# Patient Record
Sex: Female | Born: 1955 | Race: White | Hispanic: No | Marital: Married | Smoking: Never smoker
Health system: Southern US, Community
[De-identification: ages and names within clinical notes are randomized; demographics above are authoritative.]

## PROBLEM LIST (undated history)

## (undated) DIAGNOSIS — N189 Chronic kidney disease, unspecified: Secondary | ICD-10-CM

## (undated) DIAGNOSIS — E669 Obesity, unspecified: Secondary | ICD-10-CM

## (undated) DIAGNOSIS — F32A Depression, unspecified: Secondary | ICD-10-CM

## (undated) DIAGNOSIS — F319 Bipolar disorder, unspecified: Secondary | ICD-10-CM

## (undated) DIAGNOSIS — F329 Major depressive disorder, single episode, unspecified: Secondary | ICD-10-CM

## (undated) DIAGNOSIS — K219 Gastro-esophageal reflux disease without esophagitis: Secondary | ICD-10-CM

## (undated) DIAGNOSIS — Z87440 Personal history of urinary (tract) infections: Secondary | ICD-10-CM

## (undated) DIAGNOSIS — M1990 Unspecified osteoarthritis, unspecified site: Secondary | ICD-10-CM

## (undated) HISTORY — PX: HERNIA REPAIR: SHX51

## (undated) HISTORY — PX: ABDOMINAL HYSTERECTOMY: SHX81

## (undated) HISTORY — PX: SHOULDER SURGERY: SHX246

## (undated) HISTORY — DX: Chronic kidney disease, unspecified: N18.9

## (undated) HISTORY — DX: Major depressive disorder, single episode, unspecified: F32.9

## (undated) HISTORY — DX: Gastro-esophageal reflux disease without esophagitis: K21.9

## (undated) HISTORY — DX: Depression, unspecified: F32.A

## (undated) HISTORY — DX: Bipolar disorder, unspecified: F31.9

## (undated) HISTORY — PX: APPENDECTOMY: SHX54

## (undated) HISTORY — DX: Obesity, unspecified: E66.9

## (undated) HISTORY — DX: Personal history of urinary (tract) infections: Z87.440

---

## 1999-06-01 ENCOUNTER — Inpatient Hospital Stay (HOSPITAL_COMMUNITY): Admission: RE | Admit: 1999-06-01 | Discharge: 1999-06-03 | Payer: Self-pay | Admitting: *Deleted

## 2004-05-23 ENCOUNTER — Ambulatory Visit: Payer: Self-pay | Admitting: Psychiatry

## 2004-07-27 ENCOUNTER — Ambulatory Visit: Payer: Self-pay | Admitting: Psychiatry

## 2004-09-21 ENCOUNTER — Ambulatory Visit: Payer: Self-pay | Admitting: Psychiatry

## 2004-12-07 ENCOUNTER — Ambulatory Visit: Payer: Self-pay | Admitting: Psychiatry

## 2005-01-01 ENCOUNTER — Ambulatory Visit (HOSPITAL_BASED_OUTPATIENT_CLINIC_OR_DEPARTMENT_OTHER): Admission: RE | Admit: 2005-01-01 | Discharge: 2005-01-01 | Payer: Self-pay | Admitting: Orthopedic Surgery

## 2005-02-06 ENCOUNTER — Ambulatory Visit: Payer: Self-pay | Admitting: Psychiatry

## 2005-04-03 ENCOUNTER — Ambulatory Visit: Payer: Self-pay | Admitting: Psychiatry

## 2005-06-19 ENCOUNTER — Ambulatory Visit: Payer: Self-pay | Admitting: Psychology

## 2005-06-28 ENCOUNTER — Ambulatory Visit: Payer: Self-pay | Admitting: Psychiatry

## 2005-09-04 ENCOUNTER — Ambulatory Visit: Payer: Self-pay | Admitting: Psychology

## 2005-12-04 ENCOUNTER — Ambulatory Visit (HOSPITAL_COMMUNITY): Payer: Self-pay | Admitting: Psychiatry

## 2006-03-26 ENCOUNTER — Ambulatory Visit (HOSPITAL_COMMUNITY): Payer: Self-pay | Admitting: Psychiatry

## 2006-06-25 ENCOUNTER — Ambulatory Visit (HOSPITAL_COMMUNITY): Payer: Self-pay | Admitting: Psychiatry

## 2006-06-27 ENCOUNTER — Ambulatory Visit: Payer: Self-pay | Admitting: Gastroenterology

## 2006-07-15 ENCOUNTER — Ambulatory Visit (HOSPITAL_COMMUNITY): Admission: RE | Admit: 2006-07-15 | Discharge: 2006-07-15 | Payer: Self-pay | Admitting: Gastroenterology

## 2006-07-15 ENCOUNTER — Ambulatory Visit: Payer: Self-pay | Admitting: Gastroenterology

## 2006-08-21 ENCOUNTER — Ambulatory Visit: Payer: Self-pay | Admitting: Gastroenterology

## 2006-11-12 ENCOUNTER — Ambulatory Visit (HOSPITAL_COMMUNITY): Payer: Self-pay | Admitting: Psychiatry

## 2007-01-09 ENCOUNTER — Ambulatory Visit (HOSPITAL_COMMUNITY): Payer: Self-pay | Admitting: Psychiatry

## 2007-04-08 ENCOUNTER — Ambulatory Visit (HOSPITAL_COMMUNITY): Payer: Self-pay | Admitting: Psychiatry

## 2007-07-08 ENCOUNTER — Ambulatory Visit (HOSPITAL_COMMUNITY): Payer: Self-pay | Admitting: Psychiatry

## 2007-10-07 ENCOUNTER — Ambulatory Visit (HOSPITAL_COMMUNITY): Payer: Self-pay | Admitting: Psychiatry

## 2008-01-01 ENCOUNTER — Ambulatory Visit (HOSPITAL_COMMUNITY): Payer: Self-pay | Admitting: Psychiatry

## 2008-03-23 ENCOUNTER — Ambulatory Visit (HOSPITAL_COMMUNITY): Payer: Self-pay | Admitting: Psychiatry

## 2008-06-20 ENCOUNTER — Emergency Department (HOSPITAL_COMMUNITY): Admission: EM | Admit: 2008-06-20 | Discharge: 2008-06-20 | Payer: Self-pay | Admitting: Emergency Medicine

## 2008-06-29 ENCOUNTER — Ambulatory Visit: Admission: RE | Admit: 2008-06-29 | Discharge: 2008-06-29 | Payer: Self-pay | Admitting: Gynecologic Oncology

## 2008-07-06 ENCOUNTER — Inpatient Hospital Stay (HOSPITAL_COMMUNITY): Admission: RE | Admit: 2008-07-06 | Discharge: 2008-07-09 | Payer: Self-pay | Admitting: Obstetrics and Gynecology

## 2008-07-06 ENCOUNTER — Encounter: Payer: Self-pay | Admitting: Gynecologic Oncology

## 2008-07-06 ENCOUNTER — Encounter (INDEPENDENT_AMBULATORY_CARE_PROVIDER_SITE_OTHER): Payer: Self-pay | Admitting: Obstetrics and Gynecology

## 2008-08-17 ENCOUNTER — Ambulatory Visit: Admission: RE | Admit: 2008-08-17 | Discharge: 2008-08-17 | Payer: Self-pay | Admitting: Gynecologic Oncology

## 2008-11-16 ENCOUNTER — Ambulatory Visit (HOSPITAL_COMMUNITY): Payer: Self-pay | Admitting: Psychiatry

## 2009-02-15 ENCOUNTER — Ambulatory Visit (HOSPITAL_COMMUNITY): Payer: Self-pay | Admitting: Psychiatry

## 2009-04-13 ENCOUNTER — Emergency Department (HOSPITAL_COMMUNITY): Admission: EM | Admit: 2009-04-13 | Discharge: 2009-04-13 | Payer: Self-pay | Admitting: Emergency Medicine

## 2009-04-13 ENCOUNTER — Ambulatory Visit: Payer: Self-pay | Admitting: *Deleted

## 2009-04-13 ENCOUNTER — Inpatient Hospital Stay (HOSPITAL_COMMUNITY): Admission: RE | Admit: 2009-04-13 | Discharge: 2009-04-20 | Payer: Self-pay | Admitting: *Deleted

## 2009-04-22 ENCOUNTER — Ambulatory Visit: Payer: Self-pay | Admitting: Cardiology

## 2009-04-22 ENCOUNTER — Inpatient Hospital Stay (HOSPITAL_COMMUNITY): Admission: EM | Admit: 2009-04-22 | Discharge: 2009-05-11 | Payer: Self-pay | Admitting: Emergency Medicine

## 2009-04-25 ENCOUNTER — Encounter (INDEPENDENT_AMBULATORY_CARE_PROVIDER_SITE_OTHER): Payer: Self-pay | Admitting: Internal Medicine

## 2009-04-27 ENCOUNTER — Ambulatory Visit: Payer: Self-pay | Admitting: Internal Medicine

## 2009-04-30 ENCOUNTER — Encounter: Payer: Self-pay | Admitting: Internal Medicine

## 2009-05-11 ENCOUNTER — Inpatient Hospital Stay (HOSPITAL_COMMUNITY): Admission: RE | Admit: 2009-05-11 | Discharge: 2009-05-30 | Payer: Self-pay | Admitting: *Deleted

## 2009-05-11 ENCOUNTER — Ambulatory Visit: Payer: Self-pay | Admitting: *Deleted

## 2009-05-31 ENCOUNTER — Ambulatory Visit (HOSPITAL_COMMUNITY): Payer: Self-pay | Admitting: Psychiatry

## 2009-06-21 ENCOUNTER — Ambulatory Visit (HOSPITAL_COMMUNITY): Payer: Self-pay | Admitting: Psychiatry

## 2009-06-28 ENCOUNTER — Ambulatory Visit (HOSPITAL_COMMUNITY): Payer: Self-pay | Admitting: Psychiatry

## 2009-07-04 ENCOUNTER — Ambulatory Visit (HOSPITAL_COMMUNITY): Payer: Self-pay | Admitting: Psychiatry

## 2009-07-18 ENCOUNTER — Ambulatory Visit (HOSPITAL_COMMUNITY): Payer: Self-pay | Admitting: Psychiatry

## 2009-07-19 ENCOUNTER — Ambulatory Visit (HOSPITAL_COMMUNITY): Payer: Self-pay | Admitting: Psychiatry

## 2009-08-01 ENCOUNTER — Ambulatory Visit (HOSPITAL_COMMUNITY): Payer: Self-pay | Admitting: Psychiatry

## 2009-08-30 ENCOUNTER — Ambulatory Visit (HOSPITAL_COMMUNITY): Payer: Self-pay | Admitting: Psychiatry

## 2009-09-23 ENCOUNTER — Ambulatory Visit (HOSPITAL_COMMUNITY): Payer: Self-pay | Admitting: Psychiatry

## 2009-10-07 ENCOUNTER — Ambulatory Visit (HOSPITAL_COMMUNITY): Payer: Self-pay | Admitting: Psychiatry

## 2009-10-27 ENCOUNTER — Ambulatory Visit (HOSPITAL_COMMUNITY): Payer: Self-pay | Admitting: Psychiatry

## 2009-12-27 ENCOUNTER — Ambulatory Visit (HOSPITAL_COMMUNITY): Payer: Self-pay | Admitting: Psychiatry

## 2010-02-28 ENCOUNTER — Ambulatory Visit (HOSPITAL_COMMUNITY): Payer: Self-pay | Admitting: Psychiatry

## 2010-04-25 ENCOUNTER — Ambulatory Visit (HOSPITAL_COMMUNITY): Payer: Self-pay | Admitting: Psychiatry

## 2010-05-23 ENCOUNTER — Ambulatory Visit (HOSPITAL_COMMUNITY): Payer: Self-pay | Admitting: Psychiatry

## 2010-07-27 ENCOUNTER — Ambulatory Visit (HOSPITAL_COMMUNITY): Payer: Self-pay | Admitting: Psychiatry

## 2010-08-24 ENCOUNTER — Ambulatory Visit (HOSPITAL_COMMUNITY): Payer: Self-pay | Admitting: Psychiatry

## 2010-09-19 ENCOUNTER — Ambulatory Visit (HOSPITAL_COMMUNITY)
Admission: RE | Admit: 2010-09-19 | Discharge: 2010-09-19 | Payer: Self-pay | Source: Home / Self Care | Attending: Psychiatry | Admitting: Psychiatry

## 2010-10-31 ENCOUNTER — Encounter (INDEPENDENT_AMBULATORY_CARE_PROVIDER_SITE_OTHER): Payer: PRIVATE HEALTH INSURANCE | Admitting: Psychiatry

## 2010-10-31 DIAGNOSIS — F319 Bipolar disorder, unspecified: Secondary | ICD-10-CM

## 2010-12-01 LAB — POCT I-STAT 3, ART BLOOD GAS (G3+)
Acid-Base Excess: 4 mmol/L — ABNORMAL HIGH (ref 0.0–2.0)
Acid-base deficit: 4 mmol/L — ABNORMAL HIGH (ref 0.0–2.0)
Bicarbonate: 19.4 mEq/L — ABNORMAL LOW (ref 20.0–24.0)
Bicarbonate: 19.5 mEq/L — ABNORMAL LOW (ref 20.0–24.0)
Bicarbonate: 27.3 mEq/L — ABNORMAL HIGH (ref 20.0–24.0)
Bicarbonate: 29.6 mEq/L — ABNORMAL HIGH (ref 20.0–24.0)
O2 Saturation: 87 %
O2 Saturation: 91 %
O2 Saturation: 93 %
O2 Saturation: 95 %
Patient temperature: 98.1
Patient temperature: 98.2
TCO2: 20 mmol/L (ref 0–100)
pCO2 arterial: 28.7 mmHg — ABNORMAL LOW (ref 35.0–45.0)
pCO2 arterial: 29.5 mmHg — ABNORMAL LOW (ref 35.0–45.0)
pCO2 arterial: 30.1 mmHg — ABNORMAL LOW (ref 35.0–45.0)
pCO2 arterial: 34.7 mmHg — ABNORMAL LOW (ref 35.0–45.0)
pCO2 arterial: 38.9 mmHg (ref 35.0–45.0)
pCO2 arterial: 40.4 mmHg (ref 35.0–45.0)
pH, Arterial: 7.366 (ref 7.350–7.400)
pH, Arterial: 7.418 — ABNORMAL HIGH (ref 7.350–7.400)
pH, Arterial: 7.42 — ABNORMAL HIGH (ref 7.350–7.400)
pH, Arterial: 7.427 — ABNORMAL HIGH (ref 7.350–7.400)
pH, Arterial: 7.456 — ABNORMAL HIGH (ref 7.350–7.400)
pH, Arterial: 7.489 — ABNORMAL HIGH (ref 7.350–7.400)
pO2, Arterial: 48 mmHg — ABNORMAL LOW (ref 80.0–100.0)
pO2, Arterial: 52 mmHg — ABNORMAL LOW (ref 80.0–100.0)
pO2, Arterial: 53 mmHg — ABNORMAL LOW (ref 80.0–100.0)
pO2, Arterial: 63 mmHg — ABNORMAL LOW (ref 80.0–100.0)
pO2, Arterial: 70 mmHg — ABNORMAL LOW (ref 80.0–100.0)
pO2, Arterial: 72 mmHg — ABNORMAL LOW (ref 80.0–100.0)

## 2010-12-01 LAB — BASIC METABOLIC PANEL
BUN: 13 mg/dL (ref 6–23)
BUN: 14 mg/dL (ref 6–23)
BUN: 17 mg/dL (ref 6–23)
BUN: 5 mg/dL — ABNORMAL LOW (ref 6–23)
BUN: 8 mg/dL (ref 6–23)
CO2: 23 mEq/L (ref 19–32)
CO2: 29 mEq/L (ref 19–32)
CO2: 30 mEq/L (ref 19–32)
CO2: 30 mEq/L (ref 19–32)
CO2: 31 mEq/L (ref 19–32)
Calcium: 7.8 mg/dL — ABNORMAL LOW (ref 8.4–10.5)
Calcium: 8.1 mg/dL — ABNORMAL LOW (ref 8.4–10.5)
Calcium: 9.5 mg/dL (ref 8.4–10.5)
Chloride: 103 mEq/L (ref 96–112)
Chloride: 104 mEq/L (ref 96–112)
Chloride: 106 mEq/L (ref 96–112)
Chloride: 106 mEq/L (ref 96–112)
Chloride: 109 mEq/L (ref 96–112)
Chloride: 110 mEq/L (ref 96–112)
Creatinine, Ser: 0.88 mg/dL (ref 0.4–1.2)
Creatinine, Ser: 0.92 mg/dL (ref 0.4–1.2)
Creatinine, Ser: 0.93 mg/dL (ref 0.4–1.2)
Creatinine, Ser: 0.99 mg/dL (ref 0.4–1.2)
Creatinine, Ser: 1.06 mg/dL (ref 0.4–1.2)
GFR calc Af Amer: >60 mL/min (ref >60)
GFR calc Af Amer: >60 mL/min (ref >60)
GFR calc Af Amer: >60 mL/min (ref >60)
GFR calc Af Amer: >60 mL/min (ref >60)
GFR calc Af Amer: >60 mL/min (ref >60)
GFR calc non Af Amer: 54 mL/min — ABNORMAL LOW (ref >60)
GFR calc non Af Amer: >60 mL/min (ref >60)
GFR calc non Af Amer: >60 mL/min (ref >60)
Glucose, Bld: 127 mg/dL — ABNORMAL HIGH (ref 70–99)
Glucose, Bld: 145 mg/dL — ABNORMAL HIGH (ref 70–99)
Glucose, Bld: 168 mg/dL — ABNORMAL HIGH (ref 70–99)
Potassium: 2.2 mEq/L — CL (ref 3.5–5.1)
Potassium: 2.9 mEq/L — ABNORMAL LOW (ref 3.5–5.1)
Potassium: 2.9 mEq/L — ABNORMAL LOW (ref 3.5–5.1)
Potassium: 3 mEq/L — ABNORMAL LOW (ref 3.5–5.1)
Potassium: 3.5 mEq/L (ref 3.5–5.1)
Potassium: 4.4 mEq/L (ref 3.5–5.1)
Sodium: 138 mEq/L (ref 135–145)
Sodium: 139 mEq/L (ref 135–145)
Sodium: 143 mEq/L (ref 135–145)
Sodium: 144 mEq/L (ref 135–145)
Sodium: 145 mEq/L (ref 135–145)

## 2010-12-01 LAB — CBC
HCT: 28.9 % — ABNORMAL LOW (ref 36.0–46.0)
HCT: 29 % — ABNORMAL LOW (ref 36.0–46.0)
HCT: 30.2 % — ABNORMAL LOW (ref 36.0–46.0)
HCT: 32.1 % — ABNORMAL LOW (ref 36.0–46.0)
Hemoglobin: 10.1 g/dL — ABNORMAL LOW (ref 12.0–15.0)
Hemoglobin: 10.1 g/dL — ABNORMAL LOW (ref 12.0–15.0)
Hemoglobin: 10.1 g/dL — ABNORMAL LOW (ref 12.0–15.0)
Hemoglobin: 10.8 g/dL — ABNORMAL LOW (ref 12.0–15.0)
Hemoglobin: 9.9 g/dL — ABNORMAL LOW (ref 12.0–15.0)
Hemoglobin: 9.9 g/dL — ABNORMAL LOW (ref 12.0–15.0)
MCHC: 35.2 g/dL (ref 30.0–36.0)
MCHC: 33.5 g/dL (ref 30.0–36.0)
MCHC: 33.6 g/dL (ref 30.0–36.0)
MCHC: 34 g/dL (ref 30.0–36.0)
MCHC: 34 g/dL (ref 30.0–36.0)
MCV: 89.8 fL (ref 78.0–100.0)
MCV: 91.2 fL (ref 78.0–100.0)
MCV: 91.3 fL (ref 78.0–100.0)
MCV: 91.4 fL (ref 78.0–100.0)
MCV: 91.4 fL (ref 78.0–100.0)
MCV: 91.6 fL (ref 78.0–100.0)
MCV: 92.1 fL (ref 78.0–100.0)
Platelets: 181 10*3/uL (ref 150–400)
Platelets: 191 10*3/uL (ref 150–400)
Platelets: 435 10*3/uL — ABNORMAL HIGH (ref 150–400)
Platelets: 597 10*3/uL — ABNORMAL HIGH (ref 150–400)
RBC: 3.2 MIL/uL — ABNORMAL LOW (ref 3.87–5.11)
RBC: 3.15 MIL/uL — ABNORMAL LOW (ref 3.87–5.11)
RBC: 3.17 MIL/uL — ABNORMAL LOW (ref 3.87–5.11)
RBC: 3.18 MIL/uL — ABNORMAL LOW (ref 3.87–5.11)
RBC: 3.18 MIL/uL — ABNORMAL LOW (ref 3.87–5.11)
RBC: 3.27 MIL/uL — ABNORMAL LOW (ref 3.87–5.11)
RBC: 3.28 MIL/uL — ABNORMAL LOW (ref 3.87–5.11)
RBC: 3.51 MIL/uL — ABNORMAL LOW (ref 3.87–5.11)
RDW: 13.5 % (ref 11.5–15.5)
RDW: 13.6 % (ref 11.5–15.5)
RDW: 13.8 % (ref 11.5–15.5)
RDW: 14.1 % (ref 11.5–15.5)
WBC: 18.1 10*3/uL — ABNORMAL HIGH (ref 4.0–10.5)
WBC: 12.8 10*3/uL — ABNORMAL HIGH (ref 4.0–10.5)
WBC: 13.4 10*3/uL — ABNORMAL HIGH (ref 4.0–10.5)
WBC: 13.9 10*3/uL — ABNORMAL HIGH (ref 4.0–10.5)
WBC: 14.2 10*3/uL — ABNORMAL HIGH (ref 4.0–10.5)
WBC: 14.4 10*3/uL — ABNORMAL HIGH (ref 4.0–10.5)
WBC: 14.7 10*3/uL — ABNORMAL HIGH (ref 4.0–10.5)
WBC: 6.7 10*3/uL (ref 4.0–10.5)

## 2010-12-01 LAB — GLUCOSE, CAPILLARY
Glucose-Capillary: 104 mg/dL — ABNORMAL HIGH (ref 70–99)
Glucose-Capillary: 110 mg/dL — ABNORMAL HIGH (ref 70–99)
Glucose-Capillary: 112 mg/dL — ABNORMAL HIGH (ref 70–99)
Glucose-Capillary: 119 mg/dL — ABNORMAL HIGH (ref 70–99)
Glucose-Capillary: 127 mg/dL — ABNORMAL HIGH (ref 70–99)
Glucose-Capillary: 132 mg/dL — ABNORMAL HIGH (ref 70–99)
Glucose-Capillary: 133 mg/dL — ABNORMAL HIGH (ref 70–99)
Glucose-Capillary: 137 mg/dL — ABNORMAL HIGH (ref 70–99)
Glucose-Capillary: 138 mg/dL — ABNORMAL HIGH (ref 70–99)
Glucose-Capillary: 154 mg/dL — ABNORMAL HIGH (ref 70–99)
Glucose-Capillary: 156 mg/dL — ABNORMAL HIGH (ref 70–99)
Glucose-Capillary: 166 mg/dL — ABNORMAL HIGH (ref 70–99)
Glucose-Capillary: 169 mg/dL — ABNORMAL HIGH (ref 70–99)

## 2010-12-01 LAB — COMPREHENSIVE METABOLIC PANEL
ALT: 39 U/L — ABNORMAL HIGH (ref 0–35)
ALT: 59 U/L — ABNORMAL HIGH (ref 0–35)
AST: 41 U/L — ABNORMAL HIGH (ref 0–37)
Albumin: 1.7 g/dL — ABNORMAL LOW (ref 3.5–5.2)
BUN: 12 mg/dL (ref 6–23)
CO2: 22 mEq/L (ref 19–32)
CO2: 29 mEq/L (ref 19–32)
Calcium: 9 mg/dL (ref 8.4–10.5)
Chloride: 121 mEq/L — ABNORMAL HIGH (ref 96–112)
Chloride: 104 mEq/L (ref 96–112)
Creatinine, Ser: 0.89 mg/dL (ref 0.4–1.2)
Creatinine, Ser: 0.85 mg/dL (ref 0.4–1.2)
Creatinine, Ser: 0.97 mg/dL (ref 0.4–1.2)
GFR calc Af Amer: >60 mL/min (ref >60)
GFR calc Af Amer: >60 mL/min (ref >60)
GFR calc non Af Amer: 60 mL/min — ABNORMAL LOW (ref >60)
Glucose, Bld: 108 mg/dL — ABNORMAL HIGH (ref 70–99)
Potassium: 3.8 mEq/L (ref 3.5–5.1)
Sodium: 139 mEq/L (ref 135–145)
Total Bilirubin: 0.5 mg/dL (ref 0.3–1.2)
Total Bilirubin: 0.4 mg/dL (ref 0.3–1.2)
Total Protein: 6.8 g/dL (ref 6.0–8.3)

## 2010-12-01 LAB — BLOOD GAS, ARTERIAL
Acid-Base Excess: 5.1 mmol/L — ABNORMAL HIGH (ref 0.0–2.0)
Acid-Base Excess: 5.2 mmol/L — ABNORMAL HIGH (ref 0.0–2.0)
Acid-base deficit: 2.8 mmol/L — ABNORMAL HIGH (ref 0.0–2.0)
Acid-base deficit: 3.8 mmol/L — ABNORMAL HIGH (ref 0.0–2.0)
Bicarbonate: 19.1 mEq/L — ABNORMAL LOW (ref 20.0–24.0)
Bicarbonate: 28.2 mEq/L — ABNORMAL HIGH (ref 20.0–24.0)
Bicarbonate: 28.4 mEq/L — ABNORMAL HIGH (ref 20.0–24.0)
FIO2: 40 %
FIO2: 40 %
FIO2: 0.21 %
MECHVT: 650 mL
O2 Saturation: 92.3 %
O2 Saturation: 94.8 %
O2 Saturation: 89.5 %
PEEP: 5 cmH2O
Patient temperature: 37
Patient temperature: 98.6
Patient temperature: 98.6
RATE: 12 resp/min
RATE: 18 resp/min
TCO2: 18 mmol/L (ref 0–100)
TCO2: 29.2 mmol/L (ref 0–100)
TCO2: 29.5 mmol/L (ref 0–100)
pCO2 arterial: 36.4 mmHg (ref 35.0–45.0)
pH, Arterial: 7.503 — ABNORMAL HIGH (ref 7.350–7.400)
pH, Arterial: 7.525 — ABNORMAL HIGH (ref 7.350–7.400)
pO2, Arterial: 61.3 mmHg — ABNORMAL LOW (ref 80.0–100.0)
pO2, Arterial: 53.7 mmHg — ABNORMAL LOW (ref 80.0–100.0)

## 2010-12-01 LAB — PNEUMOCYSTIS JIROVECI SMEAR BY DFA

## 2010-12-01 LAB — DIFFERENTIAL
Basophils Absolute: 0 10*3/uL (ref 0.0–0.1)
Basophils Relative: 0 % (ref 0–1)
Basophils Relative: 1 % (ref 0–1)
Eosinophils Absolute: 0.2 10*3/uL (ref 0.0–0.7)
Eosinophils Absolute: 0.3 10*3/uL (ref 0.0–0.7)
Eosinophils Relative: 1 % (ref 0–5)
Eosinophils Relative: 3 % (ref 0–5)
Lymphocytes Relative: 5 % — ABNORMAL LOW (ref 12–46)
Lymphocytes Relative: 37 % (ref 12–46)
Lymphs Abs: 0.9 10*3/uL (ref 0.7–4.0)
Lymphs Abs: 1.6 10*3/uL (ref 0.7–4.0)
Lymphs Abs: 2.5 10*3/uL (ref 0.7–4.0)
Monocytes Absolute: 0.5 10*3/uL (ref 0.1–1.0)
Monocytes Absolute: 0.9 10*3/uL (ref 0.1–1.0)
Monocytes Relative: 3 % (ref 3–12)
Monocytes Relative: 13 % — ABNORMAL HIGH (ref 3–12)
Monocytes Relative: 4 % (ref 3–12)
Neutro Abs: 16.7 10*3/uL — ABNORMAL HIGH (ref 1.7–7.7)
Neutro Abs: 3.1 10*3/uL (ref 1.7–7.7)
Neutrophils Relative %: 92 % — ABNORMAL HIGH (ref 43–77)
Neutrophils Relative %: 47 % (ref 43–77)
Neutrophils Relative %: 83 % — ABNORMAL HIGH (ref 43–77)

## 2010-12-01 LAB — BODY FLUID CELL COUNT WITH DIFFERENTIAL
Eos, Fluid: 0 %
Lymphs, Fluid: 4 %
Monocyte-Macrophage-Serous Fluid: 6 % — ABNORMAL LOW (ref 50–90)
Neutrophil Count, Fluid: 90 % — ABNORMAL HIGH (ref 0–25)

## 2010-12-01 LAB — VIRUS CULTURE

## 2010-12-01 LAB — CLOSTRIDIUM DIFFICILE EIA

## 2010-12-01 LAB — MAGNESIUM
Magnesium: 1.9 mg/dL (ref 1.5–2.5)
Magnesium: 2.5 mg/dL (ref 1.5–2.5)

## 2010-12-01 LAB — CULTURE, RESPIRATORY W GRAM STAIN

## 2010-12-01 LAB — CARDIAC PANEL(CRET KIN+CKTOT+MB+TROPI)
CK, MB: 0.4 ng/mL (ref 0.3–4.0)
Total CK: 55 U/L (ref 7–177)
Troponin I: 0.06 ng/mL (ref 0.00–0.06)

## 2010-12-01 LAB — HIV ANTIBODY (ROUTINE TESTING W REFLEX): HIV: NONREACTIVE

## 2010-12-01 LAB — VALPROIC ACID LEVEL: Valproic Acid Lvl: 78.4 ug/mL (ref 50.0–100.0)

## 2010-12-01 LAB — PHOSPHORUS
Phosphorus: 3.3 mg/dL (ref 2.3–4.6)
Phosphorus: 3.6 mg/dL (ref 2.3–4.6)

## 2010-12-01 LAB — HSV PCR
HSV 2 , PCR: NOT DETECTED
HSV, PCR: DETECTED

## 2010-12-01 LAB — BRAIN NATRIURETIC PEPTIDE
Pro B Natriuretic peptide (BNP): 135 pg/mL — ABNORMAL HIGH (ref 0.0–100.0)
Pro B Natriuretic peptide (BNP): 107 pg/mL — ABNORMAL HIGH (ref 0.0–100.0)

## 2010-12-02 LAB — COMPREHENSIVE METABOLIC PANEL
ALT: 24 U/L (ref 0–35)
AST: 33 U/L (ref 0–37)
Albumin: 1.7 g/dL — ABNORMAL LOW (ref 3.5–5.2)
Albumin: 3.4 g/dL — ABNORMAL LOW (ref 3.5–5.2)
Alkaline Phosphatase: 67 U/L (ref 39–117)
Alkaline Phosphatase: 81 U/L (ref 39–117)
Alkaline Phosphatase: 83 U/L (ref 39–117)
BUN: 12 mg/dL (ref 6–23)
BUN: 14 mg/dL (ref 6–23)
CO2: 20 mEq/L (ref 19–32)
CO2: 20 mEq/L (ref 19–32)
CO2: 24 mEq/L (ref 19–32)
Chloride: 104 mEq/L (ref 96–112)
Chloride: 116 mEq/L — ABNORMAL HIGH (ref 96–112)
Chloride: 122 mEq/L — ABNORMAL HIGH (ref 96–112)
Creatinine, Ser: 0.95 mg/dL (ref 0.4–1.2)
Creatinine, Ser: 1.6 mg/dL — ABNORMAL HIGH (ref 0.4–1.2)
GFR calc Af Amer: 41 mL/min — ABNORMAL LOW (ref >60)
GFR calc non Af Amer: 34 mL/min — ABNORMAL LOW (ref >60)
GFR calc non Af Amer: 48 mL/min — ABNORMAL LOW (ref >60)
GFR calc non Af Amer: >60 mL/min (ref >60)
Glucose, Bld: 118 mg/dL — ABNORMAL HIGH (ref 70–99)
Glucose, Bld: 95 mg/dL (ref 70–99)
Potassium: 2.9 mEq/L — ABNORMAL LOW (ref 3.5–5.1)
Potassium: 3.1 mEq/L — ABNORMAL LOW (ref 3.5–5.1)
Potassium: 4.3 mEq/L (ref 3.5–5.1)
Sodium: 137 mEq/L (ref 135–145)
Total Bilirubin: 0.4 mg/dL (ref 0.3–1.2)
Total Bilirubin: 0.5 mg/dL (ref 0.3–1.2)
Total Bilirubin: 0.7 mg/dL (ref 0.3–1.2)

## 2010-12-02 LAB — BLOOD GAS, ARTERIAL
Acid-base deficit: 3.8 mmol/L — ABNORMAL HIGH (ref 0.0–2.0)
Acid-base deficit: 3.9 mmol/L — ABNORMAL HIGH (ref 0.0–2.0)
Acid-base deficit: 7.9 mmol/L — ABNORMAL HIGH (ref 0.0–2.0)
Acid-base deficit: 8.2 mmol/L — ABNORMAL HIGH (ref 0.0–2.0)
Acid-base deficit: 8.2 mmol/L — ABNORMAL HIGH (ref 0.0–2.0)
Bicarbonate: 16 mEq/L — ABNORMAL LOW (ref 20.0–24.0)
Bicarbonate: 19.8 mEq/L — ABNORMAL LOW (ref 20.0–24.0)
FIO2: 40 %
FIO2: 45 %
FIO2: 45 %
MECHVT: 350 mL
MECHVT: 410 mL
MECHVT: 500 mL
MECHVT: 500 mL
MECHVT: 500 mL
MECHVT: 650 mL
O2 Saturation: 94.6 %
O2 Saturation: 95.8 %
O2 Saturation: 95.9 %
O2 Saturation: 97.3 %
O2 Saturation: 99.4 %
PEEP: 5 cmH2O
PEEP: 5 cmH2O
PEEP: 5 cmH2O
PEEP: 5 cmH2O
Patient temperature: 37
RATE: 12 resp/min
RATE: 14 resp/min
RATE: 14 resp/min
RATE: 16 resp/min
RATE: 16 resp/min
RATE: 24 resp/min
RATE: 24 resp/min
pCO2 arterial: 26.3 mmHg — ABNORMAL LOW (ref 35.0–45.0)
pCO2 arterial: 27.1 mmHg — ABNORMAL LOW (ref 35.0–45.0)
pCO2 arterial: 29.7 mmHg — ABNORMAL LOW (ref 35.0–45.0)
pCO2 arterial: 31.3 mmHg — ABNORMAL LOW (ref 35.0–45.0)
pCO2 arterial: 32.6 mmHg — ABNORMAL LOW (ref 35.0–45.0)
pCO2 arterial: 39 mmHg (ref 35.0–45.0)
pH, Arterial: 7.271 — ABNORMAL LOW (ref 7.350–7.400)
pH, Arterial: 7.361 (ref 7.350–7.400)
pH, Arterial: 7.379 (ref 7.350–7.400)
pO2, Arterial: 102 mmHg — ABNORMAL HIGH (ref 80.0–100.0)
pO2, Arterial: 66.3 mmHg — ABNORMAL LOW (ref 80.0–100.0)
pO2, Arterial: 81.2 mmHg (ref 80.0–100.0)
pO2, Arterial: 83 mmHg (ref 80.0–100.0)
pO2, Arterial: 83.8 mmHg (ref 80.0–100.0)

## 2010-12-02 LAB — DIFFERENTIAL
Band Neutrophils: 3 % (ref 0–10)
Basophils Absolute: 0 10*3/uL (ref 0.0–0.1)
Basophils Absolute: 0 10*3/uL (ref 0.0–0.1)
Basophils Absolute: 0 10*3/uL (ref 0.0–0.1)
Basophils Absolute: 0 10*3/uL (ref 0.0–0.1)
Basophils Absolute: 0 10*3/uL (ref 0.0–0.1)
Basophils Absolute: 0 10*3/uL (ref 0.0–0.1)
Basophils Relative: 0 % (ref 0–1)
Basophils Relative: 0 % (ref 0–1)
Basophils Relative: 0 % (ref 0–1)
Basophils Relative: 0 % (ref 0–1)
Basophils Relative: 0 % (ref 0–1)
Basophils Relative: 0 % (ref 0–1)
Basophils Relative: 0 % (ref 0–1)
Eosinophils Absolute: 0 10*3/uL (ref 0.0–0.7)
Eosinophils Absolute: 0 10*3/uL (ref 0.0–0.7)
Eosinophils Absolute: 0 10*3/uL (ref 0.0–0.7)
Eosinophils Absolute: 0 10*3/uL (ref 0.0–0.7)
Eosinophils Relative: 0 % (ref 0–5)
Eosinophils Relative: 0 % (ref 0–5)
Eosinophils Relative: 0 % (ref 0–5)
Lymphocytes Relative: 18 % (ref 12–46)
Lymphocytes Relative: 4 % — ABNORMAL LOW (ref 12–46)
Lymphocytes Relative: 4 % — ABNORMAL LOW (ref 12–46)
Metamyelocytes Relative: 0 %
Monocytes Absolute: 0.3 10*3/uL (ref 0.1–1.0)
Monocytes Absolute: 0.5 10*3/uL (ref 0.1–1.0)
Monocytes Absolute: 0.7 10*3/uL (ref 0.1–1.0)
Monocytes Absolute: 1 10*3/uL (ref 0.1–1.0)
Monocytes Relative: 6 % (ref 3–12)
Myelocytes: 0 %
Neutro Abs: 14.9 10*3/uL — ABNORMAL HIGH (ref 1.7–7.7)
Neutro Abs: 15.8 10*3/uL — ABNORMAL HIGH (ref 1.7–7.7)
Neutro Abs: 16.9 10*3/uL — ABNORMAL HIGH (ref 1.7–7.7)
Neutro Abs: 7.6 10*3/uL (ref 1.7–7.7)
Neutro Abs: 9.6 10*3/uL — ABNORMAL HIGH (ref 1.7–7.7)
Neutrophils Relative %: 84 % — ABNORMAL HIGH (ref 43–77)
Neutrophils Relative %: 90 % — ABNORMAL HIGH (ref 43–77)
Neutrophils Relative %: 93 % — ABNORMAL HIGH (ref 43–77)
Neutrophils Relative %: 93 % — ABNORMAL HIGH (ref 43–77)

## 2010-12-02 LAB — URINE MICROSCOPIC-ADD ON

## 2010-12-02 LAB — PROTIME-INR
Prothrombin Time: 14.4 seconds (ref 11.6–15.2)
Prothrombin Time: 16.3 seconds — ABNORMAL HIGH (ref 11.6–15.2)

## 2010-12-02 LAB — CARDIAC PANEL(CRET KIN+CKTOT+MB+TROPI)
CK, MB: 0.7 ng/mL (ref 0.3–4.0)
CK, MB: 0.7 ng/mL (ref 0.3–4.0)
CK, MB: 1.5 ng/mL (ref 0.3–4.0)
CK, MB: 2.6 ng/mL (ref 0.3–4.0)
CK, MB: 5 ng/mL — ABNORMAL HIGH (ref 0.3–4.0)
Relative Index: 2.3 (ref 0.0–2.5)
Relative Index: INVALID (ref 0.0–2.5)
Relative Index: INVALID (ref 0.0–2.5)
Relative Index: INVALID (ref 0.0–2.5)
Relative Index: INVALID (ref 0.0–2.5)
Total CK: 173 U/L (ref 7–177)
Total CK: 54 U/L (ref 7–177)
Total CK: 56 U/L (ref 7–177)
Total CK: 66 U/L (ref 7–177)
Troponin I: 0.04 ng/mL (ref 0.00–0.06)
Troponin I: 0.06 ng/mL (ref 0.00–0.06)
Troponin I: 0.08 ng/mL — ABNORMAL HIGH (ref 0.00–0.06)
Troponin I: 0.08 ng/mL — ABNORMAL HIGH (ref 0.00–0.06)
Troponin I: 0.09 ng/mL — ABNORMAL HIGH (ref 0.00–0.06)
Troponin I: 0.13 ng/mL — ABNORMAL HIGH (ref 0.00–0.06)
Troponin I: 0.17 ng/mL — ABNORMAL HIGH (ref 0.00–0.06)

## 2010-12-02 LAB — BRAIN NATRIURETIC PEPTIDE: Pro B Natriuretic peptide (BNP): 201 pg/mL — ABNORMAL HIGH (ref 0.0–100.0)

## 2010-12-02 LAB — URINALYSIS, ROUTINE W REFLEX MICROSCOPIC
Glucose, UA: NEGATIVE mg/dL
Ketones, ur: NEGATIVE mg/dL
Leukocytes, UA: NEGATIVE
Nitrite: NEGATIVE
Specific Gravity, Urine: >1.03 — ABNORMAL HIGH (ref 1.005–1.030)
pH: 5 (ref 5.0–8.0)

## 2010-12-02 LAB — BASIC METABOLIC PANEL
BUN: 13 mg/dL (ref 6–23)
BUN: 8 mg/dL (ref 6–23)
CO2: 15 mEq/L — ABNORMAL LOW (ref 19–32)
CO2: 24 mEq/L (ref 19–32)
Calcium: 7.4 mg/dL — ABNORMAL LOW (ref 8.4–10.5)
Calcium: 7.5 mg/dL — ABNORMAL LOW (ref 8.4–10.5)
Chloride: 106 mEq/L (ref 96–112)
Chloride: 118 mEq/L — ABNORMAL HIGH (ref 96–112)
Chloride: 122 mEq/L — ABNORMAL HIGH (ref 96–112)
Creatinine, Ser: 1.07 mg/dL (ref 0.4–1.2)
Creatinine, Ser: 1.13 mg/dL (ref 0.4–1.2)
Creatinine, Ser: 1.49 mg/dL — ABNORMAL HIGH (ref 0.4–1.2)
GFR calc Af Amer: 44 mL/min — ABNORMAL LOW (ref >60)
GFR calc Af Amer: >60 mL/min (ref >60)
GFR calc non Af Amer: 37 mL/min — ABNORMAL LOW (ref >60)
GFR calc non Af Amer: 54 mL/min — ABNORMAL LOW (ref >60)
Glucose, Bld: 99 mg/dL (ref 70–99)
Potassium: 3.3 mEq/L — ABNORMAL LOW (ref 3.5–5.1)
Potassium: 3.8 mEq/L (ref 3.5–5.1)
Potassium: 4.2 mEq/L (ref 3.5–5.1)
Sodium: 138 mEq/L (ref 135–145)
Sodium: 143 mEq/L (ref 135–145)
Sodium: 145 mEq/L (ref 135–145)
Sodium: 147 mEq/L — ABNORMAL HIGH (ref 135–145)

## 2010-12-02 LAB — RAPID URINE DRUG SCREEN, HOSP PERFORMED
Amphetamines: NOT DETECTED
Barbiturates: NOT DETECTED
Benzodiazepines: NOT DETECTED
Cocaine: NOT DETECTED
Opiates: NOT DETECTED
Tetrahydrocannabinol: NOT DETECTED

## 2010-12-02 LAB — APTT: aPTT: 50 seconds — ABNORMAL HIGH (ref 24–37)

## 2010-12-02 LAB — CBC
HCT: 27.7 % — ABNORMAL LOW (ref 36.0–46.0)
HCT: 29.7 % — ABNORMAL LOW (ref 36.0–46.0)
HCT: 43.8 % (ref 36.0–46.0)
Hemoglobin: 10.3 g/dL — ABNORMAL LOW (ref 12.0–15.0)
Hemoglobin: 10.6 g/dL — ABNORMAL LOW (ref 12.0–15.0)
Hemoglobin: 15.1 g/dL — ABNORMAL HIGH (ref 12.0–15.0)
Hemoglobin: 9.6 g/dL — ABNORMAL LOW (ref 12.0–15.0)
MCHC: 34 g/dL (ref 30.0–36.0)
MCHC: 34.4 g/dL (ref 30.0–36.0)
MCHC: 35.5 g/dL (ref 30.0–36.0)
MCV: 89.3 fL (ref 78.0–100.0)
MCV: 89.7 fL (ref 78.0–100.0)
MCV: 89.7 fL (ref 78.0–100.0)
MCV: 90.3 fL (ref 78.0–100.0)
MCV: 90.3 fL (ref 78.0–100.0)
MCV: 90.8 fL (ref 78.0–100.0)
Platelets: 159 10*3/uL (ref 150–400)
Platelets: 183 10*3/uL (ref 150–400)
Platelets: 234 10*3/uL (ref 150–400)
RBC: 3.28 MIL/uL — ABNORMAL LOW (ref 3.87–5.11)
RBC: 3.4 MIL/uL — ABNORMAL LOW (ref 3.87–5.11)
RBC: 4.52 MIL/uL (ref 3.87–5.11)
RBC: 4.89 MIL/uL (ref 3.87–5.11)
RDW: 12.6 % (ref 11.5–15.5)
RDW: 12.9 % (ref 11.5–15.5)
WBC: 11.4 10*3/uL — ABNORMAL HIGH (ref 4.0–10.5)
WBC: 13.3 10*3/uL — ABNORMAL HIGH (ref 4.0–10.5)
WBC: 17.5 10*3/uL — ABNORMAL HIGH (ref 4.0–10.5)
WBC: 18.1 10*3/uL — ABNORMAL HIGH (ref 4.0–10.5)
WBC: 22.6 10*3/uL — ABNORMAL HIGH (ref 4.0–10.5)

## 2010-12-02 LAB — CULTURE, BLOOD (ROUTINE X 2)
Culture: NO GROWTH
Culture: NO GROWTH
Culture: NO GROWTH
Report Status: 9012010
Report Status: 9022010

## 2010-12-02 LAB — GLUCOSE, CAPILLARY
Comment 1: 0
Glucose-Capillary: 124 mg/dL — ABNORMAL HIGH (ref 70–99)
Glucose-Capillary: 126 mg/dL — ABNORMAL HIGH (ref 70–99)
Glucose-Capillary: 129 mg/dL — ABNORMAL HIGH (ref 70–99)
Glucose-Capillary: 130 mg/dL — ABNORMAL HIGH (ref 70–99)
Glucose-Capillary: 138 mg/dL — ABNORMAL HIGH (ref 70–99)
Glucose-Capillary: 138 mg/dL — ABNORMAL HIGH (ref 70–99)
Glucose-Capillary: 145 mg/dL — ABNORMAL HIGH (ref 70–99)
Glucose-Capillary: 162 mg/dL — ABNORMAL HIGH (ref 70–99)
Glucose-Capillary: 98 mg/dL (ref 70–99)

## 2010-12-02 LAB — ETHANOL: Alcohol, Ethyl (B): <5 mg/dL (ref 0–10)

## 2010-12-02 LAB — VANCOMYCIN, TROUGH: Vancomycin Tr: 5.8 ug/mL — ABNORMAL LOW (ref 10.0–20.0)

## 2010-12-02 LAB — MAGNESIUM: Magnesium: 1.6 mg/dL (ref 1.5–2.5)

## 2010-12-02 LAB — PHOSPHORUS
Phosphorus: 1.7 mg/dL — ABNORMAL LOW (ref 2.3–4.6)
Phosphorus: 1.8 mg/dL — ABNORMAL LOW (ref 2.3–4.6)
Phosphorus: 2.6 mg/dL (ref 2.3–4.6)
Phosphorus: 2.7 mg/dL (ref 2.3–4.6)

## 2010-12-02 LAB — CARBOXYHEMOGLOBIN
Methemoglobin: 1.8 % — ABNORMAL HIGH (ref 0.0–1.5)
O2 Saturation: 78.4 %
Total hemoglobin: 10.1 g/dL — ABNORMAL LOW (ref 12.5–16.0)

## 2010-12-02 LAB — CULTURE, RESPIRATORY W GRAM STAIN

## 2010-12-02 LAB — SALICYLATE LEVEL: Salicylate Lvl: <4 mg/dL (ref 2.8–20.0)

## 2010-12-02 LAB — TROPONIN I: Troponin I: 0.02 ng/mL (ref 0.00–0.06)

## 2010-12-02 LAB — CORTISOL: Cortisol, Plasma: 16.5 ug/dL

## 2010-12-02 LAB — LACTIC ACID, PLASMA: Lactic Acid, Venous: 0.8 mmol/L (ref 0.5–2.2)

## 2010-12-06 IMAGING — CR DG CHEST 1V PORT SAME DAY
1 series · 1 of 1 positions shown · non-contrast
Comparison: 04/22/2009 at 0855 hours.

CLINICAL DATA: Overdose.  Endotracheal tube placement.

PORTABLE CHEST - 1 VIEW SAME DAY

[view not recorded]
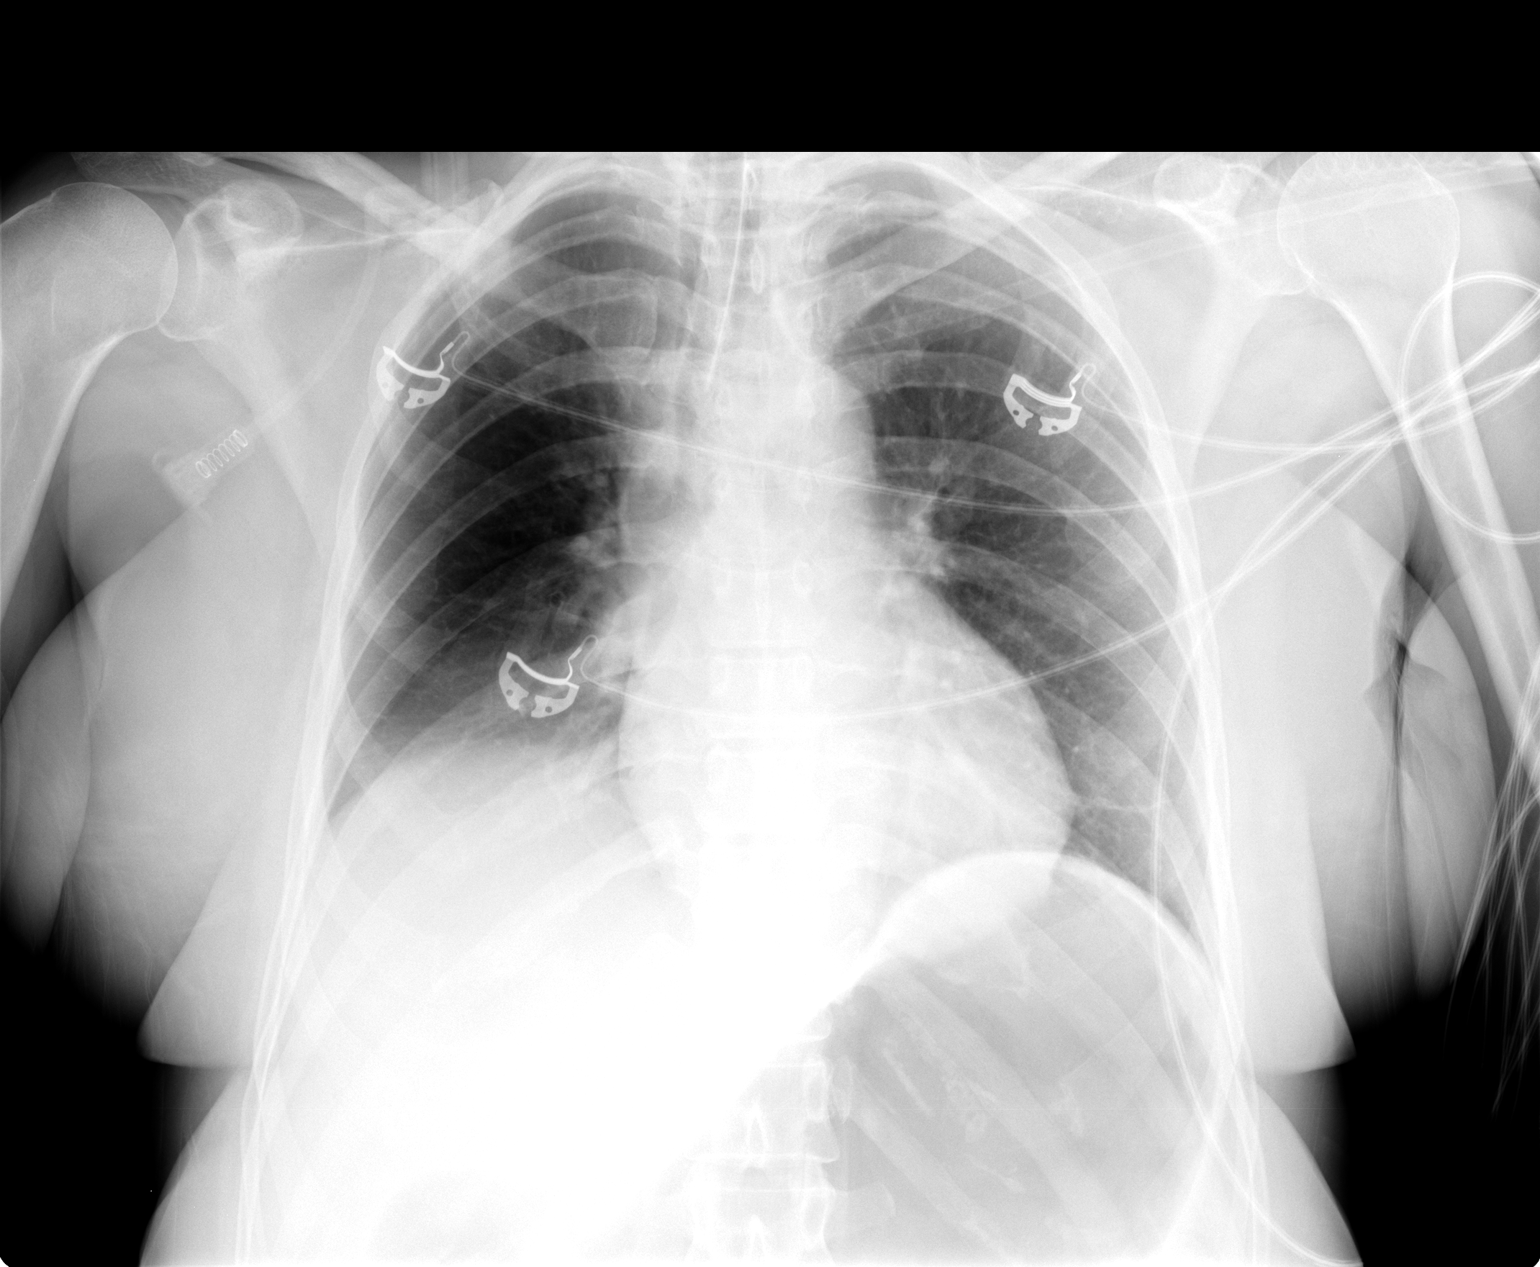

[1 of 1 positions shown; findings below may reference images not displayed]

FINDINGS: Endotracheal tube terminates approximately 2 cm above the
carina.  Heart size normal.  Lungs are somewhat low in volume.
There has been interval improvement in previously seen bibasilar
air space disease.
IMPRESSION: Interval improvement in bibasilar air space disease.

## 2010-12-07 IMAGING — CR DG CHEST 1V PORT
1 series · 1 of 1 positions shown · non-contrast
Comparison: 04/22/2009

CLINICAL DATA: Overdose, aspiration, intubated.

PORTABLE CHEST - 1 VIEW

[view not recorded]
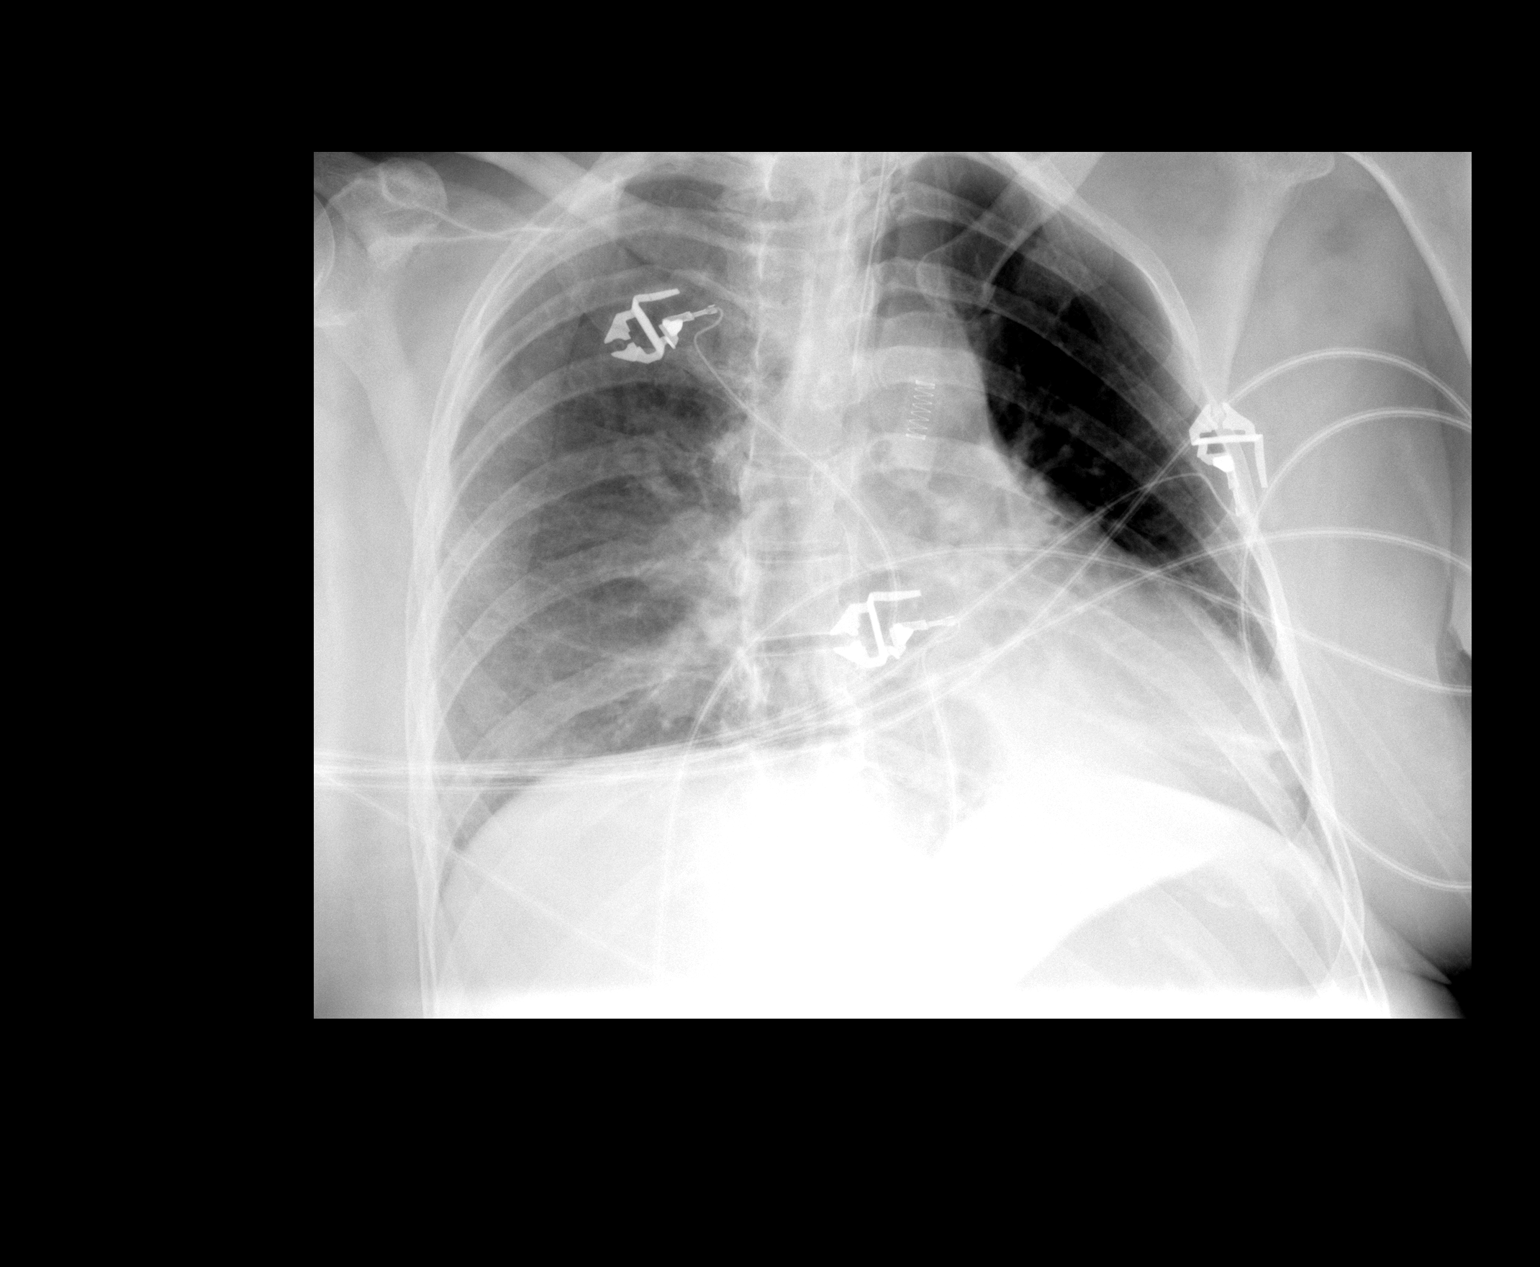

[1 of 1 positions shown; findings below may reference images not displayed]

FINDINGS: The patient is rotated.  Endotracheal tube terminates
approximately 2.5 cm above the carina.  Added density over the
right hemithorax may be due to patient rotation.  There is left
lower lobe air space disease.
IMPRESSION: 1.  Left lower lobe air space disease.
2.  Added density over the right hemithorax may be due to patient
rotation.

## 2010-12-08 IMAGING — CR DG CHEST 1V PORT
1 series · 1 of 1 positions shown · non-contrast
Comparison: 04/23/2009

CLINICAL DATA: Overdose

PORTABLE CHEST - 1 VIEW

[view not recorded]
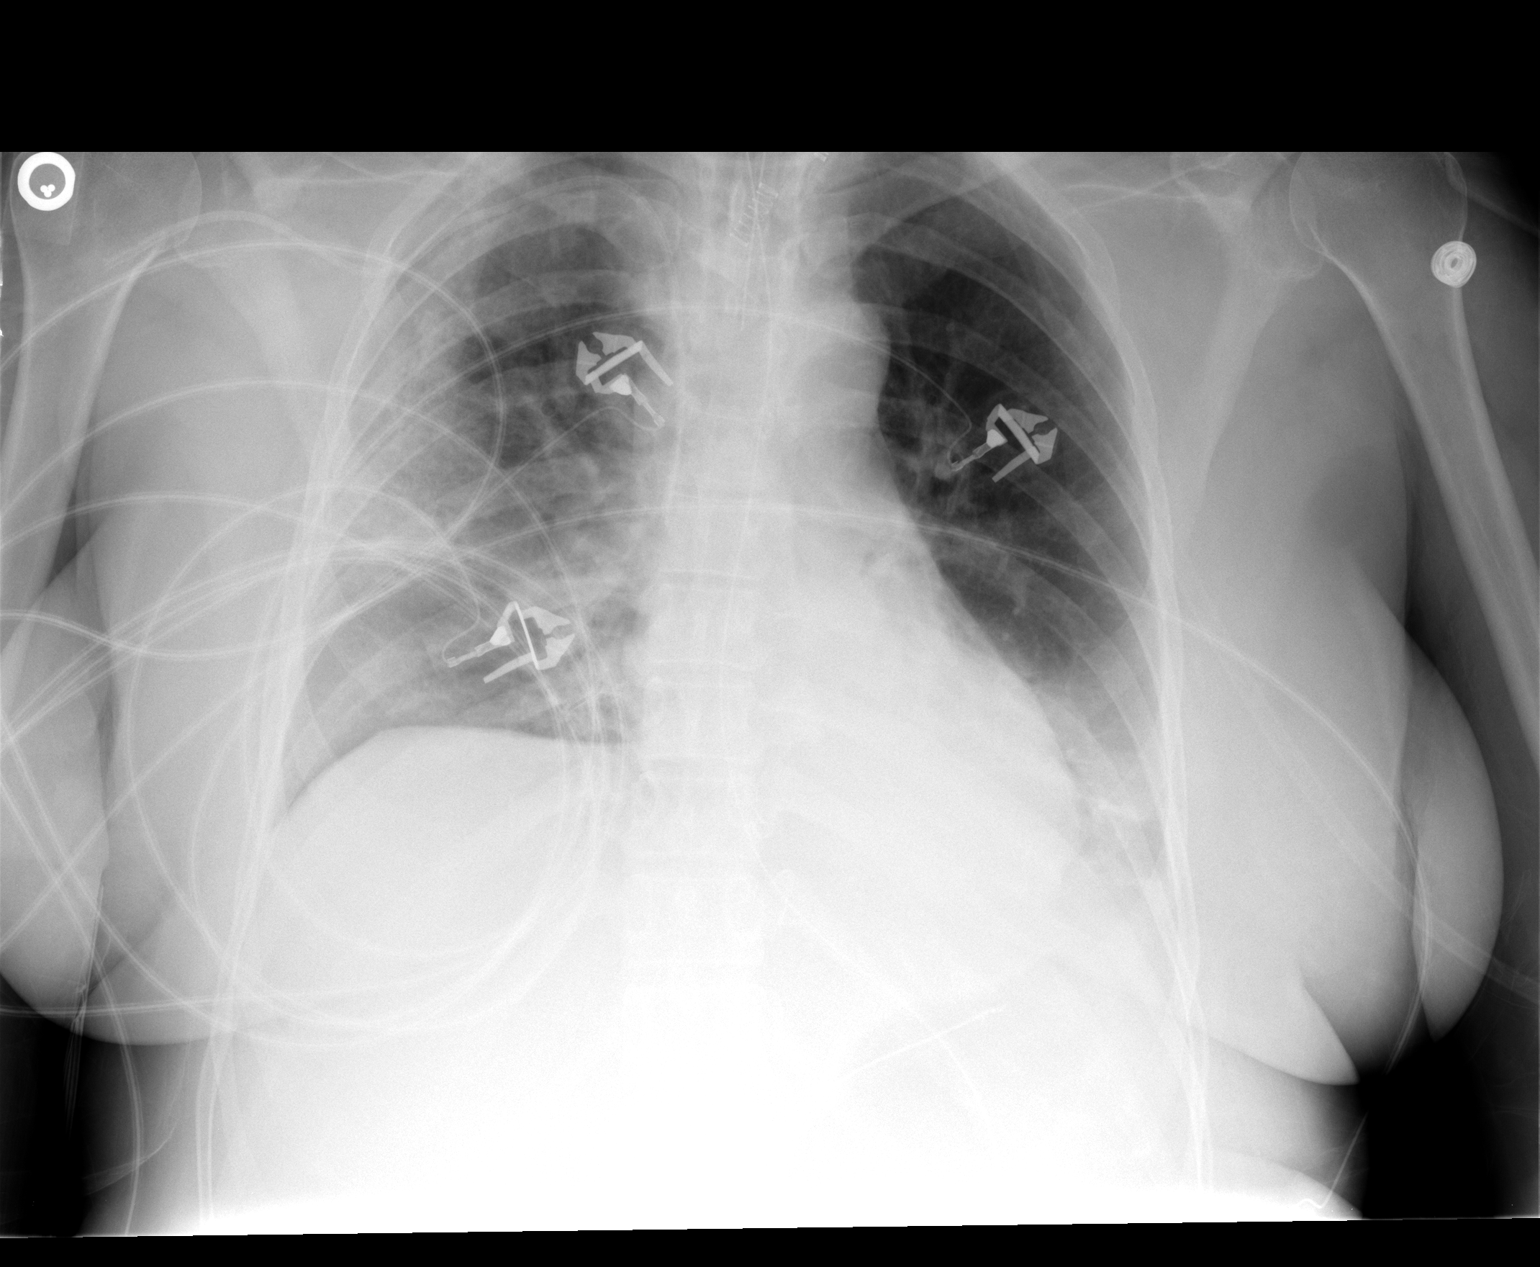

[1 of 1 positions shown; findings below may reference images not displayed]

FINDINGS: Left retrocardiac density is again appreciated.  It is
compatible with pneumonic consolidation and/or atelectasis.  Hazy
airspace disease involving the right lung is now appreciated.  ET
tube position satisfactory.  Central venous catheter is in the mid
SVC.  NG tube is in the proximal to mid stomach.
IMPRESSION: Left lower lobe are pneumonic consolidation/atelectasis.  New right
lung airspace disease consistent with pneumonia.

## 2010-12-09 IMAGING — CR DG CHEST 1V PORT
1 series · 1 of 1 positions shown · non-contrast
Comparison: 04/24/2009

CLINICAL DATA: , ventilator.

PORTABLE CHEST - 1 VIEW

[view not recorded]
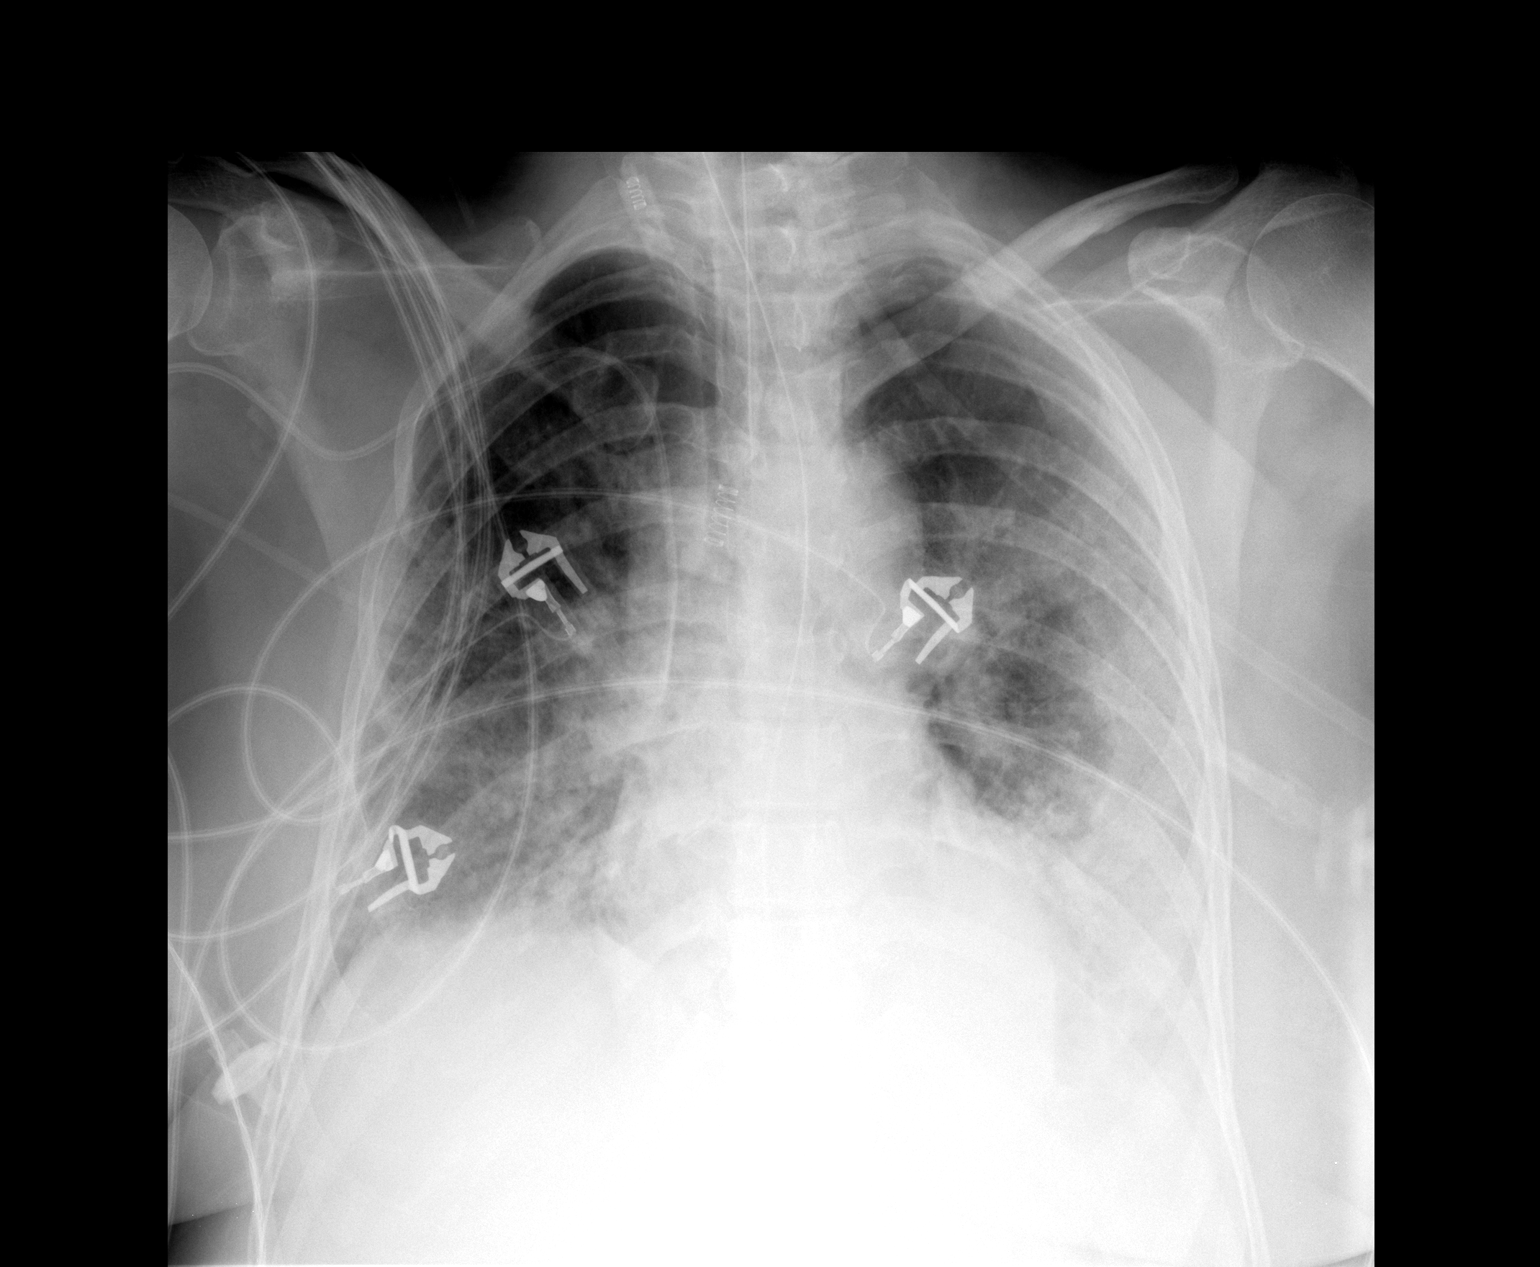

[1 of 1 positions shown; findings below may reference images not displayed]

FINDINGS: Worsening airspace disease bilaterally.  This presumably
represents edema.  Right central line, NG tube and endotracheal
tube are unchanged.  Possible small left effusion.  Possible left
effusion.
IMPRESSION: Worsening bilateral airspace disease.

## 2010-12-10 IMAGING — CR DG CHEST 1V PORT
1 series · 1 of 1 positions shown · non-contrast
Comparison: 04/25/2009

CLINICAL DATA: Follow-up chest.

PORTABLE CHEST - 1 VIEW

[view not recorded]
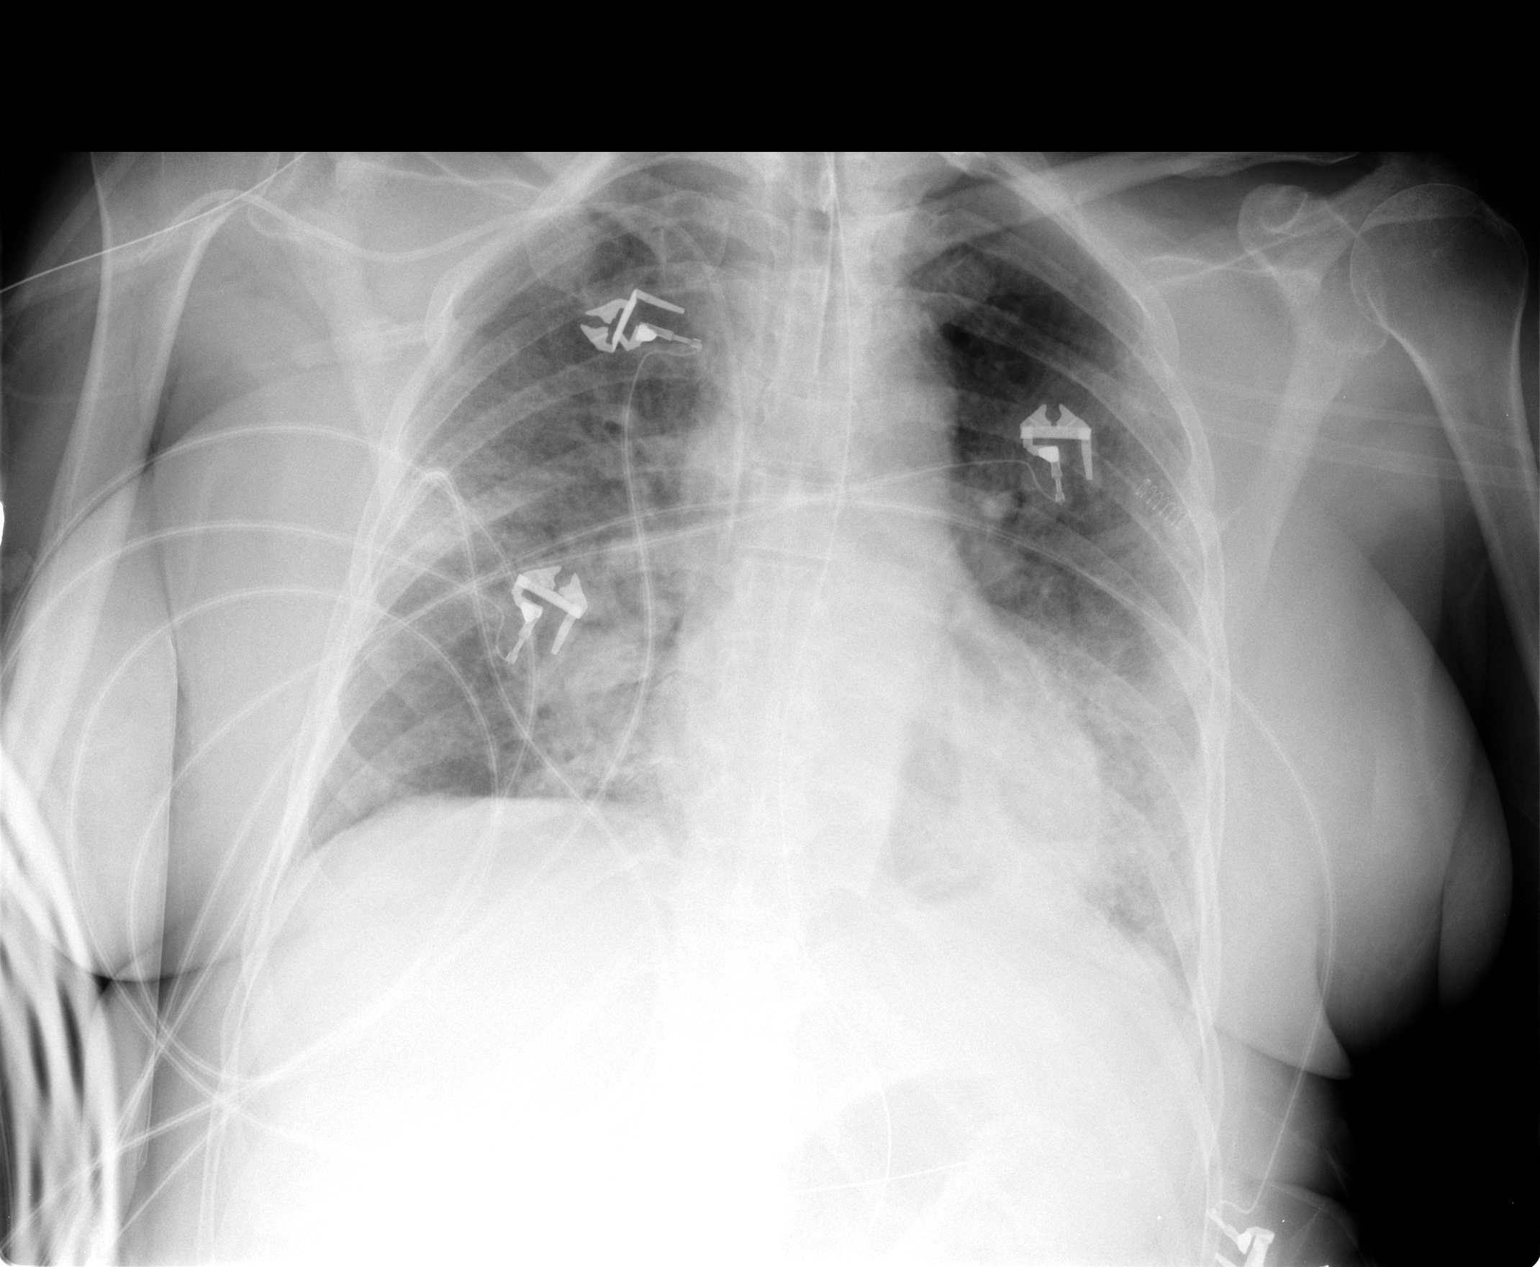

[1 of 1 positions shown; findings below may reference images not displayed]

FINDINGS: ET tube position satisfactory.  NG tube is traversing
esophagus and proximal to mid stomach.  Tube loops back on itself
with the tip located proximally in the stomach.  Central venous
catheter is in the mid SVC.  Airspace disease is again noted about
the same in degree allowing for technical differences.
IMPRESSION: Airspace disease persist.

## 2010-12-12 ENCOUNTER — Encounter (INDEPENDENT_AMBULATORY_CARE_PROVIDER_SITE_OTHER): Payer: PRIVATE HEALTH INSURANCE | Admitting: Psychiatry

## 2010-12-12 DIAGNOSIS — F39 Unspecified mood [affective] disorder: Secondary | ICD-10-CM

## 2011-01-09 NOTE — H&P (Signed)
Amanda Lara, Amanda Lara               ACCOUNT NO.:  [id number]   MEDICAL RECORD NO.:  [id number]          PATIENT TYPE:  INP   LOCATION:  IC03                          FACILITY:  [facility]   PHYSICIAN:  Skeet Latch, DO    DATE OF BIRTH:  06/21/1956   DATE OF ADMISSION:  04/22/2009  DATE OF DISCHARGE:  LH                              HISTORY & PHYSICAL   CHIEF COMPLAINT:  Drug overdose.   HISTORY OF PRESENT ILLNESS:  This is a 55 year old Caucasian female who  presents with apparent drug overdose.  The patient was sedated and  intubated.  History was obtained from the chart.  Apparently, the  patient was [facility] approximately 2 days ago  with diagnosis of major depressive disorder, severe psychotic symptoms.  The patient was sent home with Celexa 40 mg daily, Haldol 1 mg in the  morning and 2 mg at bedtime, Seroquel 600 mg at bedtime, and Restoril 15  mg at bedtime.  The patient presents today after apparently overdosing  on these medications and appears to be a suicide attempt.  The amounts  of Seroquel, Restoril, and Celexa are unknown at this time.  Apparently,  her husband was with her and she was brought to the emergency room.  According to the chart, the patient had a previous history of drug  overdose.  The patient was seen in the emergency room, was fairly  stable, began to become more somnolent and had trouble protecting her  airways, subsequently intubated and brought to the Intensive Care Unit.  The patient is currently stable.  She is sedated and intubated.   PAST MEDICAL HISTORY:  Depression and previous drug overdose.   PAST SURGICAL HISTORY:  Rotator cuff repair, hysterectomy, left ovary  removed, bone spurs removed from shoulder skin.   PAST SOCIAL HISTORY:  No history of smoking, alcohol, or illicit drug  use.   ALLERGIES:  REGLAN, CODEINE, and LATEX.   MEDICATIONS:  1. Celexa 40 mg daily.  2. Haldol 1 mg in the morning and 2 mg at  bedtime.  3. Seroquel 600 mg at bedtime.  4. Restoril 15 mg at bedtime.  5. Temazepam.   REVIEW OF SYSTEMS:  Unable to obtain secondary to the patient intubated.   PHYSICAL EXAMINATION:  VITAL SIGNS:  Heart rate 105, satting 99%, blood  pressure 105/64, respiratory rate 13, and temperature 97.3.  GENERAL:  She is a obtunded, well-developed, well-hydrated, and stable  at this time.  HEENT:  Head atraumatic and normocephalic.  Pupils sluggish.  They are  reactive to light and accommodation.  NECK:  Soft, supple.  HEART:  Regular rate and rhythm.  No murmurs, rubs, or gallops.  LUNGS:  Breath sounds are decreased.  There is some slight rhonchi noted  on right side.  No rales noted.  ABDOMEN:  Obese, soft, and nondistended.  Positive bowel sounds.  No  rigidity or guarding.  EXTREMITIES:  No clubbing, cyanosis, or edema.  NEUROLOGIC:  She is obtunded.  Cranial nerves II through XII are grossly  intact.  Good peripheral pulses.   LABORATORY  DATA:  ABG showed a pH 7.271, pCO2 of 39, pO2 67.8, and  bicarb 17.4.  Urine drug screen is positive for benzodiazepines.  White  count is 13.3, hemoglobin 14.0, hematocrit is 40.9, and platelet count  234,000.  Magnesium is 2.1, salicylate level less than 4, acetaminophen  level less than 10, alcohol level less than 5, sodium 137, potassium  4.3, chloride 104, CO2 is 20, glucose 141, BUN 12, and creatinine 1.60.  AST is 33, ALT is 24, total protein 7.2.   Chest x-ray shows interval improvement in bibasilar airspace disease.   ASSESSMENT AND PLAN:  This is a 55 year old Caucasian female who  presents after apparent drug overdose.  The patient was just released  from [facility] 2 days prior and placed on Celexa, Seroquel, and  Restoril, apparently took these drugs, intentional overdose.  The  patient was found positive for aspiration pneumonia, and her mental  status became altered.  The patient was subsequently intubated.  1. The patient  was admitted to the service of triad hospitalist.  2. The patient is in the intensive care unit and currently is      intubated.  We will continue mechanical ventilation at this time.      For her drug overdose, we will continue to monitor her vital signs,      labs, and get a repeat EKG in the morning.  3. For possible aspiration pneumonia, the patient will be placed on      broadspectrum antibiotics.  At this time, we will get a repeat      chest x-ray in the a.m.  4. When the patient is more stable and is extubated, we will need      another psychiatric evaluation, will probably require inpatient      treatment.  5. We will hold off p.o. medications at this time until the patient is      more alert.  She will be n.p.o. and on bedrest.  We will start deep      venous thrombosis as well as gastrointestinal prophylaxis.      Skeet Latch, DO     SM/MEDQ  D:  04/22/2009  T:  04/23/2009  Job:  [id number]   cc:   Kirstie Peri, MD  Fax: [phone]

## 2011-01-09 NOTE — Procedures (Signed)
NAMEKEI, LANGHORST               ACCOUNT NO.:  [id number]   MEDICAL RECORD NO.:  [id number]          PATIENT TYPE:  INP   LOCATION:  IC03                          FACILITY:  [facility]   PHYSICIAN:  Kofi A. Gerilyn Pilgrim, M.D. DATE OF BIRTH:  [birth year]   DATE OF PROCEDURE:  04/25/2009  DATE OF DISCHARGE:                              EEG INTERPRETATION   The patient is Jason Fila, who presents with encephalopathy,  unresponsiveness.   MEDICATIONS:  Protonix, Ventolin, Unasyn, Zofran, Tylenol, Levophed.  She also has had an overdose of Restoril, Haldol, Seroquel, and Celexa.   ANALYSIS:  This is a 16-channel recording that was conducted for  approximately 20 minutes.  The patient does have mostly theta background  activity, although with activation she does go up to an 8 or a 9.  Again, most of the recording there is theta activity.  She has  significant and increased spindles throughout the recording.  There are  also K complexes, indicating stage II sleep.  There is no focal slowing,  no lateralized slowing, no epileptiform activity.   IMPRESSION:  Increased amount of spindles.  This is likely an effect  from benzodiazepine.  She also has excess stage II sleep.  Otherwise  unremarkable recording.      Kofi A. Gerilyn Pilgrim, M.D.  Electronically Signed     KAD/MEDQ  D:  04/26/2009  T:  04/26/2009  Job:  [id number]

## 2011-01-09 NOTE — Discharge Summary (Signed)
NAMEKHANIYAH, BEZEK               ACCOUNT NO.:  [id number]   MEDICAL RECORD NO.:  [id number]          PATIENT TYPE:  IPS   LOCATION:  0306                          FACILITY:  [facility]   PHYSICIAN:  Jasmine Pang, M.D. DATE OF BIRTH:  12/10/1955   DATE OF ADMISSION:  04/13/2009  DATE OF DISCHARGE:  04/20/2009                               DISCHARGE SUMMARY   IDENTIFICATION:  This is a 55 year old married white female who was  admitted on a voluntary basis on April 13, 2009.   HISTORY OF PRESENT ILLNESS:  The patient reports feeling depressed and  suicidal.  She has been experiencing auditory hallucinations for the  past 2 weeks, voices are telling her to end it all.  The voices are also  telling her to get a gun.  The patient states it became very intense and  came for assessment of her symptoms.  She has not been sleeping well at  night.  Her appetite has been satisfactory.  The patient reports a  recent stressor and that she lost a good friend and an aunt recently.  She is also endorsing feelings of anxiety.  She denies any substance  use.  This is the first admission to [facility].  She has  been seeing Dr. Lolly Mustache for the past 2 years.  She overdosed years ago.  She denies any alcohol or substance abuse as indicated above.  For  further admission information, see psychiatric admission assessment.   PHYSICAL FINDINGS:  This is a middle-aged female, who was fully assessed  at [facility].  She appears somewhat disheveled, but in no  acute physical distress.  She offers no complaints today.  There were no  medical or physical problems noted.   DIAGNOSTIC STUDIES:  Urine drug screen was negative.  Her alcohol level  was less than 5.  Her BMET was within normal limits and her CBC showed a  hemoglobin of 11.1, minimally over the reference range of 15.   HOSPITAL COURSE:  The patient was started on Celexa 10 mg daily and  Seroquel 50 mg p.o. q.h.s., and Seroquel 50  mg p.o. q.6 h. p.r.n.  psychosis.  In individual sessions, the patient was initially and  casually dressed with fair eye contact.  There was psychomotor  retardation.  Speech was soft and slow.  Mood was depressed and anxious.  Affect was consistent with mood.  There was positive suicidal ideation.  No evidence of a thought disorder.  She was complaining of hearing  voices, telling her to kill herself.  As hospitalization progressed, the  patient continued to be depressed and anxious.  Positive suicidal  ideation.  She continued to have auditory hallucinations telling her to  kill herself.  She is having no side effects on her medications.  I  increased the Seroquel 100 mg p.o. q.h.s. and Restoril 15 mg p.o. q.h.s.  may repeat x1 p.r.n.  Also, Celexa was increased to 10 mg p.o. q.a.m.  and q.h.s.  On April 16, 2009, Seroquel was increased to 300 mg p.o.  q.h.s.  On April 18, 2009,  she was still depressed and anxious.  She  had positive suicidal ideation, shoot myself.  She had positive  auditory hallucinations, get rid of herself.  She discussed her  depression since the summer.  The voices had begun since about 2 weeks  ago.  Seroquel was increased to 300 mg p.o. q.h.s., then later increased  to 600 mg p.o. q.h.s.  On April 18, 2009, she was started on Haldol 1  mg p.o. q.a.m. and 2 mg at h.s. and Celexa was increased to 40 mg p.o.  daily.  As hospitalization progressed, mental status had improved.  On  April 20, 2009, sleep was good and appetite was good.  Mood was less  depressed and less anxious.  Affect was consistent with mood.  There was  no suicidal or homicidal ideation.  No thoughts of self-injurious  behavior.  No auditory or visual hallucinations.  No paranoia or  delusions.  Thoughts were logical and goal-directed.  The patient stated  she wanted to go home today.  Her husband was going to pick her up to  this afternoon.  She was felt to be safe for discharge.  She had no  side  effects.  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   DISCHARGE DIAGNOSES:  Axis I:  Major depressive disorder, severe with  psychotic symptoms.  Axis II:  None.  Axis III:  No known medical conditions.  Axis IV:  Moderate (other psychosocial problems related to recent  losses, burden of psychiatric illness).  Axis V:  Global assessment of functioning was 50 upon discharge.  GAF  was 30 upon admission.  GAF highest past year was 65-70.   DISCHARGE PLANS:  There were no specific activity level or dietary  restrictions.   POSTHOSPITAL CARE PLANS:  The patient will see Dr. Lolly Mustache on May 03, 2009 at 3 p.m.  She will see Dr. Kieth Brightly on April 29, 2009 at  9:40 a.m.   DISCHARGE MEDICATIONS:  1. Celexa 40 mg daily.  2. Haldol 1 mg in the morning and 2 mg at bedtime.  3. Seroquel 600 mg at bedtime.  4. Restoril 15 mg at bedtime.   She is also to talk to her doctor about her Premarin and Topamax.      Jasmine Pang, M.D.  Electronically Signed     BHS/MEDQ  D:  04/20/2009  T:  04/21/2009  Job:  [id number]

## 2011-01-09 NOTE — Op Note (Signed)
Amanda Lara, Amanda Lara               ACCOUNT NO.:  [id number]   MEDICAL RECORD NO.:  [id number]          PATIENT TYPE:  INP   LOCATION:  1527                         FACILITY:  [facility]   PHYSICIAN:  Paola A. Duard Brady, MD    DATE OF BIRTH:  23-Mar-1956   DATE OF PROCEDURE:  07/06/2008  DATE OF DISCHARGE:                               OPERATIVE REPORT   PREOPERATIVE DIAGNOSIS:  Pelvic mass, increased CA125.   POSTOPERATIVE DIAGNOSIS:  Endometriosis.   PROCEDURE:  1. Exploratory laparotomy.  2. Total abdominal hysterectomy, right salpingo-oophorectomy.  3. Right ureterolysis.  4. Removal of pelvic mass.   SURGEONS:  Paola A. Duard Brady, MD  Sherron Monday, MD   ASSISTANT:  Telford Nab, R.N.   ANESTHESIA:  General anesthesia   ANESTHESIOLOGIST:  Dr. Jean Rosenthal.   ESTIMATED BLOOD LOSS:  250 mL   URINE OUTPUT:  150 mL   IV FLUIDS:  2600 mL   DISPOSITION OF SPECIMENS:  To pathology.   OPERATIVE FINDINGS:  Included a hematosalpinx on the patient's right  side.  There is an 8 cm pelvic mass towards the patient's left causing  left-sided hydronephrosis, dense adhesive disease of the pelvic mass to  the rectosigmoid colon.  The rectosigmoid colon was adhesed to the  fundus of the uterus.   DESCRIPTION OF PROCEDURE:  The patient was taken to the operating room,  placed in supine position where general anesthesia was induced.  She was  then placed in dorsal lithotomy position with all the appropriate  precautions.  The abdomen was prepped in the usual sterile fashion.  The  perineum and vagina were prepped in the usual fashion.  A Foley catheter  was inserted into the bladder under sterile conditions.  Time-out was  performed to confirm the patient, allergies, antibiotics, procedures and  surgeons.  A vertical midline infraumbilical incision was made with a  knife and carried down to the underlying fascia using Bovie cautery.  The fascia was identified, scored in the midline and the  fascial  incision was extended superiorly and inferiorly.  The rectus bellies  were dissected off the overlying fascia.  The peritoneal cavity was  identified, tented, and entered sharply.  The peritoneal incision was  extended superiorly and inferiorly with visualization of the peritoneal  contents.  The bladder was dissected free.  The Bookwalter self-  retaining retractor was then placed on the bed with appropriate  precautions.  At this point and at several points during the case, we  ensured that there was no significant pressure of the lateral blades on  the psoas bellies.  Washings as above were noted, abdominal and pelvic  washings were obtained.   Our attention was first drawn to the patient's right side.  The round  ligament was transected with monopolar cautery, and the anterior and  posterior leafs of the broad ligament were opened.  There was a  significant on the retroperitoneal fibrosis on the patient's right side.  This was freed using careful sharp dissection.  The ureter was  identified and the pararectal space was opened on the patient's right  side.  We were then able to create a window between the IP and the  ureter.  The IP was clamped x2, transected and suture ligated.  We then  continued down inferiorly towards the pelvis where this abdominopelvic  mass was noted to be adherent to the rectosigmoid colon.  Adhesions of  the rectosigmoid colon were taken down using sharp dissection.  Using  blunt dissection with the surgeon's fingers, we were then able to free  the mass from its attachments, which were somewhat dense, but pliable,  off the patient's left-sided pelvic sidewall.  At this point we were  able to skeletonize the uterine artery on the patient's right side.  The  bladder flap was created.  There was dense fibrotic disease or adhesive  disease of the bladder to the surface of the cervix.  Once we were able  to skeletonize the uterine vessels, the uterine  vessels were clamped x2  with mattress and clamps, transected, and suture ligated with 0 Vicryl.   Our attention was then drawn to the patient's left side.  Due to her  prior surgery as well as the mass, there was significant distortion of  the anatomy.  The round ligament and the uterus were both pulled  inferiorly towards the rectosigmoid colon.  We transected the round  ligament and the anterior and posterior leafs of the broad ligament were  opened.  We identified the patient's ureter on her left side.  There was  no infundibulopelvic vessels to speak of.  We were then able to dissect  the ureter free of the peritoneal attachments on the medial aspect.  We  continued freeing the adhesive disease of the pelvic mass of the  rectosigmoid colon.  At this point the mass was ruptured and dark  chocolate fluid was extruded from the mass, approximately 100 mL.  This  decompression allowed [place] to notice that the fundus of the uterus was  adherent to the rectosigmoid colon, but the cul-de-sac was otherwise  free.  We were able to free out the adhesive disease of the rectosigmoid  colon to the fundus of the uterus using sharp dissection.  This allowed  [place] to restore more normal anatomy.  We were then able to skeletonize the  uterine artery on the patient's left side.  It was clamped with a  Masterson, transected and suture ligated.  We then continued mobilizing  the bladder toward the vagina.  The patient's left side was much less  adhesive and the patient's right side.  We continued down the cardinal  ligament with Masterson clamps.  Each pedicle was created, transected  and suture ligated.  We then entered the vagina on the patient's right  side.  Allis clamps were used to grasp the anterior and posterior lips  of the vagina.  The surgeon's finger was then placed in the vagina and  Bovie cautery along the surgeon's finger permitted [place] to identify the  last pedicle of the vaginal junction on the  patient's left side.  It was  then clamped with a Masterson clamp and the specimen consisting of  uterus and cervix was handed off.  The angle on the patient's left side  was suture ligated with a figure-of-eight suture of 0 Vicryl.  The  vaginal cuff was closed using figure-of-eight sutures of 0 Vicryl.  At  this point the mass was elevated free out of the pelvis.  It was sent  for frozen section.  Frozen section returned as endometriosis.  In the meantime, the large rind that was encompassing the rectosigmoid  colon, primarily on the patient's left side, we peeled it off easily off  the pelvic sidewall as well as the deep cul-de-sac.  There was an  approximately 2 x 2 cm plaque that remained on the rectosigmoid colon.  It would have required a rectosigmoid resection to remove it, and as  there is no evidence of malignancy, it remained in situ.  The abdomen  and pelvis were copiously irrigated.  There was a small amount of  bleeding on the posterior aspect of the cul-de-sac where the adhesive  disease and the rind from the endometrioma was removed.  Hemostasis was  obtained using figure-of-eight sutures of 0 Vicryl.  There was a very  small deserosalization of the rectosigmoid colon; it was reapproximated  with 2 figure-of-eight sutures of 3-0 silk.  Again, the abdomen and  pelvis was copiously irrigated and noted to be hemostatic.  All  laparotomy sponges were removed.  The Bookwalter retractor was removed.  The laparotomy count was correct.  The fascia was closed using running  mass closure of #1 PDS.  Subcu tissues were irrigated and made  hemostatic.  The skin was closed using skin clips.   The operative procedure had no complications.  All instrument, needle,  laparotomy and Ray-Tec counts were correct x2.      Paola A. Duard Brady, MD  Electronically Signed     PAG/MEDQ  D:  07/06/2008  T:  07/06/2008  Job:  [id number]   cc:   Sherron Monday, MD  Fax: [phone]   Kassie Mends,  M.D.  [address]  [place] , [place] [address]   Telford Nab, R.N.  [address]  [place], [place] [address]

## 2011-01-09 NOTE — Consult Note (Signed)
Amanda Lara, Amanda Lara               ACCOUNT NO.:  [id number]   MEDICAL RECORD NO.:  [id number]          PATIENT TYPE:  INP   LOCATION:  IC03                          FACILITY:  [facility]   PHYSICIAN:  Tilford Pillar, MD      DATE OF BIRTH:  02-11-1956   DATE OF CONSULTATION:  04/23/2009  DATE OF DISCHARGE:                                 CONSULTATION   REASON FOR CONSULTATION:  Hypertension requiring vasopressor  medications, medication overdose.   HISTORY OF PRESENT ILLNESS:  The patient is a 55 year old female who  apparently was recently released from a mental health rehabilitation  facility.  On returning home, she took just a large amount of  medication, she was found unresponsive, and was brought to [facility] for evaluation and management due to her mental status changes  and profound hypertension.  She was transferred to the intensive care  unit where she was intubated and continued on ventilatory support.  At  this time, the patient's chart was reviewed as well as discussed with  the patient's nurse that the patient remains unresponsive, intubated,  and on the vent.   Past medical and past surgical history are reviewed.   Medications are reviewed.  The patient is not on any anticoagulation.   FOCUSED PHYSICAL EXAMINATION:  VITAL SIGNS:  Reviewed.  The patient is  hypotensive and tachycardic.  GENERAL:  The patient is intubated and unresponsive.  NECK:  Trachea is midline.  No cervical lymphadenopathy.  Neck is  atraumatic.  No evidence of neck or upper shoulder or chest wall scars.  Clavicles are symmetrical bilaterally.  PULMONARY:  Full bilaterally.  EXTREMITIES:  Warm and dry.  She has 2+ radial pulses bilaterally.  A  line is noted within the left radial artery.   Pertinent laboratory and radiographic studies are reviewed.   ASSESSMENT AND PLAN:  Medication overdose, severe hypertension,  requiring vasopressor medications.  At this time, the patient does  require additional IV access for central monitoring due to the  increasing amount of blood pressure  medication required.  Risks,  benefits, and alternatives of placement are discussed with the family  and consent is obtained.  The patient has a left-sided radial artery  catheter.  This will be planned to be placed on the left subclavian  vein.   PROCEDURE:  At this time, the patient's left neck was prepped with  chlorhexidine solution, drapes were placed in standard fashion.  Local  anesthetic using 1% lidocaine was utilized to anesthetize the planned  course of venipuncture.  An 18-gauge introducer needle was utilized to  identify the right subclavian vein.  Good venous return was obtained.  A  J-wire was advanced, however, approximately 50% of the J-wire being  advanced suspicious for right internal jugular vein course.  At this  time __________ withdrawal, we did place the patient's right upper  extremity to inferior traction __________ to attempt to change  angulation __________ her right side, again advancement of the J-wire  was without event, so some more resistance was met feeling the course  was not going  to allow J-wire to be manipulated down to the superior  vena cava again having high suspicion that this is right internal  jugular course of the J-wire.  I then opted to remove the J-wire  introduction catheter.  The 18-gauge introducer needle pressure was  held, and at this time I did proceed more laterally with again hopes to  change the angulation of approach to the J-wire.  Again, good venous  return was obtained.  J-wire was advanced.  Similar assistance was met  and similar right upper extremity traction and head manipulation was  undertaken.  At this time, I did feel that careful J-wire manipulation  did have a better progression of the wire, did not need to obtain  resistance along the course of the J-wire.  At this time, using standard  Seldinger technique, a  triple-lumen catheter was advanced down the J-  wire.  The wire was removed.  All 3 ports were aspirated and flushed  without difficulty.  The catheter was then secured at 17.5 cm with silk  suture.  The skin was washed dry.  A Tegaderm dressing was placed for an  occlusive dressing.  The drapes were removed and stat portable chest x-  ray was obtained.  At the conclusion of the procedure, all sharps were  disposed in proper accordance.  The patient tolerated the procedure  well.  Stat portable chest x-ray did demonstrate proper position of the  triple-lumen catheter as well as no evidence of pneumothorax.  The  patient tolerated the procedure well.      Tilford Pillar, MD  Electronically Signed     BZ/MEDQ  D:  04/23/2009  T:  04/24/2009  Job:  [id number] [id number]

## 2011-01-09 NOTE — Group Therapy Note (Signed)
NAMEMELVINIA, ASHBY               ACCOUNT NO.:  [id number]   MEDICAL RECORD NO.:  [id number]          PATIENT TYPE:  INP   LOCATION:  IC03                          FACILITY:  [facility]   PHYSICIAN:  Edward L. Juanetta Gosling, M.D.DATE OF BIRTH:  01/13/1956   DATE OF PROCEDURE:  DATE OF DISCHARGE:  04/27/2009                                 PROGRESS NOTE   Ms. Cid remains intubated on the ventilator.  She is more alert.  She  has been switched from the ARDS protocol to standard ventilator protocol  because she had marked air hunger with the ARDS protocol.  She has no  new complaints and is much more.   Her exam this morning shows that her pulse rate is 116, blood pressure  148/52, 136/78 by art line, O2 sats in the high 90s, and her  respirations are about 17.  Her chest is much clearer than before, and  she looks better.   Her chloride is 121, glucose 140, alkaline phos is slightly elevated at  124, SGOT slightly elevated at 43, and her albumin is low at 1.7.  Her  CBC shows white count 18,100, hemoglobin 10.1, platelets 181, and blood  gas on 40%, 650, rate of 12, 5 of PEEP, shows pH 7.49, pCO2 of 26, and  pO2 68.5.   Chest x-ray shows worsening bilateral airspace disease.   ASSESSMENT:  She seems to be slightly better, but her chest x-ray looks  worse.  She is oxygenating okay.  She is more alert.   My plan would be to go ahead with seeing if she can wean, I am not sure  she is going to come off from the ventilator before they leave and make  an attempt and see where she is.  We may have to change her ventilator  protocol if she continues having worsening airspace disease, but she did  not tolerate the ARDS protocol well yesterday, once she became more  alert.  I think if we put her back on ARDS protocol, we are going to  have to sedate her heavily.      Edward L. Juanetta Gosling, M.D.  Electronically Signed     ELH/MEDQ  D:  04/27/2009  T:  04/27/2009  Job:  [id number]

## 2011-01-09 NOTE — Group Therapy Note (Signed)
Amanda Lara, Amanda Lara               ACCOUNT NO.:  [id number]   MEDICAL RECORD NO.:  [id number]          PATIENT TYPE:  INP   LOCATION:  IC03                          FACILITY:  [facility]   PHYSICIAN:  Melissa L. Ladona Ridgel, MD  DATE OF BIRTH:  07/16/1956   DATE OF PROCEDURE:  DATE OF DISCHARGE:                                 PROGRESS NOTE   CURRENT DIAGNOSES:  1. Ventilator dependent respiratory failure secondary to hypoxic      respiratory failure after a drug overdose with progression onto      pneumonia and acute respiratory distress syndrome.  The patient was      treated for sepsis and aspiration pneumonia over the weekend with      steroids, Unasyn and vancomycin, as well as initially pressors      which have now been weaned.  The patient was seen and evaluated by      her pulmonologist, Dr. Juanetta Gosling and distantly evaluated by our      critical care physician and started on Xigris over the weekend.      The patient's chest x-ray today has shown significant worsening,      possibly consistent with acute respiratory distress syndrome.      Clinically, she remains stented and I have reviewed the case with      Dr. Delton Coombes, who agrees to accept this patient for further treatment      with the ARDS protocol.  2. Pneumonia.  As stated, the patient is currently on Unasyn and      vancomycin.  There may be additional antibiotic coverage which      would be appropriate, including quinolones.  Critical Care team can      start as deemed necessary.  3. Sepsis.  The patient was started over the weekend on Xigris.  She      thus far has had no complications related to initiating this      therapy.  Currently, her white count remains elevated at 18,000.      The patient is on hydrocortisone 50 mg IV q.6, and is off pressor      agents.  Further progression along the sepsis protocol will be      appropriate after transfer to [facility], which would      include weaning her steroids and  considering broadening of her      antibiotic therapies.  4. Endocrine:  Her blood glucoses have been stable on hydrocortisone      and will continue with sliding scale insulin.  5. Obstipation.  I have recommended starting a Dulcolax suppository      p.r.n. daily if she has not moved her bowels.  6. Gastrointestinal:  The patient is currently on prophylactic IV      Protonix and is being fed via nasogastric tube.  7. Tachycardia which appears to be a reasonable physiological response      to her current stressors.  We will continue to monitor her closely.      Please note during the course of the hospital stay she has ruled  out for myocardial infarction.  A 2-D echo was obtained which      showed that her left ventricular size was within normal limits.      Her ejection fraction was 70-75%.  She has hyperdynamic systolic      function in the right ventricle.  Pulmonic valves were poorly      visualized.  8. Depression.  The patient is currently off all medications and when      she is medically stable will need to be evaluated by the [facility].  9. Neurologically, the patient has gone from very minimally responsive      to awake and able to follow commands.  Dr. Gerilyn Pilgrim was consulted      from neurology and the patient did undergo EEG testing.  The EEG      did show increased amount of spindles, likely from the      benzodiazepines effect.  A consideration was made for possible      dialysis, but this was not initiated.   DISCHARGE MEDICATIONS:  1. Currently, the patient is on Tylenol as needed as per rectum for      fever.  2. Ventolin 2.5 inhaled q.4 h., and q.2 h., p.r.n.  3. Unasyn 3 grams IV q.6 h.  4. She is maintained on appropriate oral care for trache, including      chlorhexidine and antiseptic mouth rinse.  5. She is currently on Xigris 20 mg IV, which is running at 9.4 mL per      hour and is due to be discontinued later today.  6. She is  currently on heparin 5000 units subcu q.8 h.  7. Hydrocortisone 50 mg IV q.6.  8. She is on a Versed drip and we may elect to initiate Diprivan if      she continues to be awake and alert on the ventilator.  9. She is currently off of all vasopressor agents, but was on      norepinephrine.  10.She is also on vancomycin per pharmacy protocol which at present is      1500 mg IV q.4 h.   HOSPITAL COURSE:  The patient is a 55 year old white female with a  history of depression who was discharged from [facility]  and 2 days later attempted suicide taking of cocktail of overdose,  including what I have been told but cannot confirm was Seroquel,  Restoril and Celexa.  The patient required intubation in the emergency  room secondary to altered mental status and acute respiratory failure.  The patient was treated with IV antibiotics for aspiration pneumonia.  Initially was placed on the quinolone and then subsequently changed to  Unasyn and vancomycin.  The patient's blood pressures evidently required  vasopressor support and it was felt that this patient likely had an  element of septic shock.  Therefore on April 22, 2009, under the  direction of the on-call E-link physician, Xigris was initiated.  This  had followed several IV fluid boluses and maximization of her  vasopressors with a change from dopamine to Levophed.  A subclavian IJ  central line was placed on March 28, 2009, by our surgical pain and  hydrocortisone was started.  The Avelox was discontinued at that time  and a CT head was obtained per the ICU physician's request, which showed  no evidence for intracranial hemorrhage.  The Lovenox was changed to  heparin subcu q.8 h., and the Xigris  was initiated per protocol.  On  April 24, 2009, the patient remained sedated, but stable with  reasonable blood pressures on norepinephrine.  The norepinephrine was  weaned over the course of the next 24 hours.  The patient was  seen by  our neurologist and had an EEG which showed significant spindle spikes,  consistent with marked toxic encephalopathy.  There was a query as to  whether hemodialysis would be appropriate, but this did not appear to be  supported and the patient did not require dialysis for removal of toxic  drugs.  By April 25, 2009, the patient still remained with minimal  interactions, but was more awake and moved both sides to painful  stimuli.  By April 26, 2009, she still reported as being poorly  responsive, however, on April 27, 2009, she was more awake, alert  and following commands.  Her chest x-ray, however, on April 27, 2009, showed worsening right-sided disease, consistent possibly with  ARDS.  Dr. Juanetta Gosling had been working with the patient and elected to  trial ARDS protocol, therefore the patient was resuscitated on Versed  and ARDS protocol was initiated with change in vent settings to [facility]  470, PEEP of 5, and 40%.  Her blood gas after 1 hour on these setting  was 7.479, pCO2 of 26, pO2 of 61 and a bicarb of 19.  The patient was  minimally tolerant of the ARDS protocol and, therefore, I communicated  with Dr. Juanetta Gosling and with our pulmonary critical care physicians who  were kind enough to accept the patient in transfer.   PERTINENT LABORATORY VALUES:  Blood cultures have remained negative.  As  stated, her last vent settings, PRVC 470, PEEP of 5 and 40%.  Blood gas  on these settings; 7.479, 26, 61 and 19.  The patient's white count is  18.1 with a hemoglobin of 10.1, hematocrit 28.7 and platelets of 181.  Cardiac markers have been negative.  Her sodium is 145, potassium 3.8,  chloride 121, CO2 of 22, glucose is 140, BUN is 12, creatinine 0.89.  Her LFTs reveal an elevated alkaline phosphatase, slightly elevated AST  at 43 and ALT is 28.  Total protein of 5.3, albumin of 4.7 and a calcium  of 7.9.  Respiratory cultures grew Candida albicans.  Her  last lactic  acidosis  was 0.8.  A random cortisol level obtained was 16.5.  Initial  lactate levels were 7 at their highest.  Urine drug screen on arrival  was positive for benzodiazepines.  Salicylate level was less than 4.  Alcohol level was less than 5.   At this time, the patient is deemed critically ill, but stable enough  for discharge to [facility] for further aggressive pulmonary,  critical care management of her respiratory failure and ARDS.  The  accepting physician is  Dr. Levy Pupa.  I have attempted to speak with her spouse and will  continue to try to reach him so that we can transfer her to [facility]  as soon as possible.   See accompanying written note.  Total time on this case was at least 120  min.      Melissa L. Ladona Ridgel, MD  Electronically Signed     MLT/MEDQ  D:  04/27/2009  T:  04/27/2009  Job:  [id number]
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
## 2011-01-09 NOTE — Group Therapy Note (Signed)
NAMEHAILE, Amanda Lara               ACCOUNT NO.:  [id number]   MEDICAL RECORD NO.:  [id number]          PATIENT TYPE:  INP   LOCATION:  IC03                          FACILITY:  [facility]   PHYSICIAN:  Edward L. Juanetta Gosling, M.D.DATE OF BIRTH:  22-Dec-1955   DATE OF PROCEDURE:  04/26/2009  DATE OF DISCHARGE:                                 PROGRESS NOTE   [name] is admitted with respiratory failure from a drug overdose.  She also has what appears to be aspiration pneumonia and yesterday chest  x-ray showed worsening airspace disease.  She has sepsis syndrome,  although she did not appear to have, we do not have a positive blood  culture.  Venous lactic acid yesterday was 0.8.  BNP was 201, although  she did show more bilateral infiltrates.  Her respiratory culture has no  growth.  In this morning, she is still sedated with no sedative  medications.  Remains intubated on the ventilator.  Her blood pressures  running in the 120s-130s, pulse about 110.  She is on the ARDS protocol  ventilation.  Her chest I think is a little bit clearer than yesterday.  Her heart is regular without gallop.  Her abdomen is soft.  She is on  tube feedings.  Her comp metabolic profile shows potassium 3.1, glucose  118.  Liver function was normal, but an albumin of 1.7.  She is on  fairly low dose of tube feedings, so we will need to bump that up I  think, cardiac panel and troponin has now gone down to 0.6.  CBC shows  white count 18,100, hemoglobin 9.6, platelets 172 and that compares to  white count of 22,000 yesterday.  On 45% tidal volume with 350, rate of  24, and 5 of PEEP, her pH is 7.41, pCO2 of 31, and pO2 of 83.  Her chest  x-ray is pending.   ASSESSMENT:  She has respiratory failure on the basis of a drug overdose  and she has aspiration pneumonia.  She had sepsis physiology, and has  been treated for that and appears to have largely resolved.  She remains  poorly responsive and Dr. Gerilyn Pilgrim, the  neurologist, who saw her  yesterday, felt it will take some time for the medications that she took  to clear.  He is going to investigate whether these could be dialyzed or  not, and whether it would be in [name]'s best interest to do that if  possible.  I do think she is slightly more awake than she was yesterday.  Otherwise, there has been essentially no change.  Her oxygenation is a  bit better and I will review chest x-ray when it is available.      Edward L. Juanetta Gosling, M.D.  Electronically Signed     ELH/MEDQ  D:  04/26/2009  T:  04/26/2009  Job:  [id number]

## 2011-01-09 NOTE — Consult Note (Signed)
NAMEDONNAMARIA, SHANDS               ACCOUNT NO.:  [id number]   MEDICAL RECORD NO.:  [id number]          PATIENT TYPE:  INP   LOCATION:  IC03                          FACILITY:  [facility]   PHYSICIAN:  Kofi A. Gerilyn Pilgrim, M.D. DATE OF BIRTH:  04-19-1956   DATE OF CONSULTATION:  04/25/2009  DATE OF DISCHARGE:                                 CONSULTATION   REASON FOR CONSULTATION:  Altered mental status from overdose.   The patient is a 55 year old white female who was recently admitted to  the psychiatric inpatient facility with a diagnosis of  severe psychotic  and depressive symptoms.  She was released with a full description  bottle of Celexa, Haldol, Seroquel and Restoril.  It appears that the  patient went home, had these filled and took all of the medicines were  recently prescribed in the bottle, leaving none.  She was found  unresponsive.  She was taken to the hospital where she has been found to  be quite somnolent and having problems protecting her airway.  She was  subsequently intubated and placed in the ICU.  She has been here for 3  days and has not had any improvement in cognition.  She has not been  sedated.  A neurological consultation was then obtained.   MEDICAL HISTORY:  Severe depression, psychosis, previous history of drug  overdose and suicide attempt.   PAST SURGICAL HISTORY:  Rotator cuff repair, hysterectomy, left  oophorectomy, bone spur removal from the shoulder.   SOCIAL HISTORY:  She is married, has supportive husband and family.  No  history of tobacco or alcohol use, no illicit drug use.   ALLERGIES:  REGLAN, CODEINE AND LATEX.   ADMISSION MEDICATIONS:  Celexa 40 mg, Haldol 1 mg in the morning and 2  at bedtime, Seroquel 600 mg at bedtime, Risperdal 50 mg at bedtime.   PHYSICAL EXAMINATION:  The patient is intubated.  She is not sedated, at  least no additional sedation has been given.  Temperature afebrile.  Pulse 101, blood pressure 112/65.  HEENT  EVALUATION:  Neck is supple.  Head is normocephalic.  ABDOMEN:  Soft.  EXTREMITIES:  She does have swelling and edema involving the right upper  extremity which has an IV placed.  MENTATION:  Eyes are closed.  She does open her eyes to deep painful  stimuli.  She does focus momentarily but closes her eyes rapidly.  She  is able to follow commands with repeated prompting and painful stimuli.  She shows indifference on the left side.  CRANIAL NERVE EVALUATION:  Pupils are 4 mm and briskly reactive.  Corneal reflexes are intact.  Oculocephalic reflexes and gag reflexes  are intact.  MOTOR:  Examination shows purposeful withdrawal to painful stimuli  bilaterally.  She tends to move the right upper extremity less so than  the left.  Reflexes are pathologically brisk throughout especially the  left side.  Plantars are equivocal.   SUPPORTIVE DATA:  Head CT scan of brain shows nothing acute.   ADMISSION LABS:  Sodium 143, potassium 4.3, chloride 104, CO2 28, BUN  12,  creatinine 1.6, glucose 140, calcium 9.7.  Acetaminophen,  salicylates and ethanol are all undetected.  WBC 30, hemoglobin 14,  platelet count 243.   ASSESSMENT:  Moderate encephalopathy due to toxic metabolic processes  from medication overdose day.  Did get a brief report from her ht EEG  which shows significant spindles typically seen from benzodiazepine  overdose or effect.   RECOMMENDATIONS:  Continue with supportive care.  The neuroleptics  probable will take a long time to clear, suspect several days, possibly  even a week of 2.  We may consider consulting with Dr. Kristian Covey to see  if these medications can be cleared with hemodialysis.  Will continue to  follow the patient with repeat serial neurological evaluation.  Thanks  for this consultation.      Kofi A. Gerilyn Pilgrim, M.D.  Electronically Signed     KAD/MEDQ  D:  04/26/2009  T:  04/26/2009  Job:  [id number]

## 2011-01-09 NOTE — Consult Note (Signed)
NAMESHEKETA, ENDE               ACCOUNT NO.:  [id number]   MEDICAL RECORD NO.:  [id number]          PATIENT TYPE:  OUT   LOCATION:  GYN                          FACILITY:  [facility]   PHYSICIAN:  Paola A. Duard Brady, MD    DATE OF BIRTH:  26-Feb-1956   DATE OF CONSULTATION:  08/17/2008  DATE OF DISCHARGE:                                 CONSULTATION   Ms. Rocca is a very pleasant 55 year old who was referred to [place] for a  large left adnexal mass measuring 5 x 7 cm that was causing left-sided  hydronephrosis and an elevated CA-125 at 197.  On July 06, 2008, she  underwent exploratory laparotomy, TAH RSO, right ureter lysis and  removal of a pelvic mass.  Final pathology was consistent with  endometriosis with serosal adhesions.  There was no atypia or malignancy  within the uterus.  She had a benign endometrial polyp with a  proliferative endometrium.  She had fibroids, she had benign  endocervical mucosa and no dysplasia.  The uterus serosa had  endometriosis and adhesions.  She did well from a postoperative  standpoint and comes in today for her postoperative check.  The biggest  complaint is the hot flashes primarily at night though she does have  them during the day.  They do wake her up.  She occasionally has  significant issues falling back asleep.  After our discussion of risks  and benefits, she would like to proceed with hormone replacement  therapy.  She states her energy level is very good.  She denies any  bleeding.  She had normal return of bowel and bladder function and  denies any complaints.   PHYSICAL EXAMINATION:  Weight 162 pounds which is down 3 pounds from her  preop visit.  Well-nourished, well-developed female in no acute  distress.  ABDOMEN:  Shows a well-healing vertical midline incision.  Abdomen is  soft and nontender.  PELVIC:  External genitalia is within normal limits.  Vagina is slightly  atrophic.  The vaginal cuff is visualized.  Sutures are still  seen.  Bimanual examination reveals no cuff tenderness or masses.   ASSESSMENT:  [age]year-old status post exploratory laparotomy TAH  RSO for benign disease consisting of endometriosis.  The patient is  doing very well from postoperative standpoint.  She does have some  vasomotor symptoms and hot flashes which are disabling to her and  causing her to be tired during the day as she is not able to sleep well  at night.  After our discussion of the risks and benefits she wishes to  proceed with hormone replacement therapy.  She was given a prescription  for Premarin 0.625 mg to take 1 p.o. daily.  She was encouraged to go to  her local emergency room and stop the medication should she develop any  symptoms of shortness of breath, chest pain,  calf tenderness or swelling.  Risks and benefits as stated above were  discussed with the patient and she wishes to proceed.  She will follow  up with Dr. Ellyn Hack for routine gynecology care.  I did  discuss with her  that over time we can wean her off her hormone replacement therapy.      Paola A. Duard Brady, MD  Electronically Signed     PAG/MEDQ  D:  08/17/2008  T:  08/17/2008  Job:  [id number]   cc:   Kassie Mends, M.D.  [address]  [place] , [place] [address]   Telford Nab, R.N.  [address]  [place], [place] [address]   Sherron Monday, MD  Fax: [phone]

## 2011-01-09 NOTE — H&P (Signed)
Amanda Lara, Amanda Lara               ACCOUNT NO.:  [id number]   MEDICAL RECORD NO.:  [id number]          PATIENT TYPE:  IPS   LOCATION:  0300                          FACILITY:  [facility]   PHYSICIAN:  Jasmine Pang, M.D. DATE OF BIRTH:  Feb 27, 1956   DATE OF ADMISSION:  04/13/2009  DATE OF DISCHARGE:                       PSYCHIATRIC ADMISSION ASSESSMENT   This is a 55 year old female voluntarily admitted on April 13, 2009.   HISTORY OF PRESENT ILLNESS:  The patient reports with feelings of  depression, feeling suicidal and experiencing auditory hallucinations  for the past 2 weeks.  The voices are telling her to end it all.  The  voices are also telling her to get a gun.  The patient states it became  very intense and came for assessment of her symptoms.  She has not been  sleeping well.  Her appetite has been satisfactory.  The patient reports  a recent stressor in that she lost a good friend and an aunt recently.  She also is endorsing feelings of anxiety.  Denies any substance use.   PAST PSYCHIATRIC HISTORY:  First admission to [facility].  Has been seeing Dr. Lolly Mustache for the past 2 years.  Overdosed years ago.   SOCIAL HISTORY:  This is a 55 year old married female who lives in [place].  She lives with her husband.  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   FAMILY HISTORY:  Brother who is on depression medicine.   ALCOHOL AND DRUG HISTORY:  Denies any alcohol or substance use.   PRIMARY CARE Amanda Sevcik:  Dr. Clelia Croft in [place].   MEDICAL PROBLEMS:  Denies any health issues.   MEDICATIONS:  Has been on Topamax 150 mg at h.s. and Premarin.  Has been  on Cymbalta in the past but stopped after her hysterectomy.  Did not  feel it was effective.   DRUG ALLERGIES:  REGLAN, WHICH SHE STATES SHE GETS NAUSEATED, CODEINE  AND LATEX, A SIDE EFFECT OF HIVES.   PHYSICAL EXAMINATION:  This is a middle-aged female, assessed at [facility].  She appears somewhat  disheveled but in no physical  distress.  She offers no complaints today.  Her urine drug screen was  negative.  Her alcohol level was less than 5.  Her BMET was within  normal limits, and her CBC showed a hemoglobin of 15.1, minimally over  the reference range of 15.   MENTAL STATUS EXAM:  The patient is fully alert and cooperative.  Fair  eye contact.  Again, she appears somewhat disheveled, currently wearing  hospital scrubs.  She seems sad.  Her speech is soft-spoken.  Answers  questions but does not elaborate on questions that were asked.  The  patient's mood is depressed, and again the patient appears sad and  depressed, somewhat anxious, wanting to get help.  Her thought processes  endorsing suicidal thoughts but promises safety.  Denies any current  auditory hallucinations.  Cognitive function intact.  Her memory appears  intact.  Judgment and insight appear to be good.  She appears sincere.   AXIS I:  Major depressive disorder, severe, with psychotic symptoms.  AXIS II:  Deferred.  AXIS III:  No known medical conditions.  AXIS IV:  Other psychosocial problems related to recent losses.  AXIS V:  Current is 30.   Our plan is to continue with the Topamax.  We discussed an  antidepressant and an antipsychotic.  We will discontinue the trazodone  as she states it has made her blood pressure drop in the past.  The  patient may benefit from some individual counseling or attend hospice  for her losses.  We will also have a family session with her husband if  the patient is agreeable.  We contacted Dr. Lolly Mustache per the patient's  wishes to let her know of her admission.   Tentative length of stay at this time is 3 to 5 days.      Landry Corporal, N.P.      Jasmine Pang, M.D.  Electronically Signed    JO/MEDQ  D:  04/14/2009  T:  04/14/2009  Job:  [id number]

## 2011-01-09 NOTE — Consult Note (Signed)
NAMEARELIA, VOLPE               ACCOUNT NO.:  [id number]   MEDICAL RECORD NO.:  [id number]          PATIENT TYPE:  INP   LOCATION:  IC03                          FACILITY:  [facility]   PHYSICIAN:  Edward L. Juanetta Gosling, M.D.DATE OF BIRTH:  1956/03/03   DATE OF CONSULTATION:  DATE OF DISCHARGE:                                 CONSULTATION   REASON FOR CONSULTATION:  Ventilator support.   HISTORY:  Ms. Christo is a 55 year old who came to the emergency with  apparent drug overdose.  She has already been sedated and intubated.  She had apparently been at the [facility], had been  discharged after having an episode of major depressive disorder with  psychosis and was sent home on Celexa, Haldol, Seroquel, and Restoril,  apparently took them all.  She has had previous episodes of drug  overdose by history.  She is still sedated with no intravenous sedation.  She has been started on Xigris per sepsis protocol.   Her past medical history is positive for depression, previous drug  overdoses, all this information is from her medical record.   Surgically, she has had rotator cuff repair, hysterectomy.  She has had  a left oophorectomy.  She has had some sort of bone spurs removed.   SOCIAL HISTORY:  She does not use tobacco, alcohol, or illicit drugs,  and she is apparently allergic to REGLAN, CODEINE, and LATEX.   REVIEW OF SYSTEMS:  Really unobtainable.   FAMILY HISTORY:  Again from the records, her mother had diabetes and  hypertension.  She has had 3 family members with colon cancer, and her  father has dementia and hypertension.   Physical exam shows that she is barely responsive.  Her blood pressure  now is in the 120/60 range, pulse 106, respirations 17, O2 sat is 93%.  Her pupils are reactive.  Her nose and throat are clear.  She is  intubated.  Her neck is supple without masses.  Her chest is clear  without wheezes, rales, or rhonchi.  Heart is regular without murmur,  gallop, or rub.  Her abdomen soft.  Extremities showed trace if any  edema.   LABORATORY WORK:  White count 16,000, hemoglobin is 10.3, platelets 159.  PT 16.7 with an INR of 1.4.  Blood gas on 40% is 500, rate of 14.  PH  7.36, pCO2 of 29, pO2 of 71.  Phosphorous was replaced yesterday but  still 1.7, so she is going to get phosphorous again.  Magnesium is 1.6.  Cardiac panel shows slightly elevated CK-MB at 5 and a troponin of 0.17.  Venous lactic acid 1.2 at 10 o'clock this morning, it was 7 last night,  so she has responded to early sepsis protocol.   Assessment then is that she has probably got a pneumonia from  aspiration.  She is overdosed.  She is sedated, and plan is to continue  with her current treatments and medications.  She is on good antibiotic  coverage.  I am going to switch her to the ARDS protocol with severe  sepsis.  Report shows high  probability of developing ARDS, and I will  continue with all of her other treatments.  She is going to get  phosphorus again.       Edward L. Juanetta Gosling, M.D.  Electronically Signed     ELH/MEDQ  D:  04/24/2009  T:  04/24/2009  Job:  [id number]

## 2011-01-09 NOTE — Group Therapy Note (Signed)
Amanda Lara, Amanda Lara               ACCOUNT NO.:  [id number]   MEDICAL RECORD NO.:  [id number]          PATIENT TYPE:  INP   LOCATION:  IC03                          FACILITY:  [facility]   PHYSICIAN:  Edward L. Juanetta Gosling, M.D.DATE OF BIRTH:  19-May-1956   DATE OF PROCEDURE:  DATE OF DISCHARGE:                                 PROGRESS NOTE   SUBJECTIVE:  Ms. Wiens is overall about the same.  She has no new  complaints.  She remains intubated and ventilated, and she is still  poorly responsive without any sedation.   Her physical exam shows that she is poorly responsive.  Pulse is about  112, blood pressure 130/69 by art-line, respirations 36.  She is on the  ARDS protocol now.   She continues on Xigris.  She continues on hydrocortisone.  She is on  Unasyn.  She is on vancomycin.   Her chest is with some rhonchi and she has a lot more secretions than  she did yesterday.  Her heart is regular without gallop.  Her abdomen is  soft.   LABORATORY WORK:  Potassium is 3.3, and she has orders to replace her  potassium now.  BUN 13, creatinine 0.97  Her CO2 is still low at 17.  BMET, phosphorus normal at 2.6.  Her cardiac panel still shows a  somewhat elevated troponin.  CBC shows white count 22,600, hemoglobin  10.3, platelets 183, and blood gas on 45%, tidal volume of 350, rate of  24, and 5 of PEEP shows pH 7.41, pCO2 of 28, pO2 of 66.   Chest x-ray shows worsening bilateral airspace disease.  I am going have  her go ahead and get a BNP added to her current laboratory work and to  get some Lasix this morning.  I am also going to have her get a venous  lactate.  I have gone ahead and ordered an echocardiogram as well.  She  should continue with her tube feeds, her other meds, and follow.      Edward L. Juanetta Gosling, M.D.  Electronically Signed     ELH/MEDQ  D:  04/25/2009  T:  04/25/2009  Job:  [id number]

## 2011-01-09 NOTE — Group Therapy Note (Signed)
Amanda Lara, Amanda Lara               ACCOUNT NO.:  [id number]   MEDICAL RECORD NO.:  [id number]          PATIENT TYPE:  INP   LOCATION:  IC03                          FACILITY:  [facility]   PHYSICIAN:  Kofi A. Gerilyn Pilgrim, M.D. DATE OF BIRTH:  12-28-1955   DATE OF PROCEDURE:  DATE OF DISCHARGE:                                 PROGRESS NOTE   SUBJECTIVE:  The temperature has increased to 101.1.  Her blood pressure  is 142/61 with a heart rate of 116.  The patient has improved from a  neurological standpoint.  She, however, seemed to have worsened on her x-  ray.  I did discuss with Dr. Juanetta Gosling.  He wants to put her back on ARDS  protocol which includes sedation.  She opens her eyes to name calling,  which is an improvement.  She follows commands more briskly.  Her pupils  are reactive.  Extraocular movements are intact.  Coronary reflexes are  intact.  She continues to have right upper extremity weakness of 2/5.  She has antigravity strength in all the other extremities.   ASSESSMENT AND PLAN:  1. Toxic metabolic encephalopathy, improving, due to an overdose.  2. Fever, likely due to aspiration pneumonia, worsened chest x-ray.      She will be sedated and placed on ARDS protocol.  3. 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  Continue to follow the patient.      Kofi A. Gerilyn Pilgrim, M.D.  Electronically Signed     KAD/MEDQ  D:  04/27/2009  T:  04/27/2009  Job:  [id number]

## 2011-01-09 NOTE — Consult Note (Signed)
Amanda Lara, ROSASCO               ACCOUNT NO.:  [id number]   MEDICAL RECORD NO.:  [id number]          PATIENT TYPE:  OUT   LOCATION:  GYN                          FACILITY:  [facility]   PHYSICIAN:  Paola A. Duard Brady, MD    DATE OF BIRTH:  May 25, 1956   DATE OF CONSULTATION:  06/29/2008  DATE OF DISCHARGE:                                 CONSULTATION   REASON FOR CONSULTATION:  The patient is seen today in consultation at  the request of Dr. Ellyn Hack.   HISTORY:  Ms. Tew is a 55 year old gravida 0 whose last cycle was  May 31, 2008.  She continues to have regular cycles, but they are  about every 30-40 days in length.  She denies any vasomotor symptoms.  She began having about a month ago some severe pain.  She felt like she  had to have a bowel movement all the time.  Since that time, the pain  has been getting progressively worse.  She subsequently went to [facility] Emergency Room on June 20, 2008.  At that time, she was noted to  have some dependent atelectasis at the lung bases.  She had severe left  hydroureter nephrosis extending into the anatomic pelvis with a delayed  nephrogram on the left-hand side.  Within the pelvis, she had a large  left adnexal mass measuring 5.3 x 6.5 cm and 7 cm in craniocaudal  dimension obstructing the left ureter.  Heterogeneous internal  enhancement worrisome for an ovarian neoplasm.  It closely approximated  the uterus, but was unlikely to represent an exophytic fibroid.  The  right ovary measured 3.4 x 1.9 cm of heterogenous attenuation.  They  could not exclude neoplastic involvement of the right ovary.  The mass  appeared to invade the left posterior aspect of the bladder wall.  There  was a normal appendix.  The mass had exophytic nodules extending off the  posterior aspect displacing and abutting the rectum with no rectovaginal  involvement.  There was no regional adenopathy.  The patient had a CA-  125 drawn by Dr. Emeline Darling office, but  we are not amendable to that  report.  The patient was given oxycodone which helped with her pain.  She subsequently was seen by Dr. Emeline Darling office on June 28, 2008.  At that time, an ultrasound was performed and interestingly revealed a  mass extending into the right adnexal region measuring approximately 6 x  4 x 5.4 cm.  They were not able to move the mass from the posterior wall  of the uterus.  There was low resistant blood flow seen.  There was no  evidence of any free fluid and they noted that the patient had a history  of a left salpingo-oophorectomy.  She denies any significant change in  her bowel or bladder habits, any nausea or vomiting.  She states she is  eating well.  She is not really sure if she has early satiety or not.  Her clothes are fitting quite well.  She denies any chest pain or  shortness of breath.   PAST  MEDICAL HISTORY:  1. Depression.  2. Migraines.  3. Mood disorder.   MEDICATIONS:  1. Cymbalta 60 mg daily.  2. Topamax 150 mg q.h.s.   PAST SURGICAL HISTORY:  1. She had a left salpingo-oophorectomy that was done laparoscopically      2 years ago for a benign ovarian cyst.  She states she also has      fibroids and they scraped the fibroids off.  2. She had some type of bowel injury which required repeat surgery and      was in the hospital for 11 days.  3. She has had bone spurs removed off her left and right shoulder.   ALLERGIES:  1. REGLAN which causes dystrophic changes.  2. CODEINE which causes nausea.   SOCIAL HISTORY:  She denies use of tobacco or alcohol.  She is married.  She is a Manufacturing systems engineer.   HEALTH MAINTENANCE:  She had a colonoscopy 2 years ago.  She is due for  a mammogram.  She had a Pap smear recently.   FAMILY HISTORY:  Mother had diabetes and hypertension.  Her maternal  uncles x2  and a maternal grandfather had colon cancer.  Her father has  dementia and hypertension.   PHYSICAL EXAMINATION:  VITAL SIGNS:  Height  5 feet 6 inches, weight 165  pounds, blood pressure 119/75, pulse 86.  GENERAL:  Well-nourished, well-developed female in no acute distress.  NECK:  Supple.  There is no lymphadenopathy, no thyromegaly.  LUNGS:  Clear to auscultation bilaterally.  CARDIOVASCULAR:  Regular rate and rhythm.  ABDOMEN:  She has well-healed laparoscopy skin incisions.  She has a  transverse incision over the right lower quadrant port site.  Abdomen is  diffusely tender.  She has no rebound or guarding.  The pain is  distractible.  There are no obvious palpable masses, though there is  voluntary guarding.  Groins are negative for adenopathy.  EXTREMITIES:  There is no edema.  PELVIC:  External genitalia is within normal limits.  On bimanual  examination, the patient has cervical motion tenderness and is  voluntarily guarding.  She does have a mass posterior to the uterus, but  it is difficult to ascertain the size of the mass due to the patient's  voluntary guarding.  Again, she does not have a rebound abdomen or an  acute surgical abdomen.  RECTOVAGINAL:  There is no nodularity, but this mass posterior to the  uterus between the uterus and the rectum is palpable at approximately 5-  6 cm.   ASSESSMENT:  A 55 year old with a large left adnexal mass by CT  measuring approximately 5 x 7 cm causing left-sided hydronephrosis.  There is no evidence of any extraovarian spread.  There is no ascites.  There is no lymphadenopathy.  I do not have benefit of a CA-125.   PLAN:  I had a lengthy discussion with the patient and her husband  regarding exploratory laparotomy, TAH-BSO, left ureterolysis.  I  discussed with them that the mass would be sent for frozen section and  appropriate staging would occur if this is a malignancy.  The patient is  tentatively scheduled for next Tuesday.  She does believe that she can  wait from a pain standpoint till next Tuesday as her pain has not gotten  significantly worse since she  went to the emergency room and in many  ways might be somewhat better.  Risks and benefits of surgery including,  but not limited to injury  to surrounding organs, blood transfusion,  infection, injury to bowel or bladder and thromboembolic disease were  discussed with the patient.  She does understand that this mass is  intimately involved with the left ureter and that could require  additional procedures.  The patient will take the responsibility of  getting her outside operative notes for [place] to review prior to her  surgery so we could see what was done both at the time a laparoscopy and  at the time of her exploratory laparotomy.  We will attempt to  coordinate surgery with Dr. Ellyn Hack.  However, if  Dr. Emeline Darling schedule does not permit, I believe the patient needs  surgery more acutely and we will need to proceed in her absence.  Again,  the patient's questions were elicited and answered to her satisfaction.  She will contact us if they have any question.      Paola A. Duard Brady, MD  Electronically Signed     PAG/MEDQ  D:  06/29/2008  T:  06/29/2008  Job:  [id number]   cc:   Sherron Monday, MD  Fax: [phone]   Telford Nab, R.N.  [address]  [place], [place] [address]   Kassie Mends, M.D.  [address]  [place] , [place] [address]

## 2011-01-09 NOTE — Group Therapy Note (Signed)
NAMEREFUGIO, MCCONICO               ACCOUNT NO.:  [id number]   MEDICAL RECORD NO.:  [id number]          PATIENT TYPE:  INP   LOCATION:  IC03                          FACILITY:  [facility]   PHYSICIAN:  Kofi A. Gerilyn Pilgrim, M.D. DATE OF BIRTH:  1955/09/30   DATE OF PROCEDURE:  DATE OF DISCHARGE:                                 PROGRESS NOTE   Kye Silverstein continues to be intubated.  No sedation has been given.  The patient lays in bed with eyes closed.  She opens her eyes to deep  painful stimuli.  She does follow commands on the left side.  Pupils are  5 mm and reactive.  Corneal reflexes are intact.  Ocucephalic reflexes  are intact.  She moved both sides to deep painful stimuli.  She does  move the right side less than the left, although the right upper  extremity is edematous.  Continues to have brisk reflexes, against more  on the left side.   LABORATORY DATA:  Labs from today; CPK 50, WBC 18, hemoglobin 9.6,  platelet count of 172, sodium 148, potassium 3.1, chloride 122, CO2 of  24, BUN 14, creatinine 0.8, glucose 118.  LFTs normal.   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  EEG shows evidence of increased spindles, particularly seen  from benzodiazepine overdose or affect.  We considered giving the  patient's flumazenil to reverse some of the benzodiazepine affect, but  this undoubtedly will trigger increased myoclonus and possible seizures.  There is some suggestion that she has myoclonus already.  Therefore, I  would suggest that we consult with Dr. Kristian Covey to see if we potentially  could remove some of these chemicals with hemodialysis.      Kofi A. Gerilyn Pilgrim, M.D.  Electronically Signed     KAD/MEDQ  D:  04/26/2009  T:  04/26/2009  Job:  [id number]

## 2011-01-12 NOTE — Consult Note (Signed)
Amanda Lara, Amanda Lara               ACCOUNT NO.:  [id number]   MEDICAL RECORD NO.:  [id number]          PATIENT TYPE:  AMB   LOCATION:  DAY                           FACILITY:  [facility]   PHYSICIAN:  Kassie Mends, M.D.      DATE OF BIRTH:  04/24/1956   DATE OF CONSULTATION:  06/27/2006  DATE OF DISCHARGE:                                   CONSULTATION   REASON FOR CONSULTATION:  Abdominal pain.   HISTORY OF PRESENT ILLNESS:  Amanda Lara is a 55 year old female who presents  with left lower quadrant pain since May.  She describes it as a sharp  stabbing in the left lower quadrant.  It is off and on.  It does not  radiate.  It occurs all at once.  It can wake her up in the middle of the  night.  It is relieved with Alleve.  It can become a nagging ache. It may  have had some relation to her menstrual cycles, so she initially saw her  primary physician.  She was referred to her OB/GYN for pelvic exam.  She was  worked up for a kidney stone.  There was a question whether or not this was  related to her gastrointestinal tract, and so she was referred to see me.  She has a family history of colon cancer and colon polyps in a second-degree  and third degree relative.  She denies any diarrhea.  She has irregular  bowel habits.  She may have 0-1 bowel movements a day.  The pain occurs less  than one time a month.  It may occur daily.  Nothing in particular makes it  worse.  She tried no other medicines for relief.  It gets better with Alleve  and time.  She denies any weight loss.  Appetite good.  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  She denies any nausea, vomiting, blood in her  stool or black tarry stools.   PAST MEDICAL HISTORY:  1. Migraine.  2. Depression.  3. Gastroesophageal reflux disease.   PAST SURGICAL HISTORY:  1. Right shoulder surgery for bone spurs.  2. Oral surgery.   ALLERGIES:  CODEINE,  REGLAN, LASIX, LITHIUM   MEDICATIONS:  1. Topamax 150 mg q.h.s.  2. Nortrel 1/35 daily.  3. Cymbalta 60 mg daily.  4. B complex vitamins daily.   FAMILY HISTORY:  She has a family history of dementia.  She has six living  sisters and two living brothers.  One deceased brother.   SOCIAL HISTORY:  She is married and has no children.  She denies any tobacco  or alcohol use.   REVIEW OF SYSTEMS:  Per the HPI, otherwise all systems are negative.   PHYSICAL EXAMINATION:  VITAL SIGNS:  Weight 165 pounds, height 5 feet 7  inches, BMI 25.8 (slightly overweight), temperature 93, blood pressure  122/80 pulse 56.  GENERAL:  She is in no apparent distress, alert and oriented x4.  HEENT:  Exam is atraumatic, normocephalic.  Pupils equal, react to light.  Mouth no oral lesions.  Posterior pharynx without erythema or exudate.  NECK:  Full range of motion.  No lymphadenopathy.  LUNGS:  Clear to auscultation bilaterally.  CARDIOVASCULAR:  Shows regular rhythm, no murmur was one S2.  ABDOMEN:  Bowel sounds present, soft, nontender, nondistended.  No rebound  or guarding.  No hepatosplenomegaly.  EXTREMITIES:  Without cyanosis, clubbing or edema.  NEUROLOGICAL:  She has no focal neurologic deficits.   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  She probably has  diverticulosis and impaired colonic motility.  She has a family history of  two brothers with colon polyps.  She has a remote family member with colon  cancer.  She has no alarm symptoms.  Thank you for allowing me to see Amanda Lara in consultation.  My recommendations follow.   RECOMMENDATIONS:  1. One tablespoon of Benefiber in the morning.  Begin high fiber diet.  2. Consider stool softener at night if one bowel movement a days is not      achieved.  She may also had MiraLax at noon to keep her stools soft but      not runny or watery.  3. She  will be scheduled for screening colonoscopy with an Osmo prep      within the next 3-4 weeks.  4. She is given a return patient visit in 3 months.      Kassie Mends, M.D.  Electronically Signed     SM/MEDQ  D:  06/27/2006  T:  06/28/2006  Job:  [id number]

## 2011-01-12 NOTE — Op Note (Signed)
Amanda Lara, YAHR               ACCOUNT NO.:  [id number]   MEDICAL RECORD NO.:  [id number]          PATIENT TYPE:  AMB   LOCATION:  DAY                           FACILITY:  [facility]   PHYSICIAN:  Kassie Mends, M.D.      DATE OF BIRTH:  Aug 07, 1956   DATE OF PROCEDURE:  07/15/2006  DATE OF DISCHARGE:                                 OPERATIVE REPORT   REFERRING PHYSICIAN:  Kirstie Peri, MD   PROCEDURE:  Colonoscopy.   INDICATION FOR EXAMINATION:  Ms. Court is a 55 year old female with two  brothers with colon polyps of unknown pathology.  She has left lower  quadrant abdominal pain and 0-1 bowel movement today.   FINDINGS:  1. Exquisite tenderness elicited with passing of the colonoscope through      the sigmoid colon.  Otherwise, no polyps, masses, inflammatory changes,      diverticular or vascular ectasia seen.  2. Normal retroflexed view of the rectum.   RECOMMENDATIONS:  1. Continue Benefiber and high fiber diet.  Hand-out given on high fiber      diet.  2. Add MiraLax once a day.  3. Follow up appointment with Dr. Cira Servant in four weeks.   MEDICATIONS:  1. Demerol 100 mg IV.  2. Versed 7 mg IV.   PROCEDURE TECHNIQUE:  Physical exam was performed and informed consent was  obtained from the patient after explaining the benefits, risks and  alternatives to the procedure.  The patient was connected to the monitor and  placed in the left lateral position.  Continuous oxygen was provided by  nasal cannula and IV medicine administered through an indwelling cannula.  After administration of sedation and rectal exam, the patient's rectum was  intubated  and the scope was advanced, under direct visualization, to the cecum.  The  scope was subsequently removed slowly by carefully examining the color,  texture, anatomy and integrity of the mucosa on the way out.  The patient  was recovered in endoscopy suite and discharged home in satisfactory  condition.      Kassie Mends, M.D.  Electronically Signed     SM/MEDQ  D:  07/15/2006  T:  07/15/2006  Job:  [id number]   cc:   Kirstie Peri, MD  Fax: [phone]

## 2011-01-12 NOTE — Op Note (Signed)
Amanda Lara, Amanda Lara               ACCOUNT NO.:  [id number]   MEDICAL RECORD NO.:  [id number]           PATIENT TYPE:   LOCATION:                                 FACILITY:   PHYSICIAN:  Elana Alm. Thurston Hole, M.D.      DATE OF BIRTH:   DATE OF PROCEDURE:  01/01/2005  DATE OF DISCHARGE:                                 OPERATIVE REPORT   PREOPERATIVE DIAGNOSES:  1.  Right shoulder adhesive capsulitis.  2.  Right shoulder partial labrum tear and partial rotator cuff tear.  3.  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   POSTOPERATIVE DIAGNOSES:  1.  Right shoulder adhesive capsulitis.  2.  Right shoulder partial labrum tear and partial rotator cuff tear.  3.  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   OPERATION PERFORMED:  1.  Right shoulder examination under anesthesia followed by manipulation.  2.  Right shoulder arthroscopic lysis of adhesions.  3.  Right shoulder debridement, partial labrum tear and partial rotator cuff      tear.  4.  Right shoulder subacromial decompression.  5.  Right shoulder distal clavicle excision.   SURGEON:  Elana Alm. Thurston Hole, M.D.   ASSISTANT:  Julien Girt, P.A.   ANESTHESIA:  General.   OPERATIVE TIME:  One hour.   COMPLICATIONS:  None.   INDICATIONS FOR PROCEDURE:  Amanda Lara is a 55 year old woman who has had  significant right shoulder pain for the past four to five month increasing  in nature with exam and MRI documenting adhesive capsulitis, partial rotator  cuff tear with impingement.  She has failed conservative care and is now to  undergo arthroscopy.   DESCRIPTION OF PROCEDURE:  Amanda Lara was brought to the operating room on  Jan 01, 2005 after an interscalene block was placed in the holding room by  anesthesia.  She was placed on the operating table in supine position.  After being placed under general anesthesia, initial range of motion showed  forward flexion to 90 degrees, abduction of 90 degrees,  internal-external  rotation of 40 degrees.  A gentle manipulation was carried out breaking up  adhesions and improving forward flexion to 175 degrees, abduction 175  degrees.  Internal-external rotation of 85 degrees.  The shoulder remained  stable to ligamentous exam.  She was then placed in a beach chair position  and her shoulder and arm were prepped using sterile DuraPrep and draped  using sterile technique.  Originally through a posterior arthroscopic  portal, the arthroscope with a pump attached was placed and through an  anterior portal, an arthroscopic probe was placed.  On initial inspection,  the articular cartilage in the glenohumeral joint was found to be intact.  The anterior labrum showed partial tearing 25% which was debrided.  Superior  labrum, biceps tendon anchor was intact.  Posterior labrum intact.  Inferior  labrum and anterior-inferior glenohumeral ligament complex was intact.  Posterior superior labrum showed partial tearing 20% and this was debrided.  Significant hemorrhagic synovitis was noted, secondary to the adhesive  capsulitis and this was partially debrided arthroscopically and  the synovial  bleeders were cauterized.  The rotator cuff was thoroughly inspected and  there was found to be no evidence of a tear.  The inferior capsular recess  free of pathology.  Subacromial space was entered and a lateral arthroscopic  portal was made.  A large amount of bursitis was removed and resected.  Underneath this, the rotator cuff was frayed and partially torn on the  bursal surface.  25% and this was debrided but no further tearing was noted.  Impingement was noted and the subacromial decompression was carried out  removing 6 to 8 mm of the undersurface of the anterior, anterolateral and  anteromedial acromion and CA ligament release carried out as well.  The [facility]  joint showed significant spurring and degenerative changes and the distal 5  to 6 mm of clavicle was resected  with a 6 mm bur.  After this was done, the  shoulder could be brought through a full range of motion with no impingement  on the rotator cuff.  At this point it was felt that all pathology had been  satisfactorily addressed.  The instruments were removed.  Portals closed  with 3-0 nylon suture.  The glenohumeral joint was injected with 80 mg of  Depo-Medrol and 10 mL of 0.25% Marcaine with epinephrine.  Sterile dressings  and a sling applied and the patient awakened and taken to recovery in stable  condition.   FOLLOW-UP CARE:  Amanda Lara will be followed as an outpatient on Vicodin for  pain with early physical therapy.  See her back in the office in a week for  sutures out and follow-up.      Robert A. Thurston Hole, M.D.  Electronically Signed     RAW/MEDQ  D:  10/02/2005  T:  10/02/2005  Job:  [id number]

## 2011-02-06 ENCOUNTER — Encounter (INDEPENDENT_AMBULATORY_CARE_PROVIDER_SITE_OTHER): Payer: PRIVATE HEALTH INSURANCE | Admitting: Psychiatry

## 2011-02-06 DIAGNOSIS — F39 Unspecified mood [affective] disorder: Secondary | ICD-10-CM

## 2011-04-02 ENCOUNTER — Other Ambulatory Visit (HOSPITAL_COMMUNITY): Payer: Self-pay | Admitting: Urology

## 2011-04-04 ENCOUNTER — Ambulatory Visit (HOSPITAL_COMMUNITY): Payer: PRIVATE HEALTH INSURANCE

## 2011-04-04 ENCOUNTER — Other Ambulatory Visit: Payer: Self-pay | Admitting: Urology

## 2011-04-04 ENCOUNTER — Other Ambulatory Visit: Payer: Self-pay | Admitting: Interventional Radiology

## 2011-04-04 ENCOUNTER — Ambulatory Visit (HOSPITAL_COMMUNITY)
Admission: RE | Admit: 2011-04-04 | Discharge: 2011-04-04 | Disposition: A | Discharge status: Home / Self Care | Payer: PRIVATE HEALTH INSURANCE | Source: Ambulatory Visit | Attending: Urology | Admitting: Urology

## 2011-04-04 DIAGNOSIS — R1909 Other intra-abdominal and pelvic swelling, mass and lump: Secondary | ICD-10-CM | POA: Insufficient documentation

## 2011-04-04 LAB — CBC
HCT: 39.3 % (ref 36.0–46.0)
MCH: 29.4 pg (ref 26.0–34.0)
MCHC: 33.6 g/dL (ref 30.0–36.0)
MCV: 87.5 fL (ref 78.0–100.0)
RDW: 13.7 % (ref 11.5–15.5)

## 2011-04-04 LAB — APTT: aPTT: 36 seconds (ref 24–37)

## 2011-04-10 ENCOUNTER — Encounter (INDEPENDENT_AMBULATORY_CARE_PROVIDER_SITE_OTHER): Payer: PRIVATE HEALTH INSURANCE | Admitting: Psychiatry

## 2011-04-10 DIAGNOSIS — F3189 Other bipolar disorder: Secondary | ICD-10-CM

## 2011-04-12 ENCOUNTER — Other Ambulatory Visit (HOSPITAL_COMMUNITY): Payer: PRIVATE HEALTH INSURANCE

## 2011-05-29 LAB — COMPREHENSIVE METABOLIC PANEL
ALT: 16
AST: 18
Albumin: 3.7
CO2: 25
Chloride: 107
GFR calc Af Amer: >60
GFR calc non Af Amer: 52 — ABNORMAL LOW
Sodium: 139
Total Bilirubin: 0.4

## 2011-05-29 LAB — CBC
HCT: 32.4 — ABNORMAL LOW
MCHC: 33.8
MCV: 89.4
MCV: 89.4
Platelets: 266
RBC: 3.62 — ABNORMAL LOW
RBC: 4.6
RDW: 13.3
WBC: 16 — ABNORMAL HIGH
WBC: 6.7
WBC: 9.2

## 2011-05-29 LAB — BASIC METABOLIC PANEL
BUN: 12
CO2: 22
Chloride: 111
Chloride: 110
Creatinine, Ser: 1.11
GFR calc Af Amer: >60
Potassium: 3.5

## 2011-05-29 LAB — DIFFERENTIAL
Basophils Absolute: 0
Basophils Relative: 0
Eosinophils Absolute: 0.1
Eosinophils Absolute: 0.2
Eosinophils Relative: 3
Lymphs Abs: 2.1
Monocytes Relative: 7
Neutrophils Relative %: 85 — ABNORMAL HIGH

## 2011-05-29 LAB — URINE MICROSCOPIC-ADD ON

## 2011-05-29 LAB — URINALYSIS, ROUTINE W REFLEX MICROSCOPIC
Bilirubin Urine: NEGATIVE
Nitrite: NEGATIVE
Specific Gravity, Urine: >1.03 — ABNORMAL HIGH
pH: 6

## 2011-05-29 LAB — CA 125: CA 125: 111.8 — ABNORMAL HIGH

## 2011-05-29 LAB — PREGNANCY, URINE: Preg Test, Ur: NEGATIVE

## 2011-06-12 ENCOUNTER — Encounter (INDEPENDENT_AMBULATORY_CARE_PROVIDER_SITE_OTHER): Payer: PRIVATE HEALTH INSURANCE | Admitting: Psychiatry

## 2011-06-12 DIAGNOSIS — F3189 Other bipolar disorder: Secondary | ICD-10-CM

## 2011-07-28 ENCOUNTER — Other Ambulatory Visit (HOSPITAL_COMMUNITY): Payer: Self-pay | Admitting: Psychiatry

## 2011-08-07 ENCOUNTER — Encounter (HOSPITAL_COMMUNITY): Payer: PRIVATE HEALTH INSURANCE | Admitting: Psychiatry

## 2011-08-07 ENCOUNTER — Ambulatory Visit (INDEPENDENT_AMBULATORY_CARE_PROVIDER_SITE_OTHER): Payer: PRIVATE HEALTH INSURANCE | Admitting: Psychiatry

## 2011-08-07 ENCOUNTER — Encounter (HOSPITAL_COMMUNITY): Payer: Self-pay | Admitting: Psychiatry

## 2011-08-07 VITALS — Wt 204.0 lb

## 2011-08-07 DIAGNOSIS — F319 Bipolar disorder, unspecified: Secondary | ICD-10-CM

## 2011-08-07 MED ORDER — BENZTROPINE MESYLATE 0.5 MG PO TABS: 0.5000 mg | ORAL_TABLET | Freq: Every day | ORAL | Status: DC

## 2011-08-07 MED ORDER — FLUOXETINE HCL 20 MG PO CAPS: 20.0000 mg | ORAL_CAPSULE | Freq: Every day | ORAL | Status: DC

## 2011-08-07 MED ORDER — OLANZAPINE 2.5 MG PO TABS: 2.5000 mg | ORAL_TABLET | Freq: Every day | ORAL | Status: DC

## 2011-08-07 NOTE — Progress Notes (Signed)
Patient came for her followup appointment. She had a good Thanksgiving and she is looking forward to have a good Christmas. She continued to maintain her weight. She likes Cogentin Zyprexa at bedtime. She is sleeping better. She denies any crying spells agitation anger or mood swings. There are no negative thoughts since she start taking the Zyprexa. There no tremors shakes or extrapyramidal side effects. Her primary care physician is Dr. Sherryll Burger and she has seen him recently.   Mental status examination. Pt is well groomed well dressed. She maintained good eye contact. Her speech is soft clear and coherent. She described her mood is anxious and her affect is constricted but there were no paranoia or delusions noted. She denies any active or passive suicidal thoughts or homicidal thoughts. She denies any auditory or visual hallucination. Her thought process is fast but logical and goal-directed. She's alert and oriented x3. Her attention and concentration is fair. Her insight judgment and impulse control is okay.  Assessment Bipolar disorder NOS, rule out Maj. depressive disorder with psychotic features  Plan I will continue her current medication which are Zyprexa 2.5 mg at bedtime Cogentin 0.5 mg bedtime and Prozac 20 mg daily. I will continue to monitor her weight regularly. Patient will continue see her primary care physician for routine blood check. I recommended to call us if she has any question or concern about the medication. I will see her again in 2 months

## 2011-10-11 ENCOUNTER — Ambulatory Visit (INDEPENDENT_AMBULATORY_CARE_PROVIDER_SITE_OTHER): Payer: PRIVATE HEALTH INSURANCE | Admitting: Psychiatry

## 2011-10-11 ENCOUNTER — Encounter (HOSPITAL_COMMUNITY): Payer: Self-pay | Admitting: Psychiatry

## 2011-10-11 VITALS — BP 110/70 | HR 76 | Wt 212.0 lb

## 2011-10-11 DIAGNOSIS — F319 Bipolar disorder, unspecified: Secondary | ICD-10-CM

## 2011-10-11 DIAGNOSIS — Z79899 Other long term (current) drug therapy: Secondary | ICD-10-CM

## 2011-10-11 MED ORDER — OLANZAPINE 2.5 MG PO TABS: 2.5000 mg | ORAL_TABLET | Freq: Every day | ORAL | Status: DC

## 2011-10-11 MED ORDER — FLUOXETINE HCL 20 MG PO CAPS: 20.0000 mg | ORAL_CAPSULE | Freq: Every day | ORAL | Status: DC

## 2011-10-11 MED ORDER — BENZTROPINE MESYLATE 0.5 MG PO TABS: 0.5000 mg | ORAL_TABLET | Freq: Every day | ORAL | Status: DC

## 2011-10-11 NOTE — Progress Notes (Signed)
Chief complaint I'm doing better on the medication  History of present illness Patient is a 56 year old Caucasian married unemployed female who came for her followup appointment. She was last seen almost 3 months ago. She has been compliant with the medication and reported no side effects. She has gained weight however she recently started a volunteer work at [employer] and hoping her volunteer work help her reduce weight. She's been trying to cut down her sweets. She is sleeping 6-7 hours without any problem. She denies any agitation anger or mood swings. Her crying and racing thoughts are less intense and less frequent.  Past psychiatric history Patient has significant history of psychiatric illness. She has at least 6 psychiatric admission with 2 suicidal attempt. She had history of noncompliant with medication resulting in decompensation. She has history of mania and severe depression. In the past she had tried Seroquel, Cymbalta, lithium, Elavil, Depakote and Haldol. She has been stable on a small dose of olanzapine Prozac and Cogentin.  Family history Patient has multiple family member who has psychiatric illness. Her brother has been a patient in this office however now [facility].  Medical history Patient see Dr. Sherryll Burger in [place]. Patient is obese and history of GERD and UTI. She had a shoulder surgery.  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 She denies any illegal drug use.  Psychosocial history Patient lives with her husband. She is currently not working. She has no children. She has good support system through her family. She recently started volunteer work at [employer].  Mental status examination Patient is well-groomed and well-dressed. She is cooperative and pleasant and maintained good eye contact. Her speech is soft clear at times fast but coherent. She described her  mood is good and her affect is pleasant. She denies any active or passive suicidal thinking and homicidal thinking. She denies any auditory or visual hallucination. She denies any paranoid delusions or thinking. Her attention and concentration is fair. Her thought process is logical linear and goal-directed. She's alert and oriented x3. Her insight judgment and impulse control is okay.  Assessment Axis I Bipolar disorder NOS Axis II deferred Axis III see medical history Axis IV mild to moderate Axis V 60-65  Plan I will continue her Zyprexa Prozac and Cogentin. At this time patient reported no side effects of medication. I will order CBC CMP and hemoglobin A1c since she has no blood work in past 8 months. I have explained risks and benefits of medication. I also encouraged her to watch her diet and do regular exercise to have a better control on her weight. I will see her in 2 months. Time spent 30 minutes

## 2011-10-12 LAB — COMPREHENSIVE METABOLIC PANEL
Albumin: 4.1 g/dL (ref 3.5–5.2)
CO2: 24 mEq/L (ref 19–32)
Calcium: 9.7 mg/dL (ref 8.4–10.5)
Chloride: 105 mEq/L (ref 96–112)
Glucose, Bld: 90 mg/dL (ref 70–99)
Sodium: 140 mEq/L (ref 135–145)
Total Bilirubin: 0.5 mg/dL (ref 0.3–1.2)
Total Protein: 7 g/dL (ref 6.0–8.3)

## 2011-10-12 LAB — CBC WITH DIFFERENTIAL/PLATELET
Basophils Absolute: 0 10*3/uL (ref 0.0–0.1)
Eosinophils Relative: 3 % (ref 0–5)
HCT: 43 % (ref 36.0–46.0)
Lymphocytes Relative: 42 % (ref 12–46)
MCHC: 32.8 g/dL (ref 30.0–36.0)
MCV: 88.5 fL (ref 78.0–100.0)
Monocytes Absolute: 0.6 10*3/uL (ref 0.1–1.0)
Monocytes Relative: 7 % (ref 3–12)
RDW: 13.7 % (ref 11.5–15.5)
WBC: 7.4 10*3/uL (ref 4.0–10.5)

## 2011-12-11 ENCOUNTER — Ambulatory Visit (INDEPENDENT_AMBULATORY_CARE_PROVIDER_SITE_OTHER): Payer: PRIVATE HEALTH INSURANCE | Admitting: Psychiatry

## 2011-12-11 ENCOUNTER — Encounter (HOSPITAL_COMMUNITY): Payer: Self-pay | Admitting: Psychiatry

## 2011-12-11 VITALS — Wt 217.0 lb

## 2011-12-11 DIAGNOSIS — F319 Bipolar disorder, unspecified: Secondary | ICD-10-CM

## 2011-12-11 MED ORDER — OLANZAPINE 2.5 MG PO TABS: 2.5000 mg | ORAL_TABLET | Freq: Every day | ORAL | Status: DC

## 2011-12-11 MED ORDER — FLUOXETINE HCL 20 MG PO CAPS: 20.0000 mg | ORAL_CAPSULE | Freq: Every day | ORAL | Status: DC

## 2011-12-11 MED ORDER — BENZTROPINE MESYLATE 0.5 MG PO TABS: 0.5000 mg | ORAL_TABLET | Freq: Every day | ORAL | Status: DC

## 2011-12-11 NOTE — Progress Notes (Signed)
Chief complaint I am feeling tired and I have no energy.    History of present illness Patient is a 56 year old Caucasian married unemployed female who came for her followup appointment.  She has been compliant with medication but recently she reported tired feeling and she has no energy to do things.  I reviewed her blood work which shows normal CBC and chemistry except hemoglobin A1c 5.7 and mild elevation of alkaline phosphatase.  Though patient reported that her depression mood and mania are much control .  She also sleeps better but sometimes she feels that she has no energy .  She also has gained weight from her last visit .  She has limited socialization and sometimes stays to herself .  She denies any crying spells or any active or passive suicidal thinking.  She told recently one of her family member has been sick due to significant medical is and Dr. has given limited time .  She is concerned about that can remember however she does not believe this is the reason that she's been tired .she's not walking or doing regular exercise.  She's not monitoring her appetite .  She denies any tremors or any side effects.  She denies an agitation anger or mood swings.  Current psychiatric medication  Zyprexa 2.5 mg at bedtime  Prozac 20 mg daily Cogentin 0.5 mg at bedtime   Past psychiatric history Patient has significant history of psychiatric illness. She has at least 6 psychiatric admission with 2 suicidal attempt. She had history of noncompliant with medication resulting in decompensation. She has history of mania and severe depression. In the past she had tried Seroquel, Cymbalta, lithium, Elavil, Depakote and Haldol. She has been stable on a small dose of olanzapine Prozac and Cogentin.  Family history Patient has multiple family member who has psychiatric illness. Her brother has been a patient in this office however now he sees [facility].  She also has a family history of  diabetes.  Medical history Patient see Dr. Sherryll Burger in [place]. Patient is obese and history of GERD and UTI. She had a shoulder surgery.  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 She denies any illegal drug use.  Psychosocial history Patient lives with her husband. She is currently not working. She has no children. She has good support system through her family. She recently started volunteer work at [employer].  Mental status examination Patient is well-groomed and well-dressed. She is cooperative and pleasant and maintained good eye contact. Her speech is soft clear at times fast but coherent. She described her mood is tired and exhausted and her affect is mood congruent.  She denies any active or passive suicidal thinking and homicidal thinking. She denies any auditory or visual hallucination. She denies any paranoid delusions or thinking. Her attention and concentration is fair. Her thought process is logical linear and goal-directed. She's alert and oriented x3. Her insight judgment and impulse control is okay.  Assessment Axis I Bipolar disorder NOS Axis II deferred Axis III see medical history Axis IV mild to moderate Axis V 60-65  Plan I reviewed psychosocial stressors, blood results, medication response, collateral information and last progress note.  I do believe patient should see a family doctor for further workup.  I recommend to see his doctor for thyroid studies and further workup for diabetes.  Patient has been a history of diabetes .  Patient will schedule appointment with her primary care physician.  I also recommend to do regular exercise and monitor her calorie intake .  Patient does not want to change her Zyprexa .  She is taking very low dose Zyprexa for a long time and that has been working well .  I provided a copy of blood results to the patient but she will take it with her when she will  see her primary care physician.  I recommend to call us if she has any question or concern about the medication.  I explained risks and benefits of medication in detail.  I also recommend to call us if she feels worsening of her symptoms.  I will see her again in 2 months.  Time spent 30 minutes.

## 2012-02-14 ENCOUNTER — Ambulatory Visit (INDEPENDENT_AMBULATORY_CARE_PROVIDER_SITE_OTHER): Payer: PRIVATE HEALTH INSURANCE | Admitting: Psychiatry

## 2012-02-14 ENCOUNTER — Encounter (HOSPITAL_COMMUNITY): Payer: Self-pay | Admitting: Psychiatry

## 2012-02-14 VITALS — BP 122/82 | HR 85 | Wt 215.0 lb

## 2012-02-14 DIAGNOSIS — F319 Bipolar disorder, unspecified: Secondary | ICD-10-CM

## 2012-02-14 MED ORDER — FLUOXETINE HCL 20 MG PO CAPS: 20.0000 mg | ORAL_CAPSULE | Freq: Every day | ORAL | Status: DC

## 2012-02-14 MED ORDER — OLANZAPINE 2.5 MG PO TABS: 2.5000 mg | ORAL_TABLET | Freq: Every day | ORAL | Status: DC

## 2012-02-14 MED ORDER — BENZTROPINE MESYLATE 0.5 MG PO TABS: 0.5000 mg | ORAL_TABLET | Freq: Every day | ORAL | Status: DC

## 2012-02-14 NOTE — Progress Notes (Signed)
Chief complaint Medication management and followup.     History of present illness Patient is a 56 year old Caucasian married unemployed female who came for her followup appointment.  She recently seen her primary care physician Dr. Sherryll Burger and discuss the results with him .  He has not started any new medication however he recommended to check his blood sugar regularly.  Patient has been checking her blood sugar every day and so far her readings are normal.  She's compliant with her psychiatric medication.  She likes Zyprexa at bedtime which is helping her sleep.  She denies any agitation anger mood swing.  She denies any manic-like symptoms .  Her thoughts are organized.  Her attention and concentration is better.  She feel less tired and more energetic.  She is happy as she drop 2 pounds from her last visit.  She's been trying to lose weight by watching her calorie intake .  She cannot do much exercise due to arthritis.  Recently she has given intra-articular injection due to flareup of her arthritis.  She is concerned about her baby sister who has significant COPD and has been hospitalized multiple times due to exacerbation of COPD.  Overall patient is doing very well on her medication.  She denies any tremors or shakes.  She denies any crying spells.  She's not drinking or using any illegal substance.  [place] Blood pressure is stable , weight 215 pounds.    Current psychiatric medication  Zyprexa 2.5 mg at bedtime  Prozac 20 mg daily Cogentin 0.5 mg at bedtime   Past psychiatric history Patient has significant history of psychiatric illness. She has at least 6 psychiatric admission with 2 suicidal attempt. She had history of noncompliant with medication resulting in decompensation. She has history of mania and severe depression. In the past she had tried Seroquel, Cymbalta, lithium, Elavil, Depakote and Haldol. She has been stable on a small dose of olanzapine Prozac and Cogentin.  Family  history Patient has multiple family member who has psychiatric illness. Her brother has been a patient in this office however now he sees [facility].  She also has a family history of diabetes.  Medical history Patient see Dr. Sherryll Burger in [place]. Patient is obese and history of GERD and UTI. She had a shoulder surgery.  Recently she has given intra-articular injection for her knee pain.  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 She denies any illegal drug use.  Psychosocial history Patient lives with her husband. She is currently not working. She has no children. She has good support system through her family. She is working as Agricultural consultant at [employer].  Mental status examination Patient is well-groomed and well-dressed. She is cooperative and pleasant and maintained good eye contact. Her speech is soft and clear.  Her thought process is logical and goal-directed.  She described her mood is neutral and her affect is mood congruent.  She denies any active or passive suicidal thinking and homicidal thinking. She denies any auditory or visual hallucination. She denies any paranoid delusions or thinking. Her attention and concentration is fair. Her thought process is logical linear and goal-directed. She's alert and oriented x3. Her insight judgment and impulse control is okay.  Assessment Axis I Bipolar disorder NOS Axis II deferred Axis III see medical history Axis IV mild to moderate Axis V 60-65  Plan I I do believe patient is  doing very well on her current psychiatric medication.  She has seen recently her primary care physician who has been monitoring her blood sugar .  I recommend to continue monitor her calorie intake and exercise as possible.  I will continue her current psychiatric medication and recommended to call us if she has any question or concern about the medication or if she feels  worsening of the symptoms.  Time spent 30 minutes.  I will see her again in 2 months.  Portion of this note is generated with voice dictation software and may contain typographical error.

## 2012-04-08 ENCOUNTER — Encounter (HOSPITAL_COMMUNITY): Payer: Self-pay | Admitting: Psychiatry

## 2012-04-08 ENCOUNTER — Ambulatory Visit (INDEPENDENT_AMBULATORY_CARE_PROVIDER_SITE_OTHER): Payer: PRIVATE HEALTH INSURANCE | Admitting: Psychiatry

## 2012-04-08 VITALS — BP 139/83 | HR 84 | Wt 213.0 lb

## 2012-04-08 DIAGNOSIS — F319 Bipolar disorder, unspecified: Secondary | ICD-10-CM

## 2012-04-08 MED ORDER — BENZTROPINE MESYLATE 0.5 MG PO TABS: 0.5000 mg | ORAL_TABLET | Freq: Every day | ORAL | Status: DC

## 2012-04-08 MED ORDER — FLUOXETINE HCL 20 MG PO CAPS: 20.0000 mg | ORAL_CAPSULE | Freq: Every day | ORAL | Status: DC

## 2012-04-08 MED ORDER — OLANZAPINE 2.5 MG PO TABS: 2.5000 mg | ORAL_TABLET | Freq: Every day | ORAL | Status: DC

## 2012-04-08 NOTE — Progress Notes (Signed)
Chief complaint Medication management and followup.     History of present illness Patient is a 56 year old Caucasian married unemployed female who came for her followup appointment.  [facility]  She's recovering from surgery.  She's taking Vicodin which is prescribed by her primary care physician.  She is scheduled to have followup appointment with her doctor on August 27.  Her primary care physician is Dr. Sherryll Burger.  She denies any recent panic attack, agitation or anger.  She sleeping better.  She likes her current psychiatric medication.  She's been checking her blood sugar regularly.  She's excited about family reunion which will happen in few weeks.  Overall she is stable on her current psychiatric medication.  She has any tremors shakes or any side effects.  She's not drinking or using any illegal substance.  Current psychiatric medication  Zyprexa 2.5 mg at bedtime  Prozac 20 mg daily Cogentin 0.5 mg at bedtime  Vicodin prescribed by primary care physician.  Past psychiatric history Patient has significant history of psychiatric illness. She has at least 6 psychiatric admission with 2 suicidal attempt. She had history of noncompliant with medication resulting in decompensation. She has history of mania and severe depression. In the past she had tried Seroquel, Cymbalta, lithium, Elavil, Depakote and Haldol. She has been stable on a small dose of olanzapine Prozac and Cogentin.  Family history Patient has multiple family member who has psychiatric illness. Her brother has been a patient in this office however now he sees [facility].  She also has a family history of diabetes.  Medical history Patient see Dr. Sherryll Burger in [place]. Patient is obese and history of GERD and UTI. She had a shoulder surgery.  Recently she has hernia repair .  She has arthritis.  Alcohol and substance use history Patient has history of alcohol use in the past however  claims to be sober in past 10 years. She denies any illegal drug use.  Psychosocial history Patient lives with her husband. She is currently not working. She has no children. She has good support system through her family. She is working as Agricultural consultant at [employer].  Mental status examination Patient is well-groomed and well-dressed. She is cooperative and pleasant and maintained good eye contact. Her speech is soft and clear.  Her thought process is logical and goal-directed.  She described her mood is good and her affect is mood congruent.  She denies any active or passive suicidal thinking and homicidal thinking. She denies any auditory or visual hallucination. She denies any paranoid delusions or thinking. Her attention and concentration is fair. Her thought process is logical linear and goal-directed. She's alert and oriented x3. Her insight judgment and impulse control is okay.  Assessment Axis I Bipolar disorder NOS Axis II deferred Axis III see medical history Axis IV mild to moderate Axis V 60-65  Plan I will continue her current psychiatric medication.  I explained the interaction of pain medication with psychiatric medication.  I explained the risks and benefits of medication.  At this time patient does not have any side effects.  I recommend to continue watch her calorie intake and do regular exercise.  Time spent 30 minutes.  I will see her again in 2 months.  I recommend to call us if she is any question or concern or if she feels worsening of the symptoms.  Portion of this note is generated with voice dictation software and may contain  typographical error.

## 2012-06-10 ENCOUNTER — Ambulatory Visit (HOSPITAL_COMMUNITY): Payer: Self-pay | Admitting: Psychiatry

## 2012-06-17 ENCOUNTER — Ambulatory Visit (INDEPENDENT_AMBULATORY_CARE_PROVIDER_SITE_OTHER): Payer: PRIVATE HEALTH INSURANCE | Admitting: Psychiatry

## 2012-06-17 ENCOUNTER — Encounter (HOSPITAL_COMMUNITY): Payer: Self-pay | Admitting: Psychiatry

## 2012-06-17 VITALS — Wt 214.0 lb

## 2012-06-17 DIAGNOSIS — F319 Bipolar disorder, unspecified: Secondary | ICD-10-CM

## 2012-06-17 MED ORDER — OLANZAPINE 2.5 MG PO TABS: 2.5000 mg | ORAL_TABLET | Freq: Every day | ORAL | Status: DC

## 2012-06-17 MED ORDER — FLUOXETINE HCL 20 MG PO CAPS: 20.0000 mg | ORAL_CAPSULE | Freq: Every day | ORAL | Status: DC

## 2012-06-17 MED ORDER — BENZTROPINE MESYLATE 0.5 MG PO TABS: 0.5000 mg | ORAL_TABLET | Freq: Every day | ORAL | Status: DC

## 2012-06-17 NOTE — Progress Notes (Signed)
Chief complaint Medication management and followup.     History of present illness Patient came for her followup ointment.  She was recently seen by her primary care physician for annual checkup and blood work.  She may have a UTI however she has not received any results.  She was told that her cholesterol is also borderline high but she was not recommended any medication.  She stopped taking pain medication.  She is fully recovered from her hernia surgery.  She is fairly stable on her psychiatric medication.  She sleeping at least 7 hours.  She denies any irritability agitation anger mood swing.  She denies any crying spells.  Her appetite is good.  She is concerned about her weight gain however she does not want to change her psychiatric medication.  She denies any tremors or shakes.  She's not drinking or using any illegal substance.  Current psychiatric medication  Zyprexa 2.5 mg at bedtime  Prozac 20 mg daily Cogentin 0.5 mg at bedtime   Past psychiatric history Patient has significant history of psychiatric illness. She has at least 6 psychiatric admission with 2 suicidal attempt. She had history of noncompliant with medication resulting in decompensation. She has history of mania and severe depression. In the past she had tried Seroquel, Cymbalta, lithium, Elavil, Depakote and Haldol. She has been stable on a small dose of olanzapine Prozac and Cogentin.  Family history Patient has multiple family member who has psychiatric illness. Her brother has been a patient in this office however now he sees [facility].  She also has a family history of diabetes.  Medical history Patient see Dr. Sherryll Burger in [place]. Patient is obese and history of GERD, UTI, arthritis. She had a shoulder surgery and recently hernia repair.  She has blood work and requested results to be faxed to [place].  Alcohol and substance use history Patient has history of alcohol use in the past however claims to be sober  for past 10 years. She denies any illegal drug use.  Psychosocial history Patient lives with her husband. She is currently not working. She has no children. She has good support system through her family. She is working as Agricultural consultant at [employer].  Mental status examination Patient is well-groomed and well-dressed. She is cooperative and pleasant and maintained good eye contact. Her speech is soft and clear.  Her thought process is logical and goal-directed.  She described her mood is good and her affect is mood congruent.  She denies any active or passive suicidal thinking and homicidal thinking. She denies any auditory or visual hallucination. She denies any paranoid delusions or thinking. Her attention and concentration is fair. Her thought process is logical linear and goal-directed. She's alert and oriented x3. Her insight judgment and impulse control is okay.  Assessment Axis I Bipolar disorder NOS Axis II deferred Axis III see medical history Axis IV mild to moderate Axis V 60-65  Plan I will continue her current psychiatric medication.  I also explained that she will see a new psychiatrist on her next appointments and signed when confronted [place].  Patient express some anxiety about the change.  She does not want to change her medication. Reassurance given.  We will get collateral information from her primary care physician including recent blood work.  I explained risks and benefits of medication and recommend to call us if she is any question or concern or side effects.  Followup in 2 months per Dr. Dan Humphreys.  Portion of  this note is generated with voice dictation software and may contain typographical error.

## 2012-08-07 DIAGNOSIS — Z87440 Personal history of urinary (tract) infections: Secondary | ICD-10-CM | POA: Insufficient documentation

## 2012-08-07 DIAGNOSIS — N289 Disorder of kidney and ureter, unspecified: Secondary | ICD-10-CM | POA: Insufficient documentation

## 2012-08-13 ENCOUNTER — Ambulatory Visit (INDEPENDENT_AMBULATORY_CARE_PROVIDER_SITE_OTHER): Payer: PRIVATE HEALTH INSURANCE | Admitting: Psychiatry

## 2012-08-13 ENCOUNTER — Encounter (HOSPITAL_COMMUNITY): Payer: Self-pay | Admitting: Psychiatry

## 2012-08-13 VITALS — Wt 215.0 lb

## 2012-08-13 DIAGNOSIS — M1990 Unspecified osteoarthritis, unspecified site: Secondary | ICD-10-CM | POA: Insufficient documentation

## 2012-08-13 DIAGNOSIS — F319 Bipolar disorder, unspecified: Secondary | ICD-10-CM

## 2012-08-13 DIAGNOSIS — E669 Obesity, unspecified: Secondary | ICD-10-CM

## 2012-08-13 MED ORDER — OLANZAPINE 2.5 MG PO TABS: 2.5000 mg | ORAL_TABLET | Freq: Every day | ORAL | Status: DC

## 2012-08-13 MED ORDER — TOPIRAMATE 25 MG PO TABS: 25.0000 mg | ORAL_TABLET | Freq: Two times a day (BID) | ORAL | Status: DC

## 2012-08-13 MED ORDER — FLUOXETINE HCL 20 MG PO CAPS: 20.0000 mg | ORAL_CAPSULE | Freq: Every day | ORAL | Status: DC

## 2012-08-13 MED ORDER — BENZTROPINE MESYLATE 0.5 MG PO TABS: 0.5000 mg | ORAL_TABLET | Freq: Every day | ORAL | Status: DC

## 2012-08-13 NOTE — Progress Notes (Signed)
Chief complaint Chief Complaint  Patient presents with  . Depression  . Manic Behavior  . Follow-up  . Medication Refill   Subjective: "I'm doing pretty good.  I don't like the 50 extra pounds that Zyprea has given me.  I did like the weight loss I got on Topamax".  History of present illness Patient came for her followup appointment.  Pt reports that she is compliant with the psychotropic medications with great benefit and considerable side effects.  The Zyprexa really helps the mood and getting to sleep.  The weight gain is horrible.  Pt did not have clouding of the consciousness on the Topamax.  Will retry that.  Current psychiatric medication  Zyprexa 2.5 mg at bedtime  Prozac 20 mg daily Cogentin 0.5 mg at bedtime   Past psychiatric history Patient has significant history of psychiatric illness. She has at least 6 psychiatric admission with 2 suicidal attempt. She had history of noncompliant with medication resulting in decompensation. She has history of mania and severe depression. In the past she had tried Seroquel, Cymbalta, lithium, Elavil, Depakote and Haldol. She has been stable on a small dose of olanzapine Prozac and Cogentin.  Family history Patient has multiple family member who has psychiatric illness. Her brother has been a patient in this office however now he sees [facility].  She also has a family history of diabetes.  Medical history Patient see Dr. Sherryll Burger in [place]. Patient is obese with a history of GERD, UTI, and arthritis. She had shoulder surgery and recently hernia repair.  She is expected to have her left kidney removed next week.  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 She denies any illegal drug use.  Psychosocial history Patient lives with her husband. She is currently not working. She has no children. She has good support system through her family. She is  working as Agricultural consultant at [employer].  Mental status examination Patient is well-groomed and well-dressed. She is cooperative and pleasant and maintained good eye contact. Her speech is soft and clear.  Her thought process is logical and goal-directed.  She described her mood is good and her affect is mood congruent.  She denies any active or passive suicidal thinking and homicidal thinking. She denies any auditory or visual hallucination. She denies any paranoid delusions or thinking. Her attention and concentration is fair. Her thought process is logical linear and goal-directed. She's alert and oriented x3. Her insight judgment and impulse control is okay.  Assessment Axis I Bipolar disorder NOS Axis II deferred Axis III see medical history Axis IV mild to moderate Axis V 60-65  Plan I took her vitals.  I reviewed CC, tobacco/med/surg Hx, meds effects/ side effects, problem list, therapies and responses as well as current situation/symptoms discussed options. See orders and pt instructions for more details.

## 2012-08-13 NOTE — Patient Instructions (Addendum)
Could use "Move Free" or "Osteo bi Flex" for arthritic pain.   The important ingredients are Chondrotin Sulfate and Glucosamine.  Tumeric is also helpful for arthritis.   Krill oil and cod liver oil may be helpful for arthritis.   Topamax may help reduce the appetite.  CUT BACK/CUT OUT on sugar and carbohydrates, that means very limited fruits and starchy vegetables and very limited grains, breads  The goal is low GLYCEMIC INDEX.  CUT OUT all wheat, rye, or barley  HIGH fat and LOW carbohydrate diet is the KEY.  Eat avocados, eggs, lean meat like grass fed beef and chicken  Nuts and seeds would be good foods as well.   Stevia is an excellent sweetener.  Tilden Fossa is an excellent resource for weight reduction.  She is a Publishing rights manager who has practiced for 15 years in the field of weight management.  Her phone number is [phone].    "[employer] by Camelia Phenes could be very helpful.  "[place]" compiled by [employer] could be helpful.  Have a happy holiday!

## 2012-08-18 ENCOUNTER — Ambulatory Visit (HOSPITAL_COMMUNITY): Payer: Self-pay | Admitting: Psychiatry

## 2012-08-18 DIAGNOSIS — N289 Disorder of kidney and ureter, unspecified: Secondary | ICD-10-CM | POA: Insufficient documentation

## 2012-08-18 HISTORY — PX: OTHER SURGICAL HISTORY: SHX169

## 2012-09-29 ENCOUNTER — Encounter (HOSPITAL_COMMUNITY): Payer: Self-pay | Admitting: Psychiatry

## 2012-09-29 ENCOUNTER — Ambulatory Visit (INDEPENDENT_AMBULATORY_CARE_PROVIDER_SITE_OTHER): Payer: PRIVATE HEALTH INSURANCE | Admitting: Psychiatry

## 2012-09-29 VITALS — Wt 209.6 lb

## 2012-09-29 DIAGNOSIS — E669 Obesity, unspecified: Secondary | ICD-10-CM

## 2012-09-29 DIAGNOSIS — F319 Bipolar disorder, unspecified: Secondary | ICD-10-CM

## 2012-09-29 DIAGNOSIS — R69 Illness, unspecified: Secondary | ICD-10-CM | POA: Insufficient documentation

## 2012-09-29 MED ORDER — OLANZAPINE 2.5 MG PO TABS: 2.5000 mg | ORAL_TABLET | Freq: Every day | ORAL | Status: DC

## 2012-09-29 MED ORDER — BENZTROPINE MESYLATE 0.5 MG PO TABS: 0.5000 mg | ORAL_TABLET | Freq: Every day | ORAL | Status: DC

## 2012-09-29 MED ORDER — FLUOXETINE HCL 20 MG PO CAPS: 20.0000 mg | ORAL_CAPSULE | Freq: Every day | ORAL | Status: DC

## 2012-09-29 MED ORDER — TOPIRAMATE 25 MG PO TABS: 25.0000 mg | ORAL_TABLET | Freq: Two times a day (BID) | ORAL | Status: DC

## 2012-09-29 NOTE — Progress Notes (Addendum)
[facility] [address] Progress Note Amanda Lara MRN: [id number] DOB: July 18, 1956 Age: 57 y.o.  Date: 09/29/2012 Start Time: 10:53 AM End Time: 11:18 AM  Chief Complaint: Chief Complaint  Patient presents with  . Depression  . Follow-up  . Medication Refill   Subjective: "I've lost some weight and I lost a kidney".  History of present illness Patient came for her followup appointment.  Pt reports that she is compliant with the psychotropic medications with good benefit and no noticeable side effects.  She notes short term memory loss after several anesthesia experiences.   Explained that she could use twice a day fish oil, and possibly the Brain Power Basics from [facility], as well as shift to sweetening everything with Stevia instead of sugar.  She is drinking up to 64 oz of water a day.  She notes no confusion with the Topamax.  Current psychiatric medication  Zyprexa 2.5 mg at bedtime  Prozac 20 mg daily Cogentin 0.5 mg at bedtime  Topamax 25 mg twice a day  Past psychiatric history Patient has significant history of psychiatric illness. She has at least 6 psychiatric admission with 2 suicidal attempt. She had history of noncompliant with medication resulting in decompensation. She has history of mania and severe depression. In the past she had tried Seroquel, Cymbalta, lithium, Elavil, Depakote and Haldol. She has been stable on a small dose of olanzapine Prozac and Cogentin.  Family history Patient has multiple family member who has psychiatric illness. Her brother has been a patient in this office however now [facility].  She also has a family history of diabetes. family history includes Alcohol abuse in her brother and father; Anxiety disorder in her brother; Bipolar disorder in her maternal aunt, maternal grandmother, and mother; Dementia in her father; Depression in her brother, father, and sister; Drug abuse in her brother; and Seizures in her  sister.  There is no history of ADD / ADHD, and OCD, and Paranoid behavior, and Schizophrenia, and Sexual abuse, and Physical abuse, .  Medical history Patient see Dr. Sherryll Burger in [place]. Patient is obese with a history of GERD, UTI, and arthritis. She had shoulder surgery and recently hernia repair.  She is expected to have her left kidney removed next week.  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 She denies any illegal drug use.  Psychosocial history Patient lives with her husband. She is currently not working. She has no children. She has good support system through her family. She is working as Agricultural consultant at [employer].  Mental status examination Patient is well-groomed and well-dressed. She is cooperative and pleasant and maintained good eye contact. Her speech is soft and clear.  Her thought process is logical and goal-directed.  She described her mood is good and her affect is mood congruent.  She denies any active or passive suicidal thinking and homicidal thinking. She denies any auditory or visual hallucination. She denies any paranoid delusions or thinking. Her attention and concentration is fair. Her thought process is logical linear and goal-directed. She's alert and oriented x3. Her insight judgment and impulse control is okay.  Lab Results:  Recent Results (from the past 8736 hour(s))  CBC WITH DIFFERENTIAL   Collection Time   10/11/11 10:23 AM      Component Value Range   WBC 7.4  4.0 - 10.5 K/uL   RBC 4.86  3.87 - 5.11 MIL/uL   Hemoglobin 14.1  12.0 - 15.0 g/dL   HCT 21.3  08.6 - 57.8 %   MCV 88.5  78.0 - 100.0 fL   MCH 29.0  26.0 - 34.0 pg   MCHC 32.8  30.0 - 36.0 g/dL   RDW 46.9  62.9 - 52.8 %   Platelets 270  150 - 400 K/uL   Neutrophils Relative 48  43 - 77 %   Neutro Abs 3.5  1.7 - 7.7 K/uL   Lymphocytes Relative 42  12 - 46 %   Lymphs Abs 3.1  0.7 - 4.0 K/uL   Monocytes Relative  7  3 - 12 %   Monocytes Absolute 0.6  0.1 - 1.0 K/uL   Eosinophils Relative 3  0 - 5 %   Eosinophils Absolute 0.2  0.0 - 0.7 K/uL   Basophils Relative 0  0 - 1 %   Basophils Absolute 0.0  0.0 - 0.1 K/uL   Smear Review Criteria for review not met    HEMOGLOBIN A1C   Collection Time   10/11/11 10:23 AM      Component Value Range   Hemoglobin A1C 5.7 (*) <5.7 %   Mean Plasma Glucose 117 (*) <117 mg/dL  COMPREHENSIVE METABOLIC PANEL   Collection Time   10/11/11 10:23 AM      Component Value Range   Sodium 140  135 - 145 mEq/L   Potassium 4.3  3.5 - 5.3 mEq/L   Chloride 105  96 - 112 mEq/L   CO2 24  19 - 32 mEq/L   Glucose, Bld 90  70 - 99 mg/dL   BUN 14  6 - 23 mg/dL   Creat 4.13  2.44 - 0.10 mg/dL   Total Bilirubin 0.5  0.3 - 1.2 mg/dL   Alkaline Phosphatase 127 (*) 39 - 117 U/L   AST 21  0 - 37 U/L   ALT 17  0 - 35 U/L   Total Protein 7.0  6.0 - 8.3 g/dL   Albumin 4.1  3.5 - 5.2 g/dL   Calcium 9.7  8.4 - 27.2 mg/dL   Had results from [facility] in October, of a pretty normal CBC.  Will have pt see if lipid profile was drawn at that time and get those results.   Assessment Axis I Bipolar disorder NOS Axis II deferred Axis III see medical history Axis IV mild to moderate Axis V 60-65  Plan: I took her vitals.  I reviewed CC, tobacco/med/surg Hx, meds effects/ side effects, problem list, therapies and responses as well as current situation/symptoms discussed options. See orders and pt instructions for more details.  Medical Decision Making Problem Points:  Established problem, stable/improving (1), Review of last therapy session (1) and Review of psycho-social stressors (1) Data Points:  Review or order clinical lab tests (1) Review of medication regiment & side effects (2)  I certify that outpatient services furnished can reasonably be expected to improve the patient's condition.   Orson Aloe, MD, [facility]  Addendum:  11/04/2012 Labs show elevated LDL cholseterol.  Left  message on answering machine indicating that this was Dr Lacretia Nicks and that someone had labs and they showed something to be discussed.  Invited them to call back.  Orson Aloe, MD, [facility] Addendum:  11/05/2012 Voice message did not identify the owner of the phone, but I identified myself as "Dr Lacretia Nicks" and suggested that they have their family doctor get a copy of the labs  so that they could offer some suggestions for them. Orson Aloe, MD, [facility]

## 2012-11-03 LAB — LIPID PANEL
Cholesterol: 246 mg/dL — ABNORMAL HIGH (ref 0–200)
Triglycerides: 112 mg/dL (ref <150)

## 2012-11-03 LAB — BASIC METABOLIC PANEL: Calcium: 9.3 mg/dL (ref 8.4–10.5)

## 2012-11-27 ENCOUNTER — Encounter (HOSPITAL_COMMUNITY): Payer: Self-pay | Admitting: Psychiatry

## 2012-11-27 ENCOUNTER — Ambulatory Visit (INDEPENDENT_AMBULATORY_CARE_PROVIDER_SITE_OTHER): Payer: PRIVATE HEALTH INSURANCE | Admitting: Psychiatry

## 2012-11-27 VITALS — Wt 211.2 lb

## 2012-11-27 DIAGNOSIS — E785 Hyperlipidemia, unspecified: Secondary | ICD-10-CM

## 2012-11-27 DIAGNOSIS — E669 Obesity, unspecified: Secondary | ICD-10-CM

## 2012-11-27 DIAGNOSIS — M1990 Unspecified osteoarthritis, unspecified site: Secondary | ICD-10-CM

## 2012-11-27 DIAGNOSIS — F319 Bipolar disorder, unspecified: Secondary | ICD-10-CM

## 2012-11-27 MED ORDER — BENZTROPINE MESYLATE 0.5 MG PO TABS: 0.5000 mg | ORAL_TABLET | Freq: Every day | ORAL | Status: DC

## 2012-11-27 MED ORDER — OLANZAPINE 2.5 MG PO TABS: 2.5000 mg | ORAL_TABLET | Freq: Every day | ORAL | Status: DC

## 2012-11-27 MED ORDER — FLUOXETINE HCL 20 MG PO CAPS: 20.0000 mg | ORAL_CAPSULE | Freq: Every day | ORAL | Status: DC

## 2012-11-27 MED ORDER — TOPIRAMATE 25 MG PO TABS: 25.0000 mg | ORAL_TABLET | Freq: Two times a day (BID) | ORAL | Status: DC

## 2012-11-27 NOTE — Patient Instructions (Signed)
Set a timer for 8 minutes and walk for that amount of time in the house or in the yard.  Mark "8" on a calendar for that day.  Do that every day this week.  Then next week increase the time to 9 minutes and then mark the calendar with a 9 for that day.  Each week increase your exercise by one minute.  Keep a record of this so you can see what progress you are making.  Do this every day, just like eating and sleeping.  It is good for pain control, depression, and for your soul/spirit.  Bring the record in for your next visit so we can talk about your effort and how you feel with the new exercise program going and working for you.  CUT BACK/CUT OUT on sugar and carbohydrates, that means very limited fruits and starchy vegetables and very limited grains, breads  The goal is low GLYCEMIC INDEX.  CUT OUT all wheat, rye, or barley for the GLUTEN in them.  HIGH fat and LOW carbohydrate diet is the KEY.  Eat avocados, eggs, lean meat like grass fed beef and chicken  Nuts and seeds would be good foods as well.   Stevia is an excellent sweetener.  Safe for the brain.   Truvia is also a good safe sweetener, not the baking blend form of Truvia  Almond butter is awesome.  Check out all this on the Internet.  Dr Purlmutter is on the Internet with some good info about this.   http://www.drperlmutter.com is where that is.  An excellent site for info on this diet is http://paleoleap.com  [employer] makes dark chocolate that is sweetened with Stevia that is safe.  Take care of yourself.  No one else is standing up to do the job and only you know what you need.   GET SERIOUS about taking care of yourself.  Do the next right thing and that often means doing something to care for yourself along the lines of are you hungry, are you angry, are you lonely, are you tired, are you scared?  HALTS is what that stands for.  Call if problems or concerns.   

## 2012-11-27 NOTE — Progress Notes (Signed)
[facility] [address] Progress Note KIERNAN FARKAS MRN: [id number] DOB: [birth year] Age: 57 y.o.  Date: 11/27/2012 Start Time: 9:08 AM End Time: 9:20 AM  Chief Complaint: Chief Complaint  Patient presents with  . Depression  . Follow-up  . Medication Refill   Subjective: "I've gained some weight". Depression 2/10 and Anxiety 0/10, where 0 is none and 10 is the worst.  Pain is 0/10  History of present illness Patient came for her followup appointment.  Pt reports that she is compliant with the psychotropic medications with good benefit and no noticeable side effects.  She has added fish oil to her regimen with no noticeable effect.   Current psychiatric medication  Zyprexa 2.5 mg at bedtime  Prozac 20 mg daily Cogentin 0.5 mg at bedtime  Topamax 25 mg twice a day Fish Oil 1000 mg twice a day.  Past psychiatric history Patient has significant history of psychiatric illness. She has at least 6 psychiatric admission with 2 suicidal attempt. She had history of noncompliant with medication resulting in decompensation. She has history of mania and severe depression. In the past she had tried Seroquel, Cymbalta, lithium, Elavil, Depakote and Haldol. She has been stable on a small dose of olanzapine Prozac and Cogentin.  Family history Patient has multiple family member who has psychiatric illness. Her brother has been a patient in this office however now he [facility].  She also has a family history of diabetes. family history includes Alcohol abuse in her brother and father; Anxiety disorder in her brother; Bipolar disorder in her maternal aunt, maternal grandmother, and mother; Dementia in her father; Depression in her brother, father, and sister; Drug abuse in her brother; and Seizures in her sister.  There is no history of ADD / ADHD, and OCD, and Paranoid behavior, and Schizophrenia, and Sexual abuse, and Physical abuse, .  Medical history Patient see Dr.  Sherryll Burger in [place]. Patient is obese with a history of GERD, UTI, and arthritis. She had shoulder surgery and recently hernia repair.  She is expected to have her left kidney removed next week.  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 She denies any illegal drug use.  Psychosocial history Patient lives with her husband. She is currently not working. She has no children. She has good support system through her family. She is working as Agricultural consultant at [employer].  Mental status examination Patient is well-groomed and well-dressed. She is cooperative and pleasant and maintained good eye contact. Her speech is soft and clear.  Her thought process is logical and goal-directed.  She described her mood is good and her affect is mood congruent.  She denies any active or passive suicidal thinking and homicidal thinking. She denies any auditory or visual hallucination. She denies any paranoid delusions or thinking. Her attention and concentration is fair. Her thought process is logical linear and goal-directed. She's alert and oriented x3. Her insight judgment and impulse control is okay.  Lab Results:  Results for orders placed in visit on 09/29/12 (from the past 8736 hour(s))  LIPID PANEL   Collection Time    11/03/12 11:35 AM      Result Value Range   Cholesterol 246 (*) 0 - 200 mg/dL   Triglycerides 865  <784 mg/dL   HDL 53  >69 mg/dL   Total CHOL/HDL Ratio 4.6     VLDL 22  0 -  40 mg/dL   LDL Cholesterol 161 (*) 0 - 99 mg/dL  BASIC METABOLIC PANEL   Collection Time    11/03/12 11:35 AM      Result Value Range   Sodium 141  135 - 145 mEq/L   Potassium 4.1  3.5 - 5.3 mEq/L   Chloride 105  96 - 112 mEq/L   CO2 25  19 - 32 mEq/L   Glucose, Bld 95  70 - 99 mg/dL   BUN 20  6 - 23 mg/dL   Creat 0.96  0.45 - 4.09 mg/dL   Calcium 9.3  8.4 - 81.1 mg/dL   Had results from [facility] in October, of a pretty normal CBC.   Will have pt see if lipid profile was drawn at that time and get those results.   Assessment Axis I Bipolar disorder NOS Axis II deferred Axis III see medical history Axis IV mild to moderate Axis V 60-65  Plan: I took her vitals.  I reviewed CC, tobacco/med/surg Hx, meds effects/ side effects, problem list, therapies and responses as well as current situation/symptoms discussed options. Continue current effective medications. See orders and pt instructions for more details.  Medical Decision Making Problem Points:  Established problem, stable/improving (1), Review of last therapy session (1) and Review of psycho-social stressors (1) Data Points:  Review or order clinical lab tests (1) Review of medication regiment & side effects (2) and Review of other somatic treatments recommended (1)  I certify that outpatient services furnished can reasonably be expected to improve the patient's condition.   Orson Aloe, MD, [facility]

## 2013-01-27 ENCOUNTER — Encounter (HOSPITAL_COMMUNITY): Payer: Self-pay | Admitting: Psychiatry

## 2013-01-27 ENCOUNTER — Ambulatory Visit (INDEPENDENT_AMBULATORY_CARE_PROVIDER_SITE_OTHER): Payer: PRIVATE HEALTH INSURANCE | Admitting: Psychiatry

## 2013-01-27 VITALS — BP 128/79 | Ht 65.75 in | Wt 209.6 lb

## 2013-01-27 DIAGNOSIS — E669 Obesity, unspecified: Secondary | ICD-10-CM

## 2013-01-27 DIAGNOSIS — F319 Bipolar disorder, unspecified: Secondary | ICD-10-CM

## 2013-01-27 MED ORDER — TOPIRAMATE 25 MG PO TABS: 25.0000 mg | ORAL_TABLET | Freq: Two times a day (BID) | ORAL | Status: DC

## 2013-01-27 MED ORDER — BENZTROPINE MESYLATE 0.5 MG PO TABS: 0.5000 mg | ORAL_TABLET | Freq: Every day | ORAL | Status: DC

## 2013-01-27 MED ORDER — FLUOXETINE HCL 20 MG PO CAPS: 20.0000 mg | ORAL_CAPSULE | Freq: Every day | ORAL | Status: DC

## 2013-01-27 MED ORDER — OLANZAPINE 2.5 MG PO TABS: 2.5000 mg | ORAL_TABLET | Freq: Every day | ORAL | Status: DC

## 2013-01-27 NOTE — Progress Notes (Signed)
[facility] [address] Progress Note Amanda Lara MRN: [id number] DOB: [birth year] Age: 57 y.o.  Date: 01/27/2013 Start Time: 10:20 AM End Time: 10:45 AM  Chief Complaint: Chief Complaint  Patient presents with  . Depression  . Follow-up  . Medication Refill   Subjective: "I've gained some weight". Depression 1/10 and Anxiety 3/10, where 0 is none and 10 is the worst.  Pain is 0610 with her foot  History of present illness Patient came for her followup appointment.  Pt reports that she is compliant with the psychotropic medications with good benefit and no noticeable side effects.  She is struggling with weight gain.  Discussed her getting serious about her diet.  Current psychiatric medication  Zyprexa 2.5 mg at bedtime  Prozac 20 mg daily Cogentin 0.5 mg at bedtime  Topamax 25 mg twice a day Fish Oil 1000 mg twice a day.  Past psychiatric history Patient has significant history of psychiatric illness. She has at least 6 psychiatric admission with 2 suicidal attempt. She had history of noncompliant with medication resulting in decompensation. She has history of mania and severe depression. In the past she had tried Seroquel, Cymbalta, lithium, Elavil, Depakote and Haldol. She has been stable on a small dose of olanzapine Prozac and Cogentin.  Allergies: Allergies  Allergen Reactions  . Depakote (Divalproex Sodium) Other (See Comments)    Tremors so bad that she scalded herself twice with coffee.  . Lithium Nausea And Vomiting  . Reglan (Metoclopramide) Other (See Comments)    Mouth dystonia/distortion  . Codeine Nausea And Vomiting  . Latex    Medical History: Past Medical History  Diagnosis Date  . GERD (gastroesophageal reflux disease)   . Obesity   . History of UTI   . Chronic kidney disease   . Bipolar disorder   Patient see Dr. Sherryll Burger in [place]. Patient is obese with a history of GERD, UTI, and arthritis. She had shoulder surgery and recently hernia repair.   She is expected to have her left kidney removed next week.  Surgical History: Past Surgical History  Procedure Laterality Date  . Shoulder surgery    . Appendectomy    . Abdominal hysterectomy    . Hernia repair    . Left kidney and ureter removal  08/18/2012   Family History: family history includes Alcohol abuse in her brother and father; Anxiety disorder in her brother; Bipolar disorder in her maternal aunt, maternal grandmother, and mother; Dementia in her father; Depression in her brother, father, and sister; Drug abuse in her brother; and Seizures in her sister.  There is no history of ADD / ADHD, and OCD, and Paranoid behavior, and Schizophrenia, and Sexual abuse, and Physical abuse, . Patient has multiple family member who has psychiatric illness. Her brother has been a patient in this office however now he [facility].  She also has a family history of diabetes.  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 She denies any illegal drug use.  Psychosocial history Patient lives with her husband. She is currently not working. She has no children. She has good support system through her family. She is working as Agricultural consultant at [employer].  Vitals: BP 128/79  Ht 5' 5.75" (1.67 m)  Wt 209 lb 9.6 oz (95.074 kg)  BMI 34.09 kg/m2 Lost 2 pounds since last visit.  Mental status examination Patient is well-groomed and  well-dressed. She is cooperative and pleasant and maintained good eye contact. Her speech is soft and clear.  Her thought process is logical and goal-directed.  She described her mood is good and her affect is mood congruent.  She denies any active or passive suicidal thinking and homicidal thinking. She denies any auditory or visual hallucination. She denies any paranoid delusions or thinking. Her attention and concentration is fair. Her thought process is logical linear and  goal-directed. She's alert and oriented x3. Her insight judgment and impulse control is okay.  Lab Results:  Results for orders placed in visit on 09/29/12 (from the past 8736 hour(s))  LIPID PANEL   Collection Time    11/03/12 11:35 AM      Result Value Range   Cholesterol 246 (*) 0 - 200 mg/dL   Triglycerides 161  <096 mg/dL   HDL 53  >04 mg/dL   Total CHOL/HDL Ratio 4.6     VLDL 22  0 - 40 mg/dL   LDL Cholesterol 540 (*) 0 - 99 mg/dL  BASIC METABOLIC PANEL   Collection Time    11/03/12 11:35 AM      Result Value Range   Sodium 141  135 - 145 mEq/L   Potassium 4.1  3.5 - 5.3 mEq/L   Chloride 105  96 - 112 mEq/L   CO2 25  19 - 32 mEq/L   Glucose, Bld 95  70 - 99 mg/dL   BUN 20  6 - 23 mg/dL   Creat 9.81  1.91 - 4.78 mg/dL   Calcium 9.3  8.4 - 29.5 mg/dL   Had results from [facility] in October, of a pretty normal CBC.  Will have pt see if lipid profile was drawn at that time and get those results.   Assessment Axis I Bipolar disorder NOS Axis II deferred Axis III see medical history Axis IV mild to moderate Axis V 60-65  Plan: I took her vitals.  I reviewed CC, tobacco/med/surg Hx, meds effects/ side effects, problem list, therapies and responses as well as current situation/symptoms discussed options. Continue current effective medications. See orders and pt instructions for more details. MEDICATIONS this encounter: Meds ordered this encounter  Medications  . OLANZapine (ZYPREXA) 2.5 MG tablet    Sig: Take 1 tablet (2.5 mg total) by mouth at bedtime.    Dispense:  30 tablet    Refill:  2  . topiramate (TOPAMAX) 25 MG tablet    Sig: Take 1 tablet (25 mg total) by mouth 2 (two) times daily.    Dispense:  60 tablet    Refill:  2  . FLUoxetine (PROZAC) 20 MG capsule    Sig: Take 1 capsule (20 mg total) by mouth daily.    Dispense:  30 capsule    Refill:  2  . benztropine (COGENTIN) 0.5 MG tablet    Sig: Take 1 tablet (0.5 mg total) by mouth daily.    Dispense:  30  tablet    Refill:  2   Medical Decision Making Problem Points:  Established problem, stable/improving (1), Review of last therapy session (1) and Review of psycho-social stressors (1) Data Points:  Review or order clinical lab tests (1) Review of medication regiment & side effects (2) and Review of other somatic treatments recommended (1)  I certify that outpatient services furnished can reasonably be expected to improve the patient's condition.   Orson Aloe, MD, [facility]

## 2013-01-27 NOTE — Patient Instructions (Addendum)
"  Native Wisdom for [employer]" by Camelia Phenes is a book that could be very helpful.  Some have gotten it on [employer].com for very low cost.  Relaxation is the ultimate solution for you.  You can seek it through tub baths, bubble baths, essential oils or incense, walking or chatting with friends, listening to soft music, watching a candle burn and just letting all thoughts go and appreciating the true essence of the Creator.  Pets or animals may be very helpful.  You might spend some time with them and then go do more directed meditation.  "I am Wishes Fulfilled Meditation" by Marylene Buerger and Lyndal Pulley may be helpful MUSIC for getting to sleep or for meditating You can order it from on line.  You might find the Chill channel on [employer] and explore the artists that you like better.   Take care of yourself.  No one else is standing up to do the job and only you know what you need.   GET SERIOUS about taking care of yourself.  Do the next right thing and that often means doing something to care for yourself along the lines of are you hungry, are you angry, are you lonely, are you tired, are you scared?  HALTS is what that stands for.  Call if problems or concerns.  You might want to youtube the Dr Neil Crouch snipet on Diet for you personality.  Get a hobby that requires both hands.

## 2013-04-28 ENCOUNTER — Ambulatory Visit (INDEPENDENT_AMBULATORY_CARE_PROVIDER_SITE_OTHER): Payer: PRIVATE HEALTH INSURANCE | Admitting: Psychiatry

## 2013-04-28 ENCOUNTER — Encounter (HOSPITAL_COMMUNITY): Payer: Self-pay | Admitting: Psychiatry

## 2013-04-28 ENCOUNTER — Ambulatory Visit (HOSPITAL_COMMUNITY): Payer: Self-pay | Admitting: Psychiatry

## 2013-04-28 VITALS — BP 140/90 | Ht 65.0 in | Wt 212.0 lb

## 2013-04-28 DIAGNOSIS — F319 Bipolar disorder, unspecified: Secondary | ICD-10-CM

## 2013-04-28 MED ORDER — FLUOXETINE HCL 20 MG PO CAPS: 20.0000 mg | ORAL_CAPSULE | Freq: Every day | ORAL | Status: DC

## 2013-04-28 MED ORDER — OLANZAPINE 2.5 MG PO TABS: 2.5000 mg | ORAL_TABLET | Freq: Every day | ORAL | Status: DC

## 2013-04-28 MED ORDER — BENZTROPINE MESYLATE 0.5 MG PO TABS: 0.5000 mg | ORAL_TABLET | Freq: Every day | ORAL | Status: DC

## 2013-04-28 NOTE — Progress Notes (Signed)
Patient ID: Amanda Lara, female   DOB: 07/12/1956, 57 y.o.   MRN: [id number] [facility] [id number] Progress Note DORSEY CHARETTE MRN: [id number] DOB: Mar 04, 1956 Age: 57 y.o.  Date: 04/28/2013 Start Time: 10:20 AM End Time: 10:45 AM  Chief Complaint: Chief Complaint  Patient presents with  . Depression  . Medication Refill  . Follow-up   Subjective: "I'm doing really well."  This patient is a 57 year old married white female who lives in [place] with her husband they have no children. 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  The patient states she's had a history of mental illness dating back to age 7. At that time she was hospitalized for severe depression with suicidal ideation. She's had approximately 5 more hospitalizations since then. At times she's been extremely depressed and had been cutting herself 4 years ago prior to her last hospitalization. She's had bad reactions to Haldol and Depakote but is currently doing well on her current regimen.  She's sleeping well, her energy is good and her mood is good. She is enjoying all for activities. She's had no thoughts whatsoever of self-harm. She's not had any side effects from Zyprexa. Her primary doctors following her labs and her blood sugar has been normal  Current psychiatric medication  Zyprexa 2.5 mg at bedtime  Prozac 20 mg daily Cogentin 0.5 mg at bedtime    Past psychiatric history Patient has significant history of psychiatric illness. She has at least 6 psychiatric admission with 2 suicidal attempt. She had history of noncompliant with medication resulting in decompensation. She has history of mania and severe depression. In the past she had tried Seroquel, Cymbalta, lithium, Elavil, Depakote and Haldol. She has been stable on a small dose of olanzapine Prozac and Cogentin.  Allergies: Allergies  Allergen Reactions  . Depakote [Divalproex  Sodium] Other (See Comments)    Tremors so bad that she scalded herself twice with coffee.  . Lithium Nausea And Vomiting  . Reglan [Metoclopramide] Other (See Comments)    Mouth dystonia/distortion  . Codeine Nausea And Vomiting  . Latex    Medical History: Past Medical History  Diagnosis Date  . GERD (gastroesophageal reflux disease)   . Obesity   . History of UTI   . Chronic kidney disease   . Bipolar disorder   . Depression   Patient see Dr. Sherryll Burger in [place]. Patient is obese with a history of GERD, UTI, and arthritis. She had shoulder surgery and recently hernia repair.  She is expected to have her left kidney removed next week.  Surgical History: Past Surgical History  Procedure Laterality Date  . Shoulder surgery    . Appendectomy    . Abdominal hysterectomy    . Hernia repair    . Left kidney and ureter removal  08/18/2012   Family History: family history includes Alcohol abuse in her brother and father; Anxiety disorder in her brother; Bipolar disorder in her maternal aunt, maternal grandmother, and mother; Dementia in her father; Depression in her brother, father, and sister; Drug abuse in her brother; Seizures in her sister. There is no history of ADD / ADHD, OCD, Paranoid behavior, Schizophrenia, Sexual abuse, or Physical abuse. Patient has multiple family member who has psychiatric illness. Her brother has been a patient in this office however now he [facility]  She also has a family history of diabetes.  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 She denies any illegal drug use.  Psychosocial history Patient lives with her husband. She is currently not working. She has no children. She has good support system through her family. She is working as Agricultural consultant at [employer].  Vitals: BP 140/90  Ht 5\' 5"  (1.651 m)  Wt 212 lb (96.163 kg)  BMI 35.28 kg/m2 Lost 2  pounds since last visit.  Mental status examination Patient is well-groomed and well-dressed. She is cooperative and pleasant and maintained good eye contact. Her speech is soft and clear.  Her thought process is logical and goal-directed.  She described her mood is good and her affect is mood congruent.  She denies any active or passive suicidal thinking and homicidal thinking. She denies any auditory or visual hallucination. She denies any paranoid delusions or thinking. Her attention and concentration is fair. Her thought process is logical linear and goal-directed. She's alert and oriented x3. Her insight judgment and impulse control is okay.  Lab Results:  Results for orders placed in visit on 09/29/12 (from the past 8736 hour(s))  LIPID PANEL   Collection Time    11/03/12 11:35 AM      Result Value Range   Cholesterol 246 (*) 0 - 200 mg/dL   Triglycerides 409  <811 mg/dL   HDL 53  >91 mg/dL   Total CHOL/HDL Ratio 4.6     VLDL 22  0 - 40 mg/dL   LDL Cholesterol 478 (*) 0 - 99 mg/dL  BASIC METABOLIC PANEL   Collection Time    11/03/12 11:35 AM      Result Value Range   Sodium 141  135 - 145 mEq/L   Potassium 4.1  3.5 - 5.3 mEq/L   Chloride 105  96 - 112 mEq/L   CO2 25  19 - 32 mEq/L   Glucose, Bld 95  70 - 99 mg/dL   BUN 20  6 - 23 mg/dL   Creat 2.95  6.21 - 3.08 mg/dL   Calcium 9.3  8.4 - 65.7 mg/dL   She is seeing her primary doctor next month and will have labs redrawn  Assessment Axis I Bipolar disorder NOS Axis II deferred Axis III see medical history Axis IV mild to moderate Axis V 80  Plan: I took her vitals.  I reviewed CC, tobacco/med/surg Hx, meds effects/ side effects, problem list, therapies and responses as well as current situation/symptoms discussed options. Continue current effective medications. Return to clinic in 3 months See orders and pt instructions for more details. MEDICATIONS this encounter: Meds ordered this encounter  Medications  .  OLANZapine (ZYPREXA) 2.5 MG tablet    Sig: Take 1 tablet (2.5 mg total) by mouth at bedtime.    Dispense:  30 tablet    Refill:  2  . FLUoxetine (PROZAC) 20 MG capsule    Sig: Take 1 capsule (20 mg total) by mouth daily.    Dispense:  30 capsule    Refill:  2  . benztropine (COGENTIN) 0.5 MG tablet    Sig: Take 1 tablet (0.5 mg total) by mouth daily.    Dispense:  30 tablet    Refill:  2   Medical Decision Making Problem Points:  Established problem, stable/improving (1), Review of last therapy session (1) and Review of psycho-social stressors (1) Data Points:  Review or order clinical lab tests (1) Review of medication regiment & side effects (  2) and Review of other somatic treatments recommended (1)  I certify that outpatient services furnished can reasonably be expected to improve the patient's condition.   Diannia Ruder, MD

## 2013-07-28 ENCOUNTER — Ambulatory Visit (INDEPENDENT_AMBULATORY_CARE_PROVIDER_SITE_OTHER): Payer: PRIVATE HEALTH INSURANCE | Admitting: Psychiatry

## 2013-07-28 ENCOUNTER — Encounter (HOSPITAL_COMMUNITY): Payer: Self-pay | Admitting: Psychiatry

## 2013-07-28 VITALS — BP 130/82 | Ht 65.0 in | Wt 211.0 lb

## 2013-07-28 DIAGNOSIS — F319 Bipolar disorder, unspecified: Secondary | ICD-10-CM

## 2013-07-28 MED ORDER — FLUOXETINE HCL 20 MG PO CAPS: 20.0000 mg | ORAL_CAPSULE | Freq: Every day | ORAL | Status: DC

## 2013-07-28 MED ORDER — OLANZAPINE 2.5 MG PO TABS: 2.5000 mg | ORAL_TABLET | Freq: Every day | ORAL | Status: DC

## 2013-07-28 MED ORDER — BENZTROPINE MESYLATE 0.5 MG PO TABS: 0.5000 mg | ORAL_TABLET | Freq: Every day | ORAL | Status: DC

## 2013-07-28 NOTE — Progress Notes (Signed)
Patient ID: Amanda Lara, female   DOB: 30-Jan-1956, 57 y.o.   MRN: [id number] Patient ID: Amanda Lara, female   DOB: 02-27-1956, 57 y.o.   MRN: [id number] [facility] [address] Progress Note Amanda Lara MRN: [id number] DOB: 1956-02-18 Age: 57 y.o.  Date: 07/28/2013 Start Time: 10:20 AM End Time: 10:45 AM  Chief Complaint: Chief Complaint  Patient presents with  . Anxiety  . Depression  . Follow-up   Subjective: "I'm doing really well."  This patient is a 57 year old married white female who lives in [place] with her husband they have no children. 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  The patient states she's had a history of mental illness dating back to age 33. At that time she was hospitalized for severe depression with suicidal ideation. She's had approximately 5 more hospitalizations since then. At times she's been extremely depressed and had been cutting herself 4 years ago prior to her last hospitalization. She's had bad reactions to Haldol and Depakote but is currently doing well on her current regimen.  She's sleeping well, her energy is good and her mood is good. She is enjoying all for activities. She's had no thoughts whatsoever of self-harm. She's not had any side effects from Zyprexa. Her primary doctors following her labs and her blood sugar has been normal  The patient returns after 3 months. She continues to do very well. She indicates that her recent laboratories showed elevated cholesterol and LDL. Her doctors are prescribing anything but asked her to work with diet and exercise and she's going to try to do this. Her blood pressure is good and her blood sugar remains normal. Her mood is been stable she is sleeping well her energy is good  Current psychiatric medication  Zyprexa 2.5 mg at bedtime  Prozac 20 mg daily Cogentin 0.5 mg at bedtime    Past psychiatric history Patient has  significant history of psychiatric illness. She has at least 6 psychiatric admission with 2 suicidal attempt. She had history of noncompliant with medication resulting in decompensation. She has history of mania and severe depression. In the past she had tried Seroquel, Cymbalta, lithium, Elavil, Depakote and Haldol. She has been stable on a small dose of olanzapine Prozac and Cogentin.  Allergies: Allergies  Allergen Reactions  . Depakote [Divalproex Sodium] Other (See Comments)    Tremors so bad that she scalded herself twice with coffee.  . Lithium Nausea And Vomiting  . Reglan [Metoclopramide] Other (See Comments)    Mouth dystonia/distortion  . Codeine Nausea And Vomiting  . Latex    Medical History: Past Medical History  Diagnosis Date  . GERD (gastroesophageal reflux disease)   . Obesity   . History of UTI   . Chronic kidney disease   . Bipolar disorder   . Depression   Patient see Dr. Sherryll Burger in [place]. Patient is obese with a history of GERD, UTI, and arthritis. She had shoulder surgery and recently hernia repair.  She is expected to have her left kidney removed next week.  Surgical History: Past Surgical History  Procedure Laterality Date  . Shoulder surgery    . Appendectomy    . Abdominal hysterectomy    . Hernia repair    . Left kidney and ureter removal  08/18/2012   Family History: family history includes Alcohol abuse in her brother and father; Anxiety disorder in her brother; Bipolar disorder in her  maternal aunt, maternal grandmother, and mother; Dementia in her father; Depression in her brother, father, and sister; Drug abuse in her brother; Seizures in her sister. There is no history of ADD / ADHD, OCD, Paranoid behavior, Schizophrenia, Sexual abuse, or Physical abuse. Patient has multiple family member who has psychiatric illness. Her brother has been a patient in this office however now he sees [facility].  She also has a family history of  diabetes.  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 She denies any illegal drug use.  Psychosocial history Patient lives with her husband. She is currently not working. She has no children. She has good support system through her family. She is working as Agricultural consultant at [employer].  Vitals: BP 130/82  Ht 5\' 5"  (1.651 m)  Wt 211 lb (95.709 kg)  BMI 35.11 kg/m2 Lost 2 pounds since last visit.  Mental status examination Patient is well-groomed and well-dressed. She is cooperative and pleasant and maintained good eye contact. Her speech is soft and clear.  Her thought process is logical and goal-directed.  She described her mood is good and her affect is mood congruent.  She denies any active or passive suicidal thinking and homicidal thinking. She denies any auditory or visual hallucination. She denies any paranoid delusions or thinking. Her attention and concentration is fair. Her thought process is logical linear and goal-directed. She's alert and oriented x3. Her insight judgment and impulse control is okay.  Lab Results:  Results for orders placed in visit on 09/29/12 (from the past 8736 hour(s))  LIPID PANEL   Collection Time    11/03/12 11:35 AM      Result Value Range   Cholesterol 246 (*) 0 - 200 mg/dL   Triglycerides 962  <952 mg/dL   HDL 53  >84 mg/dL   Total CHOL/HDL Ratio 4.6     VLDL 22  0 - 40 mg/dL   LDL Cholesterol 132 (*) 0 - 99 mg/dL  BASIC METABOLIC PANEL   Collection Time    11/03/12 11:35 AM      Result Value Range   Sodium 141  135 - 145 mEq/L   Potassium 4.1  3.5 - 5.3 mEq/L   Chloride 105  96 - 112 mEq/L   CO2 25  19 - 32 mEq/L   Glucose, Bld 95  70 - 99 mg/dL   BUN 20  6 - 23 mg/dL   Creat 4.40  1.02 - 7.25 mg/dL   Calcium 9.3  8.4 - 36.6 mg/dL   She is seeing her primary doctor next month and will have labs redrawn  Assessment Axis I Bipolar disorder NOS Axis  II deferred Axis III see medical history Axis IV mild to moderate Axis V 80  Plan: I took her vitals.  I reviewed CC, tobacco/med/surg Hx, meds effects/ side effects, problem list, therapies and responses as well as current situation/symptoms discussed options. Continue current effective medications. Return to clinic in 3 months See orders and pt instructions for more details. MEDICATIONS this encounter: Meds ordered this encounter  Medications  . OLANZapine (ZYPREXA) 2.5 MG tablet    Sig: Take 1 tablet (2.5 mg total) by mouth at bedtime.    Dispense:  30 tablet    Refill:  2  . FLUoxetine (PROZAC) 20 MG capsule    Sig: Take 1 capsule (20 mg total) by mouth daily.  Dispense:  30 capsule    Refill:  2  . benztropine (COGENTIN) 0.5 MG tablet    Sig: Take 1 tablet (0.5 mg total) by mouth daily.    Dispense:  30 tablet    Refill:  2   Medical Decision Making Problem Points:  Established problem, stable/improving (1), Review of last therapy session (1) and Review of psycho-social stressors (1) Data Points:  Review or order clinical lab tests (1) Review of medication regiment & side effects (2) and Review of other somatic treatments recommended (1)  I certify that outpatient services furnished can reasonably be expected to improve the patient's condition.   Diannia Ruder, MD

## 2013-10-26 ENCOUNTER — Ambulatory Visit (INDEPENDENT_AMBULATORY_CARE_PROVIDER_SITE_OTHER): Payer: BC Managed Care – PPO | Admitting: Psychiatry

## 2013-10-26 ENCOUNTER — Encounter (HOSPITAL_COMMUNITY): Payer: Self-pay | Admitting: Psychiatry

## 2013-10-26 VITALS — BP 130/80 | Ht 65.0 in | Wt 215.0 lb

## 2013-10-26 DIAGNOSIS — F319 Bipolar disorder, unspecified: Secondary | ICD-10-CM

## 2013-10-26 MED ORDER — FLUOXETINE HCL 20 MG PO CAPS: 20.0000 mg | ORAL_CAPSULE | Freq: Every day | ORAL | Status: DC

## 2013-10-26 MED ORDER — OLANZAPINE 2.5 MG PO TABS: 2.5000 mg | ORAL_TABLET | Freq: Every day | ORAL | Status: DC

## 2013-10-26 MED ORDER — BENZTROPINE MESYLATE 0.5 MG PO TABS: 0.5000 mg | ORAL_TABLET | Freq: Every day | ORAL | Status: DC

## 2013-10-26 NOTE — Progress Notes (Signed)
Patient ID: Amanda Lara, female   DOB: 1955/08/29, 58 y.o.   MRN: [id number] Patient ID: Amanda Lara, female   DOB: 1956-06-18, 58 y.o.   MRN: [id number] Patient ID: Amanda Lara, female   DOB: 1956/03/15, 58 y.o.   MRN: [id number] [facility] [address] Progress Note Amanda Lara MRN: [id number] DOB: [birth year] Age: 58 y.o.  Date: 10/26/2013 Start Time: 10:20 AM End Time: 10:45 AM  Chief Complaint: Chief Complaint  Patient presents with  . Anxiety  . Depression  . Follow-up   Subjective: "I'm doing really well."  This patient is a 57 year old married white female who lives in [place] with her husband they have no children. 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  The patient states she's had a history of mental illness dating back to age 72. At that time she was hospitalized for severe depression with suicidal ideation. She's had approximately 5 more hospitalizations since then. At times she's been extremely depressed and had been cutting herself 4 years ago prior to her last hospitalization. She's had bad reactions to Haldol and Depakote but is currently doing well on her current regimen.  She's sleeping well, her energy is good and her mood is good. She is enjoying all for activities. She's had no thoughts whatsoever of self-harm. She's not had any side effects from Zyprexa. Her primary doctors following her labs and her blood sugar has been normal  The patient returns after 3 months. She continues to do very well. She is staying busy with her volunteer work. Her mood is good. She's not had any symptoms of mania or depression. She is continuing to do very well on her current regimen Current psychiatric medication  Zyprexa 2.5 mg at bedtime  Prozac 20 mg daily Cogentin 0.5 mg at bedtime    Past psychiatric history Patient has significant history of psychiatric illness. She has at least 6 psychiatric  admission with 2 suicidal attempt. She had history of noncompliant with medication resulting in decompensation. She has history of mania and severe depression. In the past she had tried Seroquel, Cymbalta, lithium, Elavil, Depakote and Haldol. She has been stable on a small dose of olanzapine Prozac and Cogentin.  Allergies: Allergies  Allergen Reactions  . Depakote [Divalproex Sodium] Other (See Comments)    Tremors so bad that she scalded herself twice with coffee.  . Lithium Nausea And Vomiting  . Reglan [Metoclopramide] Other (See Comments)    Mouth dystonia/distortion  . Codeine Nausea And Vomiting  . Latex    Medical History: Past Medical History  Diagnosis Date  . GERD (gastroesophageal reflux disease)   . Obesity   . History of UTI   . Chronic kidney disease   . Bipolar disorder   . Depression   Patient see Dr. Manuella Ghazi in [place]. Patient is obese with a history of GERD, UTI, and arthritis. She had shoulder surgery and recently hernia repair.  She is expected to have her left kidney removed next week.  Surgical History: Past Surgical History  Procedure Laterality Date  . Shoulder surgery    . Appendectomy    . Abdominal hysterectomy    . Hernia repair    . Left kidney and ureter removal  08/18/2012   Family History: family history includes Alcohol abuse in her brother and father; Anxiety disorder in her brother; Bipolar disorder in her maternal aunt, maternal grandmother, and mother; Dementia in her father;  Depression in her brother, father, and sister; Drug abuse in her brother; Seizures in her sister. There is no history of ADD / ADHD, OCD, Paranoid behavior, Schizophrenia, Sexual abuse, or Physical abuse. Patient has multiple family member who has psychiatric illness. Her brother has been a patient in this office however now he [facility]  She also has a family history of diabetes.  Alcohol and substance use history Patient has history of alcohol use in  the past however claims to be sober for past 10 years. She denies any illegal drug use.  Psychosocial history Patient lives with her husband. She is currently not working. She has no children. She has good support system through her family. She is working as Psychologist, occupational at [employer].  Vitals: BP 130/80  Ht 5\' 5"  (1.651 m)  Wt 215 lb (97.523 kg)  BMI 35.78 kg/m2 Lost 2 pounds since last visit.  Mental status examination Patient is well-groomed and well-dressed. She is cooperative and pleasant and maintained good eye contact. Her speech is soft and clear.  Her thought process is logical and goal-directed.  She described her mood is good and her affect is mood congruent.  She denies any active or passive suicidal thinking and homicidal thinking. She denies any auditory or visual hallucination. She denies any paranoid delusions or thinking. Her attention and concentration is fair. Her thought process is logical linear and goal-directed. She's alert and oriented x3. Her insight judgment and impulse control is okay.  Lab Results:  Results for orders placed in visit on 09/29/12 (from the past 8736 hour(s))  LIPID PANEL   Collection Time    11/03/12 11:35 AM      Result Value Ref Range   Cholesterol 246 (*) 0 - 200 mg/dL   Triglycerides 112  <150 mg/dL   HDL 53  >39 mg/dL   Total CHOL/HDL Ratio 4.6     VLDL 22  0 - 40 mg/dL   LDL Cholesterol 171 (*) 0 - 99 mg/dL  BASIC METABOLIC PANEL   Collection Time    11/03/12 11:35 AM      Result Value Ref Range   Sodium 141  135 - 145 mEq/L   Potassium 4.1  3.5 - 5.3 mEq/L   Chloride 105  96 - 112 mEq/L   CO2 25  19 - 32 mEq/L   Glucose, Bld 95  70 - 99 mg/dL   BUN 20  6 - 23 mg/dL   Creat 1.06  0.50 - 1.10 mg/dL   Calcium 9.3  8.4 - 10.5 mg/dL   She is seeing her primary doctor next month and will have labs redrawn  Assessment Axis I Bipolar disorder NOS Axis II deferred Axis III see medical history Axis IV mild to moderate Axis V  80  Plan: I took her vitals.  I reviewed CC, tobacco/med/surg Hx, meds effects/ side effects, problem list, therapies and responses as well as current situation/symptoms discussed options. Continue current effective medications. Return to clinic in 3 months See orders and pt instructions for more details. MEDICATIONS this encounter: Meds ordered this encounter  Medications  . benztropine (COGENTIN) 0.5 MG tablet    Sig: Take 1 tablet (0.5 mg total) by mouth daily.    Dispense:  30 tablet    Refill:  2  . OLANZapine (ZYPREXA) 2.5 MG tablet    Sig: Take 1 tablet (2.5 mg total) by mouth at bedtime.    Dispense:  30 tablet  Refill:  2  . FLUoxetine (PROZAC) 20 MG capsule    Sig: Take 1 capsule (20 mg total) by mouth daily.    Dispense:  30 capsule    Refill:  2   Medical Decision Making Problem Points:  Established problem, stable/improving (1), Review of last therapy session (1) and Review of psycho-social stressors (1) Data Points:  Review or order clinical lab tests (1) Review of medication regiment & side effects (2) and Review of other somatic treatments recommended (1)  I certify that outpatient services furnished can reasonably be expected to improve the patient's condition.   Levonne Spiller, MD

## 2014-01-15 ENCOUNTER — Encounter (HOSPITAL_COMMUNITY): Payer: Self-pay | Admitting: Emergency Medicine

## 2014-01-15 ENCOUNTER — Emergency Department (HOSPITAL_COMMUNITY): Payer: BC Managed Care – PPO

## 2014-01-15 ENCOUNTER — Emergency Department (HOSPITAL_COMMUNITY)
Admission: EM | Admit: 2014-01-15 | Discharge: 2014-01-15 | Disposition: A | Discharge status: Home / Self Care | Payer: BC Managed Care – PPO | Attending: Emergency Medicine | Admitting: Emergency Medicine

## 2014-01-15 DIAGNOSIS — Y9289 Other specified places as the place of occurrence of the external cause: Secondary | ICD-10-CM | POA: Insufficient documentation

## 2014-01-15 DIAGNOSIS — S60219A Contusion of unspecified wrist, initial encounter: Secondary | ICD-10-CM | POA: Insufficient documentation

## 2014-01-15 DIAGNOSIS — Z87440 Personal history of urinary (tract) infections: Secondary | ICD-10-CM | POA: Insufficient documentation

## 2014-01-15 DIAGNOSIS — K219 Gastro-esophageal reflux disease without esophagitis: Secondary | ICD-10-CM | POA: Insufficient documentation

## 2014-01-15 DIAGNOSIS — Z79899 Other long term (current) drug therapy: Secondary | ICD-10-CM | POA: Insufficient documentation

## 2014-01-15 DIAGNOSIS — S5290XA Unspecified fracture of unspecified forearm, initial encounter for closed fracture: Secondary | ICD-10-CM | POA: Insufficient documentation

## 2014-01-15 DIAGNOSIS — F319 Bipolar disorder, unspecified: Secondary | ICD-10-CM | POA: Insufficient documentation

## 2014-01-15 DIAGNOSIS — N189 Chronic kidney disease, unspecified: Secondary | ICD-10-CM | POA: Insufficient documentation

## 2014-01-15 DIAGNOSIS — Z91040 Latex allergy status: Secondary | ICD-10-CM | POA: Insufficient documentation

## 2014-01-15 DIAGNOSIS — Y9389 Activity, other specified: Secondary | ICD-10-CM | POA: Insufficient documentation

## 2014-01-15 DIAGNOSIS — E669 Obesity, unspecified: Secondary | ICD-10-CM | POA: Insufficient documentation

## 2014-01-15 DIAGNOSIS — W010XXA Fall on same level from slipping, tripping and stumbling without subsequent striking against object, initial encounter: Secondary | ICD-10-CM | POA: Insufficient documentation

## 2014-01-15 DIAGNOSIS — S62109A Fracture of unspecified carpal bone, unspecified wrist, initial encounter for closed fracture: Secondary | ICD-10-CM | POA: Insufficient documentation

## 2014-01-15 MED ORDER — OXYCODONE-ACETAMINOPHEN 5-325 MG PO TABS
1.0000 | ORAL_TABLET | Freq: Once | ORAL | Status: AC
  Administered 2014-01-15: 1 via ORAL
  Filled 2014-01-15: qty 1

## 2014-01-15 MED ORDER — OXYCODONE-ACETAMINOPHEN 5-325 MG PO TABS: 1.0000 | ORAL_TABLET | ORAL | Status: DC | PRN

## 2014-01-15 NOTE — ED Notes (Addendum)
Slipped on wet floor and fell on outstretched L hand.  Obvious deformity and swelling.  Icing wrist presently.

## 2014-01-15 NOTE — ED Notes (Signed)
CMS intact at time of d/c

## 2014-01-15 NOTE — ED Provider Notes (Signed)
CSN: [id number]     Arrival date & time 01/15/14  1352 History   First MD Initiated Contact with Patient 01/15/14 1625     Chief Complaint  Patient presents with  . Wrist Pain     (Consider location/radiation/quality/duration/timing/severity/associated sxs/prior Treatment) Patient is a 58 y.o. female presenting with wrist pain. The history is provided by the patient.  Wrist Pain This is a new problem. Episode onset: several hours prior to ED arrival. The problem occurs constantly. The problem has been unchanged. Associated symptoms include arthralgias and joint swelling. Pertinent negatives include no chills, fever, headaches, nausea, neck pain, numbness, rash, vomiting or weakness. The symptoms are aggravated by twisting and bending (movement of the wrist). She has tried ice and immobilization for the symptoms. The treatment provided moderate relief.   Patient complain of pain and swelling to her left wrist that began suddenly after a mechanical fall onto an outstretched hand.  She states the pain worsens with movement and improves if the wrist is held in one position.  She denies laceration, numbness or weakness, neck or shoulder pain, head injury or LOC.  Patient is right hand dominant   Past Medical History  Diagnosis Date  . GERD (gastroesophageal reflux disease)   . Obesity   . History of UTI   . Chronic kidney disease   . Bipolar disorder   . Depression    Past Surgical History  Procedure Laterality Date  . Shoulder surgery    . Appendectomy    . Abdominal hysterectomy    . Hernia repair    . Left kidney and ureter removal  08/18/2012   Family History  Problem Relation Age of Onset  . Dementia Father   . Depression Father   . Alcohol abuse Father   . Depression Brother   . Drug abuse Brother   . Alcohol abuse Brother   . Anxiety disorder Brother   . Depression Sister   . Seizures Sister   . Bipolar disorder Mother   . Bipolar disorder Maternal Aunt   . Bipolar  disorder Maternal Grandmother   . ADD / ADHD Neg Hx   . OCD Neg Hx   . Paranoid behavior Neg Hx   . Schizophrenia Neg Hx   . Sexual abuse Neg Hx   . Physical abuse Neg Hx    History  Substance Use Topics  . Smoking status: Never Smoker   . Smokeless tobacco: Never Used  . Alcohol Use: No   OB History   Grav Para Term Preterm Abortions TAB SAB Ect Mult Living                 Review of Systems  Constitutional: Negative for fever and chills.  Gastrointestinal: Negative for nausea and vomiting.  Genitourinary: Negative for dysuria and difficulty urinating.  Musculoskeletal: Positive for arthralgias and joint swelling. Negative for neck pain.  Skin: Negative for color change, rash and wound.  Neurological: Negative for weakness, numbness and headaches.  All other systems reviewed and are negative.     Allergies  Depakote; Lithium; Reglan; Codeine; and Latex  Home Medications   Prior to Admission medications   Medication Sig Start Date End Date Taking? Authorizing Provider  benztropine (COGENTIN) 0.5 MG tablet Take 1 tablet (0.5 mg total) by mouth daily. 10/26/13  Yes Levonne Spiller, MD  Fish Oil-Cholecalciferol (FISH OIL + D3 PO) Take 1 capsule by mouth at bedtime.   Yes Historical Provider, MD  FLUoxetine (PROZAC) 20 MG capsule Take  1 capsule (20 mg total) by mouth daily. 10/26/13  Yes Levonne Spiller, MD  Multiple Vitamins-Minerals (HAIR/SKIN/NAILS PO) Take 1 capsule by mouth at bedtime.   Yes Historical Provider, MD  naproxen sodium (ANAPROX) 220 MG tablet Take 220 mg by mouth daily as needed.   Yes Historical Provider, MD  OLANZapine (ZYPREXA) 2.5 MG tablet Take 1 tablet (2.5 mg total) by mouth at bedtime. 10/26/13  Yes Levonne Spiller, MD  pantoprazole (PROTONIX) 40 MG tablet Take 40 mg by mouth daily.     Yes Historical Provider, MD   BP 143/73  Pulse 64  Temp(Src) 97.4 F (36.3 C) (Oral)  Resp 18  Ht 5\' 6"  (1.676 m)  Wt 215 lb (97.523 kg)  BMI 34.72 kg/m2  SpO2  100% Physical Exam  Nursing note and vitals reviewed. Constitutional: She is oriented to person, place, and time. She appears well-developed and well-nourished. No distress.  HENT:  Head: Normocephalic and atraumatic.  Neck: Normal range of motion. Neck supple.  Cardiovascular: Normal rate, regular rhythm, normal heart sounds and intact distal pulses.   No murmur heard. Pulmonary/Chest: Effort normal and breath sounds normal. No respiratory distress. She exhibits no tenderness.  Musculoskeletal: She exhibits edema and tenderness.  Moderate STS and deformity of the distal left wrist.  No open wounds.  Moderate ecchymosis to the volar wrist. Radial pulse is brisk, distal sensation intact.  CR< 2 sec.  Pt has full ROM of the fingers. Forearm, elbow and shoulder are NT.     Neurological: She is alert and oriented to person, place, and time. She exhibits normal muscle tone. Coordination normal.  Skin: Skin is warm and dry.    ED Course  Procedures (including critical care time) Labs Review Labs Reviewed - No data to display  Imaging Review Dg Wrist Complete Left  01/15/2014   CLINICAL DATA:  Fall, wrist pain  EXAM: LEFT WRIST - COMPLETE 3+ VIEW  COMPARISON:  None.  FINDINGS: Four views of the left wrist submitted. There is mild impacted, mild angulated fracture in distal left radial metaphysis. Mild displaced fracture of the ulnar styloid. Significant soft tissue swelling palmar aspect of the wrist.  IMPRESSION: Mild impacted mild angulated fracture in distal left radial metaphysis. Mild displaced fracture of the ulnar styloid.   Electronically Signed   By: Lahoma Crocker M.D.   On: 01/15/2014 14:42     EKG Interpretation None      MDM   Final diagnoses:  Wrist fracture, closed   Patient with obvious deformity to distal left wrist.  NV intact.  No local orthopedic coverage, so I will consult hand surgeon in [place].    Pt is noted to have three rings to the left ring finger with mild  STS to the finger finger.  Pt agrees to allow rings to be removed.  Nursing removed rings from the left ring finger with ring cutter.  Pt tolerated procedure well.  No complications  4:78 PM Consulted Dr. Caralyn Guile. He reviewed XR's and recommended to have EDP perform hematoma block and reduce the deformity, splint and he will see in office on Thursday or Friday.  I discussed this with Dr. Thurnell Garbe, who stated that is a specialty trained procedure that she has not been adequately trained to perform, and pt would be better served to have performed by orthopedics.    17:30  Consulted Dr. Caralyn Guile again and discussed situation.  He now recommended patient be placed in a sugar tong splint and he will see  pt in his office on Tuesday.    On recheck , pt is feeling much better after splint and sling applied, remains NV intact.  She agrees to elevate, ice and f/u.  She is ambulatory and appears stable for d/c  Ryker Sudbury L. Glenice Ciccone, PA-C 01/17/14 1210

## 2014-01-15 NOTE — Discharge Instructions (Signed)
Wrist Fracture °A wrist fracture is a break in one of the wrist bones. A cast or splint is used to keep the injured bones from moving. The cast or splint is often on for 4 6 weeks. °HOME CARE °· Keep your injured wrist raised (elevated). Move your fingers as much as possible. °· Put ice on the injured area for the first 1 2 days after you have been treated or as told by your doctor. °· Put ice in a plastic bag. °· Place a towel between your skin and the bag. °· Leave the ice on for 15-20 minutes at a time, every 2 hours while you are awake. °· Do not put pressure on any part of your cast or splint. °· Protect your cast or splint with a plastic bag before bathing or showering. Do not lower your cast or splint into water. °· Only take medicine as told by your doctor. °GET HELP RIGHT AWAY IF:  °· Your cast or splint gets damaged or breaks. °· You have very bad pain that does not go away. °· You have more puffiness (swelling) than before the cast was put on. °· Your skin or fingernails below the injured area turn blue, gray, or feel cold or numb. °· You lose some of your feeling in the fingers. °MAKE SURE YOU:  °· Understand these instructions. °· Will watch your condition. °· Will get help right away if you are not doing well or get worse. °Document Released: 01/30/2008 Document Revised: 05/07/2012 Document Reviewed: 02/25/2012 °[employer]® Patient Information ©2014 [employer]. ° °

## 2014-01-15 NOTE — ED Notes (Signed)
Deformity of lt wrist. After fall in her home.  Has rings on ring finger that she says will not usually come off.  Good radial pulse.and distal sensation.  Ice pack in place.

## 2014-01-17 NOTE — ED Provider Notes (Signed)
Medical screening examination/treatment/procedure(s) were performed by non-physician practitioner and as supervising physician I was immediately available for consultation/collaboration.   EKG Interpretation None        Alfonzo Feller, DO 01/17/14 1245

## 2014-01-25 ENCOUNTER — Ambulatory Visit (HOSPITAL_COMMUNITY): Payer: Self-pay | Admitting: Psychiatry

## 2014-01-26 ENCOUNTER — Ambulatory Visit (HOSPITAL_COMMUNITY): Payer: Self-pay | Admitting: Psychiatry

## 2014-01-26 ENCOUNTER — Ambulatory Visit (INDEPENDENT_AMBULATORY_CARE_PROVIDER_SITE_OTHER): Payer: BC Managed Care – PPO | Admitting: Psychiatry

## 2014-01-26 ENCOUNTER — Encounter (HOSPITAL_COMMUNITY): Payer: Self-pay | Admitting: Psychiatry

## 2014-01-26 VITALS — BP 130/80 | Ht 66.0 in | Wt 214.0 lb

## 2014-01-26 DIAGNOSIS — F319 Bipolar disorder, unspecified: Secondary | ICD-10-CM

## 2014-01-26 MED ORDER — OLANZAPINE 2.5 MG PO TABS: 2.5000 mg | ORAL_TABLET | Freq: Every day | ORAL | Status: DC

## 2014-01-26 MED ORDER — BENZTROPINE MESYLATE 0.5 MG PO TABS: 0.5000 mg | ORAL_TABLET | Freq: Every day | ORAL | Status: DC

## 2014-01-26 MED ORDER — FLUOXETINE HCL 20 MG PO CAPS
20.0000 mg | ORAL_CAPSULE | Freq: Every day | ORAL | Status: DC
Start: 2014-01-26 — End: 2014-04-28

## 2014-01-26 NOTE — Progress Notes (Signed)
Patient ID: Amanda Lara, female   DOB: Jul 25, 1956, 58 y.o.   MRN: [id number] Patient ID: Amanda Lara, female   DOB: 26-Jul-1956, 58 y.o.   MRN: [id number] Patient ID: Amanda Lara, female   DOB: Apr 13, 1956, 58 y.o.   MRN: [id number] Patient ID: Amanda Lara, female   DOB: [birth year], 58 y.o.   MRN: [id number] [facility] [address] Progress Note Amanda Lara MRN: [id number] DOB: 16-Apr-1956 Age: 58 y.o.  Date: 01/26/2014 Start Time: 10:20 AM End Time: 10:45 AM  Chief Complaint: Chief Complaint  Patient presents with  . Anxiety  . Depression  . Follow-up   Subjective: "I broke my wrist."  This patient is a 58 year old married white female who lives in [place] with her husband they have no children. 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  The patient states she's had a history of mental illness dating back to age 58. At that time she was hospitalized for severe depression with suicidal ideation. She's had approximately 5 more hospitalizations since then. At times she's been extremely depressed and had been cutting herself 4 years ago prior to her last hospitalization. She's had bad reactions to Haldol and Depakote but is currently doing well on her current regimen.  She's sleeping well, her energy is good and her mood is good. She is enjoying all for activities. She's had no thoughts whatsoever of self-harm. She's not had any side effects from Zyprexa. Her primary doctors following her labs and her blood sugar has been normal  The patient returns after 3 months. She fell a couple of weeks ago after she mopped her kitchen floor and broke her right wrist. She just had surgery last week and is in a cast. She's been in a fair amount of pain. She's also been more and anxious about walking around her house now. I'm reluctant to give her benzodiazepines for anxiety because she is on pain meds and it could also make her  lightheaded and fall again. Her mood is generally been good and she's had no breakthrough psychiatric symptoms. I suggested if the anxiety persists we could get her in to a counselor here and she agrees Current psychiatric medication  Zyprexa 2.5 mg at bedtime  Prozac 20 mg daily Cogentin 0.5 mg at bedtime    Past psychiatric history Patient has significant history of psychiatric illness. She has at least 6 psychiatric admission with 2 suicidal attempt. She had history of noncompliant with medication resulting in decompensation. She has history of mania and severe depression. In the past she had tried Seroquel, Cymbalta, lithium, Elavil, Depakote and Haldol. She has been stable on a small dose of olanzapine Prozac and Cogentin.  Allergies: Allergies  Allergen Reactions  . Depakote [Divalproex Sodium] Other (See Comments)    Tremors so bad that she scalded herself twice with coffee.  . Lithium Nausea And Vomiting  . Reglan [Metoclopramide] Other (See Comments)    Mouth dystonia/distortion  . Codeine Nausea And Vomiting  . Latex    Medical History: Past Medical History  Diagnosis Date  . GERD (gastroesophageal reflux disease)   . Obesity   . History of UTI   . Chronic kidney disease   . Bipolar disorder   . Depression   Patient see Dr. Manuella Ghazi in [place]. Patient is obese with a history of GERD, UTI, and arthritis. She had shoulder surgery and recently hernia repair.  She is expected to  have her left kidney removed next week.  Surgical History: Past Surgical History  Procedure Laterality Date  . Shoulder surgery    . Appendectomy    . Abdominal hysterectomy    . Hernia repair    . Left kidney and ureter removal  08/18/2012   Family History: family history includes Alcohol abuse in her brother and father; Anxiety disorder in her brother; Bipolar disorder in her maternal aunt, maternal grandmother, and mother; Dementia in her father; Depression in her brother, father, and sister; Drug  abuse in her brother; Seizures in her sister. There is no history of ADD / ADHD, OCD, Paranoid behavior, Schizophrenia, Sexual abuse, or Physical abuse. Patient has multiple family member who has psychiatric illness. Her brother has been a patient in this office however now he sees [facility]  She also has a family history of diabetes.  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 She denies any illegal drug use.  Psychosocial history Patient lives with her husband. She is currently not working. She has no children. She has good support system through her family. She is working as Psychologist, occupational at [employer].  Vitals: BP 130/80  Ht 5\' 6"  (1.676 m)  Wt 214 lb (97.07 kg)  BMI 34.56 kg/m2 Lost 2 pounds since last visit.  Mental status examination Patient is well-groomed and well-dressed. He has a large cast on her right arm She is cooperative and pleasant and maintained good eye contact. Her speech is soft and clear.  Her thought process is logical and goal-directed.  She described her mood is good and her affect is mood congruent. She is a bit more anxious particularly about walking in her house  She denies any active or passive suicidal thinking and homicidal thinking. She denies any auditory or visual hallucination. She denies any paranoid delusions or thinking. Her attention and concentration is fair. Her thought process is logical linear and goal-directed. She's alert and oriented x3. Her insight judgment and impulse control is okay.  Lab Results:  No results found for this or any previous visit (from the past 8736 hour(s)). She is seeing her primary doctor next month and will have labs redrawn  Assessment Axis I Bipolar disorder NOS Axis II deferred Axis III see medical history Axis IV mild to moderate Axis V 80  Plan: I took her vitals.  I reviewed CC, tobacco/med/surg Hx, meds effects/  side effects, problem list, therapies and responses as well as current situation/symptoms discussed options. Continue current effective medications. Return to clinic in 3 months but call if her anxiety worsens See orders and pt instructions for more details. MEDICATIONS this encounter: Meds ordered this encounter  Medications  . benztropine (COGENTIN) 0.5 MG tablet    Sig: Take 1 tablet (0.5 mg total) by mouth daily.    Dispense:  30 tablet    Refill:  2  . OLANZapine (ZYPREXA) 2.5 MG tablet    Sig: Take 1 tablet (2.5 mg total) by mouth at bedtime.    Dispense:  30 tablet    Refill:  2  . FLUoxetine (PROZAC) 20 MG capsule    Sig: Take 1 capsule (20 mg total) by mouth daily.    Dispense:  30 capsule    Refill:  2   Medical Decision Making Problem Points:  Established problem, stable/improving (1), Review of last therapy session (1) and Review of psycho-social stressors (1)  Data Points:  Review or order clinical lab tests (1) Review of medication regiment & side effects (2) and Review of other somatic treatments recommended (1)  I certify that outpatient services furnished can reasonably be expected to improve the patient's condition.   Levonne Spiller, MD

## 2014-02-01 ENCOUNTER — Ambulatory Visit (HOSPITAL_COMMUNITY): Payer: Self-pay | Admitting: Psychiatry

## 2014-04-28 ENCOUNTER — Ambulatory Visit (INDEPENDENT_AMBULATORY_CARE_PROVIDER_SITE_OTHER): Payer: BC Managed Care – PPO | Admitting: Psychiatry

## 2014-04-28 ENCOUNTER — Encounter (HOSPITAL_COMMUNITY): Payer: Self-pay | Admitting: Psychiatry

## 2014-04-28 VITALS — BP 105/86 | HR 98 | Ht 66.0 in | Wt 206.8 lb

## 2014-04-28 DIAGNOSIS — F319 Bipolar disorder, unspecified: Secondary | ICD-10-CM

## 2014-04-28 DIAGNOSIS — F3130 Bipolar disorder, current episode depressed, mild or moderate severity, unspecified: Secondary | ICD-10-CM

## 2014-04-28 MED ORDER — OLANZAPINE 5 MG PO TABS: 5.0000 mg | ORAL_TABLET | Freq: Every day | ORAL | Status: DC

## 2014-04-28 MED ORDER — FLUOXETINE HCL 20 MG PO CAPS: 20.0000 mg | ORAL_CAPSULE | Freq: Every day | ORAL | Status: DC

## 2014-04-28 MED ORDER — BENZTROPINE MESYLATE 0.5 MG PO TABS: 0.5000 mg | ORAL_TABLET | Freq: Every day | ORAL | Status: DC

## 2014-04-28 NOTE — Progress Notes (Signed)
Patient ID: TABITHIA STRODER, female   DOB: 1956/08/03, 58 y.o.   MRN: [id number] Patient ID: MAKENZYE TROUTMAN, female   DOB: 1956-04-21, 58 y.o.   MRN: [id number] Patient ID: RASHEEN SCHEWE, female   DOB: 1956-01-05, 58 y.o.   MRN: [id number] Patient ID: DAYLEN HACK, female   DOB: October 16, 1955, 58 y.o.   MRN: [id number] Patient ID: MAKAYELA SECREST, female   DOB: 26-Sep-1955, 58 y.o.   MRN: [id number] [facility] [address] Progress Note LATESHIA SCHMOKER MRN: [id number] DOB: Nov 24, 1955 Age: 58 y.o.  Date: 04/28/2014 Start Time: 10:20 AM End Time: 10:45 AM  Chief Complaint: Chief Complaint  Patient presents with  . Anxiety  . Depression  . Follow-up   Subjective: "Having trouble getting to sleep  This patient is a 58 year old married white female who lives in [place] with her husband they have no children. 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  The patient states she's had a history of mental illness dating back to age 23. At that time she was hospitalized for severe depression with suicidal ideation. She's had approximately 5 more hospitalizations since then. At times she's been extremely depressed and had been cutting herself 4 years ago prior to her last hospitalization. She's had bad reactions to Haldol and Depakote but is currently doing well on her current regimen.  She's sleeping well, her energy is good and her mood is good. She is enjoying all for activities. She's had no thoughts whatsoever of self-harm. She's not had any side effects from Zyprexa. Her primary doctors following her labs and her blood sugar has been normal  The patient returns after 3 months. Last time she had a broken left wrist but this has healed up well. She is now back to all of her church activities as well as volunteering. Her mood is been excellent she is not at all depressed but sometimes can't fall asleep at night. I told her we could  increase her Zyprexa little bit and this might help and she is agreeable Zyprexa 2.5 mg at bedtime  Prozac 20 mg daily Cogentin 0.5 mg at bedtime    Past psychiatric history Patient has significant history of psychiatric illness. She has at least 6 psychiatric admission with 2 suicidal attempt. She had history of noncompliant with medication resulting in decompensation. She has history of mania and severe depression. In the past she had tried Seroquel, Cymbalta, lithium, Elavil, Depakote and Haldol. She has been stable on a small dose of olanzapine Prozac and Cogentin.  Allergies: Allergies  Allergen Reactions  . Depakote [Divalproex Sodium] Other (See Comments)    Tremors so bad that she scalded herself twice with coffee.  . Lithium Nausea And Vomiting  . Reglan [Metoclopramide] Other (See Comments)    Mouth dystonia/distortion  . Codeine Nausea And Vomiting  . Latex    Medical History: Past Medical History  Diagnosis Date  . GERD (gastroesophageal reflux disease)   . Obesity   . History of UTI   . Chronic kidney disease   . Bipolar disorder   . Depression   Patient see Dr. Manuella Ghazi in [place]. Patient is obese with a history of GERD, UTI, and arthritis. She had shoulder surgery and recently hernia repair.  She is expected to have her left kidney removed next week.  Surgical History: Past Surgical History  Procedure Laterality Date  . Shoulder surgery    . Appendectomy    .  Abdominal hysterectomy    . Hernia repair    . Left kidney and ureter removal  08/18/2012   Family History: family history includes Alcohol abuse in her brother and father; Anxiety disorder in her brother; Bipolar disorder in her maternal aunt, maternal grandmother, and mother; Dementia in her father; Depression in her brother, father, and sister; Drug abuse in her brother; Seizures in her sister. There is no history of ADD / ADHD, OCD, Paranoid behavior, Schizophrenia, Sexual abuse, or Physical abuse. Patient  has multiple family member who has psychiatric illness. Her brother has been a patient in this office however now he sees [facility].  She also has a family history of diabetes.  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 She denies any illegal drug use.  Psychosocial history Patient lives with her husband. She is currently not working. She has no children. She has good support system through her family. She is working as Psychologist, occupational at [employer].  Vitals: BP 105/86  Pulse 98  Ht 5\' 6"  (1.676 m)  Wt 206 lb 12.8 oz (93.804 kg)  BMI 33.39 kg/m2 Lost 2 pounds since last visit.  Mental status examination Patient is well-groomed and well-dressed.She is cooperative and pleasant and maintained good eye contact. Her speech is soft and clear.  Her thought process is logical and goal-directed.  She described her mood is good and her affect is mood congruent. She is a bit more anxious particularly about walking in her house  She denies any active or passive suicidal thinking and homicidal thinking. She denies any auditory or visual hallucination. She denies any paranoid delusions or thinking. Her attention and concentration is fair. Her thought process is logical linear and goal-directed. She's alert and oriented x3. Her insight judgment and impulse control is okay.  Lab Results:  No results found for this or any previous visit (from the past 8736 hour(s)).   Assessment Axis I Bipolar disorder NOS Axis II deferred Axis III see medical history Axis IV mild to moderate Axis V 80  Plan: I took her vitals.  I reviewed CC, tobacco/med/surg Hx, meds effects/ side effects, problem list, therapies and responses as well as current situation/symptoms discussed options. Continue current effective medications but increase Zyprexa to 5 mg each bedtime Return to clinic in 3 months but call if she still can't  sleep See orders and pt instructions for more details. MEDICATIONS this encounter: Meds ordered this encounter  Medications  . FLUoxetine (PROZAC) 20 MG capsule    Sig: Take 1 capsule (20 mg total) by mouth daily.    Dispense:  30 capsule    Refill:  2  . benztropine (COGENTIN) 0.5 MG tablet    Sig: Take 1 tablet (0.5 mg total) by mouth daily.    Dispense:  30 tablet    Refill:  2  . OLANZapine (ZYPREXA) 5 MG tablet    Sig: Take 1 tablet (5 mg total) by mouth at bedtime.    Dispense:  30 tablet    Refill:  2   Medical Decision Making Problem Points:  Established problem, stable/improving (1), Review of last therapy session (1) and Review of psycho-social stressors (1) Data Points:  Review or order clinical lab tests (1) Review of medication regiment & side effects (2) and Review of other somatic treatments recommended (1)  I certify that outpatient services furnished can reasonably be expected to  improve the patient's condition.   Levonne Spiller, MD

## 2014-07-28 ENCOUNTER — Ambulatory Visit (INDEPENDENT_AMBULATORY_CARE_PROVIDER_SITE_OTHER): Payer: BC Managed Care – PPO | Admitting: Psychiatry

## 2014-07-28 ENCOUNTER — Encounter (HOSPITAL_COMMUNITY): Payer: Self-pay | Admitting: Psychiatry

## 2014-07-28 VITALS — BP 110/67 | HR 75 | Ht 66.0 in | Wt 217.0 lb

## 2014-07-28 DIAGNOSIS — F319 Bipolar disorder, unspecified: Secondary | ICD-10-CM

## 2014-07-28 MED ORDER — FLUOXETINE HCL 20 MG PO CAPS: 20.0000 mg | ORAL_CAPSULE | Freq: Every day | ORAL | Status: DC

## 2014-07-28 MED ORDER — BENZTROPINE MESYLATE 0.5 MG PO TABS: 0.5000 mg | ORAL_TABLET | Freq: Every day | ORAL | Status: DC

## 2014-07-28 MED ORDER — OLANZAPINE 5 MG PO TABS: 5.0000 mg | ORAL_TABLET | Freq: Every day | ORAL | Status: DC

## 2014-07-28 NOTE — Progress Notes (Signed)
Patient ID: Amanda Lara, female   DOB: 01-31-1956, 58 y.o.   MRN: [id number] Patient ID: Amanda Lara, female   DOB: [birth year], 58 y.o.   MRN: [id number] Patient ID: Amanda Lara, female   DOB: Dec 03, 1955, 58 y.o.   MRN: [id number] Patient ID: Amanda Lara, female   DOB: Nov 02, 1955, 58 y.o.   MRN: [id number] Patient ID: Amanda Lara, female   DOB: [birth year], 58 y.o.   MRN: [id number] Patient ID: Amanda Lara, female   DOB: 1956-04-18, 58 y.o.   MRN: [id number] [facility] [address] Progress Note Amanda Lara MRN: [id number] DOB: 11/09/1955 Age: 58 y.o.  Date: 07/28/2014 Start Time: 10:20 AM End Time: 10:45 AM  Chief Complaint: Chief Complaint  Patient presents with  . Depression  . Anxiety  . Follow-up   Subjective: "Having trouble getting to sleep  This patient is a 58 year old married white female who lives in [place] with her husband they have no children. 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  The patient states she's had a history of mental illness dating back to age 58. At that time she was hospitalized for severe depression with suicidal ideation. She's had approximately 5 more hospitalizations since then. At times she's been extremely depressed and had been cutting herself 4 years ago prior to her last hospitalization. She's had bad reactions to Haldol and Depakote but is currently doing well on her current regimen.  She's sleeping well, her energy is good and her mood is good. She is enjoying all for activities. She's had no thoughts whatsoever of self-harm. She's not had any side effects from Zyprexa. Her primary doctors following her labs and her blood sugar has been normal  The patient returns after 3 months. Last time she was not sleeping well and I increased her Zyprexa to 5 mg at bedtime. She is sleeping well now. Her mood is excellent and she spending a lot of her time volunteering  for food pantry. She denies any other symptoms or side effects Zyprexa 5 mg at bedtime  Prozac 20 mg daily Cogentin 0.5 mg at bedtime    Past psychiatric history Patient has significant history of psychiatric illness. She has at least 6 psychiatric admission with 2 suicidal attempt. She had history of noncompliant with medication resulting in decompensation. She has history of mania and severe depression. In the past she had tried Seroquel, Cymbalta, lithium, Elavil, Depakote and Haldol. She has been stable on a small dose of olanzapine Prozac and Cogentin.  Allergies: Allergies  Allergen Reactions  . Depakote [Divalproex Sodium] Other (See Comments)    Tremors so bad that she scalded herself twice with coffee.  . Lithium Nausea And Vomiting  . Reglan [Metoclopramide] Other (See Comments)    Mouth dystonia/distortion  . Codeine Nausea And Vomiting  . Latex    Medical History: Past Medical History  Diagnosis Date  . GERD (gastroesophageal reflux disease)   . Obesity   . History of UTI   . Chronic kidney disease   . Bipolar disorder   . Depression   Patient see Dr. Manuella Ghazi in [place]. Patient is obese with a history of GERD, UTI, and arthritis. She had shoulder surgery and recently hernia repair.  She is expected to have her left kidney removed next week.  Surgical History: Past Surgical History  Procedure Laterality Date  . Shoulder surgery    . Appendectomy    .  Abdominal hysterectomy    . Hernia repair    . Left kidney and ureter removal  08/18/2012   Family History: family history includes Alcohol abuse in her brother and father; Anxiety disorder in her brother; Bipolar disorder in her maternal aunt, maternal grandmother, and mother; Dementia in her father; Depression in her brother, father, and sister; Drug abuse in her brother; Seizures in her sister. There is no history of ADD / ADHD, OCD, Paranoid behavior, Schizophrenia, Sexual abuse, or Physical abuse. Patient has multiple  family member who has psychiatric illness. Her brother has been a patient in this office however now [facility]  She also has a family history of diabetes.  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 She denies any illegal drug use.  Psychosocial history Patient lives with her husband. She is currently not working. She has no children. She has good support system through her family. She is working as Psychologist, occupational at [employer].  Vitals: BP 110/67 mmHg  Pulse 75  Ht 5\' 6"  (1.676 m)  Wt 217 lb (98.431 kg)  BMI 35.04 kg/m2 Lost 2 pounds since last visit.  Mental status examination Patient is well-groomed and well-dressed.She is cooperative and pleasant and maintained good eye contact. Her speech is soft and clear.  Her thought process is logical and goal-directed.  She described her mood is good and her affect is mood congruent. She is a bit more anxious particularly about walking in her house  She denies any active or passive suicidal thinking and homicidal thinking. She denies any auditory or visual hallucination. She denies any paranoid delusions or thinking. Her attention and concentration is fair. Her thought process is logical linear and goal-directed. She's alert and oriented x3. Her insight judgment and impulse control is okay.  Lab Results:  No results found for this or any previous visit (from the past 8736 hour(s)).   Assessment Axis I Bipolar disorder NOS Axis II deferred Axis III see medical history Axis IV mild to moderate Axis V 80  Plan: I took her vitals.  I reviewed CC, tobacco/med/surg Hx, meds effects/ side effects, problem list, therapies and responses as well as current situation/symptoms discussed options. Continue current effective medications  Return to clinic in 4 months See orders and pt instructions for more details. MEDICATIONS this encounter: Meds  ordered this encounter  Medications  . FLUoxetine (PROZAC) 20 MG capsule    Sig: Take 1 capsule (20 mg total) by mouth daily.    Dispense:  30 capsule    Refill:  3  . benztropine (COGENTIN) 0.5 MG tablet    Sig: Take 1 tablet (0.5 mg total) by mouth daily.    Dispense:  30 tablet    Refill:  3  . OLANZapine (ZYPREXA) 5 MG tablet    Sig: Take 1 tablet (5 mg total) by mouth at bedtime.    Dispense:  30 tablet    Refill:  3   Medical Decision Making Problem Points:  Established problem, stable/improving (1), Review of last therapy session (1) and Review of psycho-social stressors (1) Data Points:  Review or order clinical lab tests (1) Review of medication regiment & side effects (2) and Review of other somatic treatments recommended (1)  I certify that outpatient services furnished can reasonably be expected to improve the patient's condition.   Levonne Spiller, MD

## 2014-11-29 ENCOUNTER — Ambulatory Visit (HOSPITAL_COMMUNITY): Payer: Self-pay | Admitting: Psychiatry

## 2014-12-01 ENCOUNTER — Ambulatory Visit (INDEPENDENT_AMBULATORY_CARE_PROVIDER_SITE_OTHER): Payer: 59 | Admitting: Psychiatry

## 2014-12-01 ENCOUNTER — Encounter (HOSPITAL_COMMUNITY): Payer: Self-pay | Admitting: Psychiatry

## 2014-12-01 VITALS — BP 140/60 | HR 74 | Ht 66.0 in | Wt 218.4 lb

## 2014-12-01 DIAGNOSIS — F319 Bipolar disorder, unspecified: Secondary | ICD-10-CM

## 2014-12-01 MED ORDER — BENZTROPINE MESYLATE 0.5 MG PO TABS: 0.5000 mg | ORAL_TABLET | Freq: Every day | ORAL | Status: DC

## 2014-12-01 MED ORDER — OLANZAPINE 5 MG PO TABS: 5.0000 mg | ORAL_TABLET | Freq: Every day | ORAL | Status: DC

## 2014-12-01 MED ORDER — FLUOXETINE HCL 20 MG PO CAPS: 20.0000 mg | ORAL_CAPSULE | Freq: Every day | ORAL | Status: DC

## 2014-12-01 NOTE — Progress Notes (Signed)
Patient ID: MEOSHIA BILLING, female   DOB: 31-Mar-1956, 59 y.o.   MRN: [id number] Patient ID: OCEANE FOSSE, female   DOB: 07-Jul-1956, 59 y.o.   MRN: [id number] Patient ID: PETREA FREDENBURG, female   DOB: 06-12-1956, 59 y.o.   MRN: [id number] Patient ID: BENTLEY HARALSON, female   DOB: 19-Nov-1955, 59 y.o.   MRN: [id number] Patient ID: SIDNEE GAMBRILL, female   DOB: Nov 10, 1955, 59 y.o.   MRN: [id number] Patient ID: ELLANIE OPPEDISANO, female   DOB: 1956/06/11, 59 y.o.   MRN: [id number] Patient ID: ADDILYNNE OLHEISER, female   DOB: 31-Jan-1956, 59 y.o.   MRN: [id number] [facility] [address] Progress Note GWENDOLA HORNADAY MRN: [id number] DOB: [birth year] Age: 59 y.o.  Date: 12/01/2014 Start Time: 10:20 AM End Time: 10:45 AM  Chief Complaint: Chief Complaint  Patient presents with  . Depression  . Manic Behavior  . Follow-up   Subjective: I'm doing well  This patient is a 59 year old married white female who lives in [place] with her husband they have no children. 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  The patient states she's had a history of mental illness dating back to age 18. At that time she was hospitalized for severe depression with suicidal ideation. She's had approximately 5 more hospitalizations since then. At times she's been extremely depressed and had been cutting herself 4 years ago prior to her last hospitalization. She's had bad reactions to Haldol and Depakote but is currently doing well on her current regimen.  She's sleeping well, her energy is good and her mood is good. She is enjoying all for activities. She's had no thoughts whatsoever of self-harm. She's not had any side effects from Zyprexa. Her primary doctors following her labs and her blood sugar has been normal  The patient returns after 4 months. She's doing extremely well. She sleeping well on the increased dose of Zyprexa. Her mood is good and she is  volunteering for the food pantry, cleaning houses babysitting and going to church. She stays busy but does not feel like she is manic or having any kind of psychotic symptoms. Zyprexa 5 mg at bedtime  Prozac 20 mg daily Cogentin 0.5 mg at bedtime    Past psychiatric history Patient has significant history of psychiatric illness. She has at least 6 psychiatric admission with 2 suicidal attempt. She had history of noncompliant with medication resulting in decompensation. She has history of mania and severe depression. In the past she had tried Seroquel, Cymbalta, lithium, Elavil, Depakote and Haldol. She has been stable on a small dose of olanzapine Prozac and Cogentin.  Allergies: Allergies  Allergen Reactions  . Depakote [Divalproex Sodium] Other (See Comments)    Tremors so bad that she scalded herself twice with coffee.  . Lithium Nausea And Vomiting  . Reglan [Metoclopramide] Other (See Comments)    Mouth dystonia/distortion  . Codeine Nausea And Vomiting  . Latex    Medical History: Past Medical History  Diagnosis Date  . GERD (gastroesophageal reflux disease)   . Obesity   . History of UTI   . Chronic kidney disease   . Bipolar disorder   . Depression   Patient see Dr. Manuella Ghazi in [place]. Patient is obese with a history of GERD, UTI, and arthritis. She had shoulder surgery and recently hernia repair.  She is expected to have her left kidney removed next week.  Surgical History:  Past Surgical History  Procedure Laterality Date  . Shoulder surgery    . Appendectomy    . Abdominal hysterectomy    . Hernia repair    . Left kidney and ureter removal  08/18/2012   Family History: family history includes Alcohol abuse in her brother and father; Anxiety disorder in her brother; Bipolar disorder in her maternal aunt, maternal grandmother, and mother; Dementia in her father; Depression in her brother, father, and sister; Drug abuse in her brother; Seizures in her sister. There is no  history of ADD / ADHD, OCD, Paranoid behavior, Schizophrenia, Sexual abuse, or Physical abuse. Patient has multiple family member who has psychiatric illness. Her brother has been a patient in this office however now he sees [facility]  She also has a family history of diabetes.  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 She denies any illegal drug use.  Psychosocial history Patient lives with her husband. She is currently not working. She has no children. She has good support system through her family. She is working as Psychologist, occupational at [employer].  Vitals: BP 140/60 mmHg  Pulse 74  Ht 5\' 6"  (1.676 m)  Wt 218 lb 6.4 oz (99.066 kg)  BMI 35.27 kg/m2 Lost 2 pounds since last visit.  Mental status examination Patient is well-groomed and well-dressed.She is cooperative and pleasant and maintained good eye contact. Her speech is soft and clear.  Her thought process is logical and goal-directed.  She described her mood is good and her affect is mood congruent  She denies any active or passive suicidal thinking and homicidal thinking. She denies any auditory or visual hallucination. She denies any paranoid delusions or thinking. Her attention and concentration is fair. Her thought process is logical linear and goal-directed. She's alert and oriented x3. Her insight judgment and impulse control is okay.  Lab Results:  No results found for this or any previous visit (from the past 8736 hour(s)).   Assessment Axis I Bipolar disorder NOS Axis II deferred Axis III see medical history Axis IV mild to moderate Axis V 80  Plan: I took her vitals.  I reviewed CC, tobacco/med/surg Hx, meds effects/ side effects, problem list, therapies and responses as well as current situation/symptoms discussed options. Continue current effective medications  Return to clinic in 4 months See orders and pt  instructions for more details. MEDICATIONS this encounter: Meds ordered this encounter  Medications  . FLUoxetine (PROZAC) 20 MG capsule    Sig: Take 1 capsule (20 mg total) by mouth daily.    Dispense:  30 capsule    Refill:  3  . benztropine (COGENTIN) 0.5 MG tablet    Sig: Take 1 tablet (0.5 mg total) by mouth daily.    Dispense:  30 tablet    Refill:  3  . OLANZapine (ZYPREXA) 5 MG tablet    Sig: Take 1 tablet (5 mg total) by mouth at bedtime.    Dispense:  30 tablet    Refill:  3   Medical Decision Making Problem Points:  Established problem, stable/improving (1), Review of last therapy session (1) and Review of psycho-social stressors (1) Data Points:  Review or order clinical lab tests (1) Review of medication regiment & side effects (2) and Review of other somatic treatments recommended (1)  I certify that outpatient services furnished can reasonably be expected to improve the patient's condition.  Levonne Spiller, MD

## 2015-04-01 ENCOUNTER — Ambulatory Visit (INDEPENDENT_AMBULATORY_CARE_PROVIDER_SITE_OTHER): Payer: 59 | Admitting: Psychiatry

## 2015-04-01 ENCOUNTER — Encounter (HOSPITAL_COMMUNITY): Payer: Self-pay | Admitting: Psychiatry

## 2015-04-01 DIAGNOSIS — F319 Bipolar disorder, unspecified: Secondary | ICD-10-CM | POA: Diagnosis not present

## 2015-04-01 MED ORDER — OLANZAPINE 5 MG PO TABS: 5.0000 mg | ORAL_TABLET | Freq: Every day | ORAL | Status: DC

## 2015-04-01 MED ORDER — BENZTROPINE MESYLATE 0.5 MG PO TABS: 0.5000 mg | ORAL_TABLET | Freq: Every day | ORAL | Status: DC

## 2015-04-01 MED ORDER — FLUOXETINE HCL 20 MG PO CAPS: 20.0000 mg | ORAL_CAPSULE | Freq: Every day | ORAL | Status: DC

## 2015-04-01 NOTE — Progress Notes (Signed)
Patient ID: Amanda Lara, female   DOB: [birth year], 59 y.o.   MRN: [id number] Patient ID: Amanda Lara, female   DOB: 1955/12/30, 59 y.o.   MRN: [id number] Patient ID: Amanda Lara, female   DOB: Jun 13, 1956, 59 y.o.   MRN: [id number] Patient ID: Amanda Lara, female   DOB: 14-Nov-1955, 59 y.o.   MRN: [id number] Patient ID: Amanda Lara, female   DOB: 09/14/1955, 59 y.o.   MRN: [id number] Patient ID: Amanda Lara, female   DOB: Feb 07, 1956, 59 y.o.   MRN: [id number] Patient ID: Amanda Lara, female   DOB: 13-Dec-1955, 59 y.o.   MRN: [id number] Patient ID: Amanda Lara, female   DOB: Jan 15, 1956, 59 y.o.   MRN: [id number] [facility] [address] Progress Note Amanda Lara MRN: [id number] DOB: 1956/05/03 Age: 59 y.o.  Date: 04/01/2015 Start Time: 10:20 AM End Time: 10:45 AM  Chief Complaint: Chief Complaint  Patient presents with  . Depression  . Anxiety  . Follow-up   Subjective: I'm doing well  This patient is a 59 year old married white female who lives in [place] with her husband they have no children. 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  The patient states she's had a history of mental illness dating back to age 22. At that time she was hospitalized for severe depression with suicidal ideation. She's had approximately 5 more hospitalizations since then. At times she's been extremely depressed and had been cutting herself 4 years ago prior to her last hospitalization. She's had bad reactions to Haldol and Depakote but is currently doing well on her current regimen.  She's sleeping well, her energy is good and her mood is good. She is enjoying all for activities. She's had no thoughts whatsoever of self-harm. She's not had any side effects from Zyprexa. Her primary doctors following her labs and her blood sugar has been normal  The patient returns after 4 months. She's doing extremely well. She sleeping  well on the increased dose of Zyprexa. Her mood is good and she is volunteering for the food pantry, cleaning houses babysitting and going to church. She stays busy but does not feel like she is manic or having any kind of psychotic symptoms. She denies any side effects such as twitching or jerking Zyprexa 5 mg at bedtime  Prozac 20 mg daily Cogentin 0.5 mg at bedtime    Past psychiatric history Patient has significant history of psychiatric illness. She has at least 6 psychiatric admission with 2 suicidal attempt. She had history of noncompliant with medication resulting in decompensation. She has history of mania and severe depression. In the past she had tried Seroquel, Cymbalta, lithium, Elavil, Depakote and Haldol. She has been stable on a small dose of olanzapine Prozac and Cogentin.  Allergies: Allergies  Allergen Reactions  . Depakote [Divalproex Sodium] Other (See Comments)    Tremors so bad that she scalded herself twice with coffee.  . Lithium Nausea And Vomiting  . Reglan [Metoclopramide] Other (See Comments)    Mouth dystonia/distortion  . Codeine Nausea And Vomiting  . Latex    Medical History: Past Medical History  Diagnosis Date  . GERD (gastroesophageal reflux disease)   . Obesity   . History of UTI   . Chronic kidney disease   . Bipolar disorder   . Depression   Patient see Dr. Manuella Ghazi in [place]. Patient is obese with a history of GERD, UTI,  and arthritis. She had shoulder surgery and recently hernia repair.  She is expected to have her left kidney removed next week.  Surgical History: Past Surgical History  Procedure Laterality Date  . Shoulder surgery    . Appendectomy    . Abdominal hysterectomy    . Hernia repair    . Left kidney and ureter removal  08/18/2012   Family History: family history includes Alcohol abuse in her brother and father; Anxiety disorder in her brother; Bipolar disorder in her maternal aunt, maternal grandmother, and mother; Dementia in  her father; Depression in her brother, father, and sister; Drug abuse in her brother; Seizures in her sister. There is no history of ADD / ADHD, OCD, Paranoid behavior, Schizophrenia, Sexual abuse, or Physical abuse. Patient has multiple family member who has psychiatric illness. Her brother has been a patient in this office however now he sees [facility]  She also has a family history of diabetes.  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 She denies any illegal drug use.  Psychosocial history Patient lives with her husband. She is currently not working. She has no children. She has good support system through her family. She is working as Psychologist, occupational at [employer].  Vitals: There were no vitals taken for this visit. Lost 2 pounds since last visit.  Mental status examination Patient is well-groomed and well-dressed.She is cooperative and pleasant and maintained good eye contact. Her speech is soft and clear.  Her thought process is logical and goal-directed.  She described her mood is good and her affect is mood congruent  She denies any active or passive suicidal thinking and homicidal thinking. She denies any auditory or visual hallucination. She denies any paranoid delusions or thinking. Her attention and concentration is fair. Her thought process is logical linear and goal-directed. She's alert and oriented x3. Her insight judgment and impulse control is okay.  Lab Results:  No results found for this or any previous visit (from the past 8736 hour(s)).   Assessment Axis I Bipolar disorder NOS Axis II deferred Axis III see medical history Axis IV mild to moderate Axis V 80  Plan: I took her vitals.  I reviewed CC, tobacco/med/surg Hx, meds effects/ side effects, problem list, therapies and responses as well as current situation/symptoms discussed options. Continue Prozac for  depression, Zyprexa for mood stabilization and Cogentin to prevent side effects from Zyprexa Return to clinic in 4 months See orders and pt instructions for more details. MEDICATIONS this encounter: Meds ordered this encounter  Medications  . benztropine (COGENTIN) 0.5 MG tablet    Sig: Take 1 tablet (0.5 mg total) by mouth daily.    Dispense:  30 tablet    Refill:  3  . FLUoxetine (PROZAC) 20 MG capsule    Sig: Take 1 capsule (20 mg total) by mouth daily.    Dispense:  30 capsule    Refill:  3  . OLANZapine (ZYPREXA) 5 MG tablet    Sig: Take 1 tablet (5 mg total) by mouth at bedtime.    Dispense:  30 tablet    Refill:  3   Medical Decision Making Problem Points:  Established problem, stable/improving (1), Review of last therapy session (1) and Review of psycho-social stressors (1) Data Points:  Review or order clinical lab tests (1) Review of medication regiment & side effects (2) and Review of other somatic  treatments recommended (1)  I certify that outpatient services furnished can reasonably be expected to improve the patient's condition.   Levonne Spiller, MD

## 2015-06-09 ENCOUNTER — Telehealth (HOSPITAL_COMMUNITY): Payer: Self-pay | Admitting: *Deleted

## 2015-07-29 ENCOUNTER — Ambulatory Visit (HOSPITAL_COMMUNITY): Payer: Self-pay | Admitting: Psychiatry

## 2015-08-05 ENCOUNTER — Encounter (HOSPITAL_COMMUNITY): Payer: Self-pay | Admitting: Psychiatry

## 2015-08-05 ENCOUNTER — Ambulatory Visit (INDEPENDENT_AMBULATORY_CARE_PROVIDER_SITE_OTHER): Payer: 59 | Admitting: Psychiatry

## 2015-08-05 VITALS — BP 120/82 | Ht 66.0 in | Wt 211.0 lb

## 2015-08-05 DIAGNOSIS — F319 Bipolar disorder, unspecified: Secondary | ICD-10-CM

## 2015-08-05 MED ORDER — FLUOXETINE HCL 20 MG PO CAPS: 20.0000 mg | ORAL_CAPSULE | Freq: Every day | ORAL | Status: DC

## 2015-08-05 MED ORDER — OLANZAPINE 5 MG PO TABS: 5.0000 mg | ORAL_TABLET | Freq: Every day | ORAL | Status: DC

## 2015-08-05 MED ORDER — BENZTROPINE MESYLATE 0.5 MG PO TABS: 0.5000 mg | ORAL_TABLET | Freq: Every day | ORAL | Status: DC

## 2015-08-05 NOTE — Progress Notes (Signed)
Patient ID: [name], female   DOB: 11-05-1955, 59 y.o.   MRN: [id number] Patient ID: [name], female   DOB: May 05, 1956, 59 y.o.   MRN: [id number] Patient ID: [name], female   DOB: June 19, 1956, 59 y.o.   MRN: [id number] Patient ID: [name], female   DOB: July 23, 1956, 59 y.o.   MRN: [id number] Patient ID: [name], female   DOB: 1956-08-08, 59 y.o.   MRN: [id number] Patient ID: [name], female   DOB: [birth year], 59 y.o.   MRN: [id number] Patient ID: [name], female   DOB: 24-Mar-1956, 59 y.o.   MRN: [id number] Patient ID: [name], female   DOB: 1956/05/09, 59 y.o.   MRN: [id number] Patient ID: [name], female   DOB: 1956-01-27, 59 y.o.   MRN: [id number] [facility] [id number] Progress Note Amanda Lara MRN: [id number] DOB: 1956/01/01 Age: 59 y.o.  Date: 08/05/2015 Start Time: 10:20 AM End Time: 10:45 AM  Chief Complaint: Chief Complaint  Patient presents with  . Depression  . Anxiety  . Follow-up   Subjective: I'm doing ok  This patient is a 59 year old married white female who lives in [place] with her husband they have no children. 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  The patient states she's had a history of mental illness dating back to age 64. At that time she was hospitalized for severe depression with suicidal ideation. She's had approximately 5 more hospitalizations since then. At times she's been extremely depressed and had been cutting herself 4 years ago prior to her last hospitalization. She's had bad reactions to Haldol and Depakote but is currently doing well on her current regimen.  She's sleeping well, her energy is good and her mood is good. She is enjoying all for activities. She's had no thoughts whatsoever of self-harm. She's not had any side effects from Zyprexa. Her primary doctors following her labs and her blood sugar has been  normal  The patient returns after 4 months. She's doing well but has been under a fair amount of stress. Her sister died in May 12, 2023 of a stroke. October her husband fell off a ladder and broke several vertebrae in his back necessitating emergency surgery. He is recovering slowly but surely. The patient has done virtually nothing but help take care of him. He is starting to get better and is walking again. Her mood has held fairly well throughout all this and she continues to sleep well Zyprexa 5 mg at bedtime  Prozac 20 mg daily Cogentin 0.5 mg at bedtime    Past psychiatric history Patient has significant history of psychiatric illness. She has at least 6 psychiatric admission with 2 suicidal attempt. She had history of noncompliant with medication resulting in decompensation. She has history of mania and severe depression. In the past she had tried Seroquel, Cymbalta, lithium, Elavil, Depakote and Haldol. She has been stable on a small dose of olanzapine Prozac and Cogentin.  Allergies: Allergies  Allergen Reactions  . Depakote [Divalproex Sodium] Other (See Comments)    Tremors so bad that she scalded herself twice with coffee.  . Lithium Nausea And Vomiting  . Reglan [Metoclopramide] Other (See Comments)    Mouth dystonia/distortion  . Codeine Nausea And Vomiting  . Latex    Medical History: Past Medical History  Diagnosis Date  . GERD (gastroesophageal reflux disease)   . Obesity   .  History of UTI   . Chronic kidney disease   . Bipolar disorder ([place])   . Depression   Patient see Dr. Manuella Ghazi in [place]. Patient is obese with a history of GERD, UTI, and arthritis. She had shoulder surgery and recently hernia repair.  She is expected to have her left kidney removed next week.  Surgical History: Past Surgical History  Procedure Laterality Date  . Shoulder surgery    . Appendectomy    . Abdominal hysterectomy    . Hernia repair    . Left kidney and ureter removal  08/18/2012    Family History: family history includes Alcohol abuse in her brother and father; Anxiety disorder in her brother; Bipolar disorder in her maternal aunt, maternal grandmother, and mother; Dementia in her father; Depression in her brother, father, and sister; Drug abuse in her brother; Seizures in her sister. There is no history of ADD / ADHD, OCD, Paranoid behavior, Schizophrenia, Sexual abuse, or Physical abuse. Patient has multiple family member who has psychiatric illness. Her brother has been a patient in this office however now he [facility]  She also has a family history of diabetes.  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 She denies any illegal drug use.  Psychosocial history Patient lives with her husband. She is currently not working. She has no children. She has good support system through her family. She is working as Psychologist, occupational at [employer].  Vitals: BP 120/82 mmHg  Ht 5\' 6"  (1.676 m)  Wt 211 lb (95.709 kg)  BMI 34.07 kg/m2 Lost 2 pounds since last visit.  Mental status examination Patient is well-groomed and well-dressed.She is cooperative and pleasant and maintained good eye contact. Her speech is soft and clear.  Her thought process is logical and goal-directed.  She described her mood is good and her affect is mood congruent  She denies any active or passive suicidal thinking and homicidal thinking. She denies any auditory or visual hallucination. She denies any paranoid delusions or thinking. Her attention and concentration is fair. Her thought process is logical linear and goal-directed. She's alert and oriented x3. Her insight judgment and impulse control is okay.  Lab Results:  No results found for this or any previous visit (from the past 8736 hour(s)).   Assessment Axis I Bipolar disorder NOS Axis II deferred Axis III see medical history Axis IV mild to  moderate Axis V 80  Plan: I took her vitals.  I reviewed CC, tobacco/med/surg Hx, meds effects/ side effects, problem list, therapies and responses as well as current situation/symptoms discussed options. Continue Prozac for depression, Zyprexa for mood stabilization and Cogentin to prevent side effects from Zyprexa Return to clinic in 3 months See orders and pt instructions for more details. MEDICATIONS this encounter: Meds ordered this encounter  Medications  . benztropine (COGENTIN) 0.5 MG tablet    Sig: Take 1 tablet (0.5 mg total) by mouth daily.    Dispense:  30 tablet    Refill:  3  . FLUoxetine (PROZAC) 20 MG capsule    Sig: Take 1 capsule (20 mg total) by mouth daily.    Dispense:  30 capsule    Refill:  3  . OLANZapine (ZYPREXA) 5 MG tablet    Sig: Take 1 tablet (5 mg total) by mouth at bedtime.    Dispense:  30 tablet    Refill:  3  Medical Decision Making Problem Points:  Established problem, stable/improving (1), Review of last therapy session (1) and Review of psycho-social stressors (1) Data Points:  Review or order clinical lab tests (1) Review of medication regiment & side effects (2) and Review of other somatic treatments recommended (1)  I certify that outpatient services furnished can reasonably be expected to improve the patient's condition.   Levonne Spiller, MD

## 2015-11-03 ENCOUNTER — Ambulatory Visit (INDEPENDENT_AMBULATORY_CARE_PROVIDER_SITE_OTHER): Payer: BLUE CROSS/BLUE SHIELD | Admitting: Psychiatry

## 2015-11-03 ENCOUNTER — Encounter (HOSPITAL_COMMUNITY): Payer: Self-pay | Admitting: Psychiatry

## 2015-11-03 VITALS — BP 114/80 | HR 85 | Ht 66.0 in | Wt 214.2 lb

## 2015-11-03 DIAGNOSIS — F319 Bipolar disorder, unspecified: Secondary | ICD-10-CM | POA: Diagnosis not present

## 2015-11-03 MED ORDER — OLANZAPINE 5 MG PO TABS: 5.0000 mg | ORAL_TABLET | Freq: Every day | ORAL | Status: DC

## 2015-11-03 MED ORDER — BENZTROPINE MESYLATE 0.5 MG PO TABS: 0.5000 mg | ORAL_TABLET | Freq: Every day | ORAL | Status: DC

## 2015-11-03 MED ORDER — FLUOXETINE HCL 20 MG PO CAPS: 20.0000 mg | ORAL_CAPSULE | Freq: Every day | ORAL | Status: DC

## 2015-11-03 NOTE — Progress Notes (Signed)
Patient ID: Amanda Lara, female   DOB: 02-16-1956, 60 y.o.   MRN: [id number] Patient ID: Amanda Lara, female   DOB: [birth year], 60 y.o.   MRN: [id number] Patient ID: Amanda Lara, female   DOB: 04-20-1956, 60 y.o.   MRN: [id number] Patient ID: Amanda Lara, female   DOB: 1955/10/05, 60 y.o.   MRN: [id number] Patient ID: Amanda Lara, female   DOB: 1956/06/30, 60 y.o.   MRN: [id number] Patient ID: Amanda Lara, female   DOB: [birth year], 60 y.o.   MRN: [id number] Patient ID: Amanda Lara, female   DOB: [birth year], 60 y.o.   MRN: [id number] Patient ID: Amanda Lara, female   DOB: 04/17/1956, 60 y.o.   MRN: [id number] Patient ID: Amanda Lara, female   DOB: [birth year], 60 y.o.   MRN: [id number] Patient ID: Amanda Lara, female   DOB: Nov 08, 1955, 60 y.o.   MRN: [id number] [facility] [id number] Progress Note Amanda Lara MRN: [id number] DOB: 1955/10/06 Age: 60 y.o.  Date: 11/03/2015 Start Time: 10:20 AM End Time: 10:45 AM  Chief Complaint: Chief Complaint  Patient presents with  . Depression  . Anxiety  . Follow-up   Subjective: I'm doing ok  This patient is a 60 year old married white female who lives in [place] with her husband they have no children. 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  The patient states she's had a history of mental illness dating back to age 60. At that time she was hospitalized for severe depression with suicidal ideation. She's had approximately 5 more hospitalizations since then. At times she's been extremely depressed and had been cutting herself 4 years ago prior to her last hospitalization. She's had bad reactions to Haldol and Depakote but is currently doing well on her current regimen.  She's sleeping well, her energy is good and her mood is good. She is enjoying all for activities. She's had no thoughts whatsoever of self-harm. She's not had any side effects from  Zyprexa. Her primary doctors following her labs and her blood sugar has been normal  The patient returns after 4 months. She's doing well . Her husband has recovered from his accident. Her mood is been stable and she's been sleeping well. Her energy is good and she denies any thoughts of self-harm. Zyprexa 5 mg at bedtime  Prozac 20 mg daily Cogentin 0.5 mg at bedtime    Past psychiatric history Patient has significant history of psychiatric illness. She has at least 6 psychiatric admission with 2 suicidal attempt. She had history of noncompliant with medication resulting in decompensation. She has history of mania and severe depression. In the past she had tried Seroquel, Cymbalta, lithium, Elavil, Depakote and Haldol. She has been stable on a small dose of olanzapine Prozac and Cogentin.  Allergies: Allergies  Allergen Reactions  . Depakote [Divalproex Sodium] Other (See Comments)    Tremors so bad that she scalded herself twice with coffee.  . Lithium Nausea And Vomiting  . Reglan [Metoclopramide] Other (See Comments)    Mouth dystonia/distortion  . Codeine Nausea And Vomiting  . Latex    Medical History: Past Medical History  Diagnosis Date  . GERD (gastroesophageal reflux disease)   . Obesity   . History of UTI   . Chronic kidney disease   . Bipolar disorder ([place])   . Depression   Patient see Dr. Manuella Ghazi in [place]. Patient is obese with  a history of GERD, UTI, and arthritis. She had shoulder surgery and recently hernia repair.  She is expected to have her left kidney removed next week.  Surgical History: Past Surgical History  Procedure Laterality Date  . Shoulder surgery    . Appendectomy    . Abdominal hysterectomy    . Hernia repair    . Left kidney and ureter removal  08/18/2012   Family History: family history includes Alcohol abuse in her brother and father; Anxiety disorder in her brother; Bipolar disorder in her maternal aunt, maternal grandmother, and mother;  Dementia in her father; Depression in her brother, father, and sister; Drug abuse in her brother; Seizures in her sister. There is no history of ADD / ADHD, OCD, Paranoid behavior, Schizophrenia, Sexual abuse, or Physical abuse. Patient has multiple family member who has psychiatric illness. Her brother has been a patient in this office however now he sees [facility].  She also has a family history of diabetes.  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 She denies any illegal drug use.  Psychosocial history Patient lives with her husband. She is currently not working. She has no children. She has good support system through her family. She is working as Psychologist, occupational at [employer].  Vitals: BP 114/80 mmHg  Pulse 85  Ht 5\' 6"  (1.676 m)  Wt 214 lb 3.2 oz (97.16 kg)  BMI 34.59 kg/m2  SpO2 93% Lost 2 pounds since last visit.  Mental status examination Patient is well-groomed and well-dressed.She is cooperative and pleasant and maintained good eye contact. Her speech is soft and clear.  Her thought process is logical and goal-directed.  She described her mood is good and her affect is mood congruent  She denies any active or passive suicidal thinking and homicidal thinking. She denies any auditory or visual hallucination. She denies any paranoid delusions or thinking. Her attention and concentration is fair. Her thought process is logical linear and goal-directed. She's alert and oriented x3. Her insight judgment and impulse control is okay.  Lab Results:  No results found for this or any previous visit (from the past 8736 hour(s)).   Assessment Axis I Bipolar disorder NOS Axis II deferred Axis III see medical history Axis IV mild to moderate Axis V 80  Plan: I took her vitals.  I reviewed CC, tobacco/med/surg Hx, meds effects/ side effects, problem list, therapies and responses as well as  current situation/symptoms discussed options. Continue Prozac for depression, Zyprexa for mood stabilization and Cogentin to prevent side effects from Zyprexa Return to clinic in 3 months See orders and pt instructions for more details. MEDICATIONS this encounter: Meds ordered this encounter  Medications  . benztropine (COGENTIN) 0.5 MG tablet    Sig: Take 1 tablet (0.5 mg total) by mouth daily.    Dispense:  30 tablet    Refill:  3  . FLUoxetine (PROZAC) 20 MG capsule    Sig: Take 1 capsule (20 mg total) by mouth daily.    Dispense:  30 capsule    Refill:  3  . OLANZapine (ZYPREXA) 5 MG tablet    Sig: Take 1 tablet (5 mg total) by mouth at bedtime.    Dispense:  30 tablet    Refill:  3   Medical Decision Making Problem Points:  Established problem, stable/improving (1), Review of last therapy session (1) and Review of psycho-social stressors (  1) Data Points:  Review or order clinical lab tests (1) Review of medication regiment & side effects (2) and Review of other somatic treatments recommended (1)  I certify that outpatient services furnished can reasonably be expected to improve the patient's condition.   Levonne Spiller, MD

## 2016-01-13 ENCOUNTER — Telehealth (HOSPITAL_COMMUNITY): Payer: Self-pay | Admitting: *Deleted

## 2016-01-13 NOTE — Telephone Encounter (Signed)
Called number on file and getting busy tone/unable to reach pt. Office need to resch appt for July 7th due to office being closed during scheduled appt. Will call back at another time.

## 2016-01-26 ENCOUNTER — Telehealth (HOSPITAL_COMMUNITY): Payer: Self-pay | Admitting: *Deleted

## 2016-03-02 ENCOUNTER — Ambulatory Visit (HOSPITAL_COMMUNITY): Payer: Self-pay | Admitting: Psychiatry

## 2016-03-12 ENCOUNTER — Ambulatory Visit (INDEPENDENT_AMBULATORY_CARE_PROVIDER_SITE_OTHER): Payer: BLUE CROSS/BLUE SHIELD | Admitting: Psychiatry

## 2016-03-12 ENCOUNTER — Encounter (HOSPITAL_COMMUNITY): Payer: Self-pay | Admitting: Psychiatry

## 2016-03-12 VITALS — BP 104/80 | HR 81 | Ht 66.0 in | Wt 205.6 lb

## 2016-03-12 DIAGNOSIS — F319 Bipolar disorder, unspecified: Secondary | ICD-10-CM | POA: Diagnosis not present

## 2016-03-12 MED ORDER — BENZTROPINE MESYLATE 0.5 MG PO TABS: 0.5000 mg | ORAL_TABLET | Freq: Every day | ORAL | Status: DC

## 2016-03-12 MED ORDER — OLANZAPINE 5 MG PO TABS: 5.0000 mg | ORAL_TABLET | Freq: Every day | ORAL | Status: DC

## 2016-03-12 MED ORDER — FLUOXETINE HCL 20 MG PO CAPS: 20.0000 mg | ORAL_CAPSULE | Freq: Every day | ORAL | Status: DC

## 2016-03-12 NOTE — Progress Notes (Signed)
Patient ID: Amanda Lara, female   DOB: [birth year], 60 y.o.   MRN: [id number] Patient ID: Amanda Lara, female   DOB: 1955-12-21, 60 y.o.   MRN: [id number] Patient ID: Amanda Lara, female   DOB: Aug 08, 1956, 60 y.o.   MRN: [id number] Patient ID: Amanda Lara, female   DOB: [birth year], 60 y.o.   MRN: [id number] Patient ID: Amanda Lara, female   DOB: 10-27-1955, 59 y.o.   MRN: [id number] Patient ID: Amanda Lara, female   DOB: November 18, 1955, 60 y.o.   MRN: [id number] Patient ID: Amanda Lara, female   DOB: 16-Jun-1956, 60 y.o.   MRN: [id number] Patient ID: Amanda Lara, female   DOB: [birth year], 60 y.o.   MRN: [id number] Patient ID: Amanda Lara, female   DOB: April 14, 1956, 60 y.o.   MRN: [id number] Patient ID: Amanda Lara, female   DOB: 1956-03-26, 60 y.o.   MRN: [id number] Patient ID: Amanda Lara, female   DOB: April 25, 1956, 59 y.o.   MRN: [id number] [facility] [id number] Progress Note Amanda Lara MRN: [id number] DOB: Feb 13, 1956 Age: 60 y.o.  Date: 03/12/2016 Start Time: 10:20 AM End Time: 10:45 AM  Chief Complaint: Chief Complaint  Patient presents with  . Depression  . Anxiety  . Follow-up   Subjective: I'm doing ok  This patient is a 60 year old married white female who lives in [place] with her husband they have no children. 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  The patient states she's had a history of mental illness dating back to age 32. At that time she was hospitalized for severe depression with suicidal ideation. She's had approximately 5 more hospitalizations since then. At times she's been extremely depressed and had been cutting herself 4 years ago prior to her last hospitalization. She's had bad reactions to Haldol and Depakote but is currently doing well on her current regimen.  She's sleeping well, her energy is good and her mood is good. She is enjoying all for activities. She's  had no thoughts whatsoever of self-harm. She's not had any side effects from Zyprexa. Her primary doctors following her labs and her blood sugar has been normal  The patient returns after 4 months. She's doing well . However she reports that 3 of her siblings have died in the last 3 months. This is been difficult for her but she tries to keep busy and enjoys taking care of her 7-year-old grandson. She denies either depressed or manic symptoms Zyprexa 5 mg at bedtime  Prozac 20 mg daily Cogentin 0.5 mg at bedtime    Past psychiatric history Patient has significant history of psychiatric illness. She has at least 6 psychiatric admission with 2 suicidal attempt. She had history of noncompliant with medication resulting in decompensation. She has history of mania and severe depression. In the past she had tried Seroquel, Cymbalta, lithium, Elavil, Depakote and Haldol. She has been stable on a small dose of olanzapine Prozac and Cogentin.  Allergies: Allergies  Allergen Reactions  . Depakote [Divalproex Sodium] Other (See Comments)    Tremors so bad that she scalded herself twice with coffee.  . Lithium Nausea And Vomiting  . Reglan [Metoclopramide] Other (See Comments)    Mouth dystonia/distortion  . Codeine Nausea And Vomiting  . Latex    Medical History: Past Medical History  Diagnosis Date  . GERD (gastroesophageal reflux disease)   . Obesity   . History  of UTI   . Chronic kidney disease   . Bipolar disorder ([place])   . Depression   Patient see Dr. Manuella Ghazi in [place]. Patient is obese with a history of GERD, UTI, and arthritis. She had shoulder surgery and recently hernia repair.  She is expected to have her left kidney removed next week.  Surgical History: Past Surgical History  Procedure Laterality Date  . Shoulder surgery    . Appendectomy    . Abdominal hysterectomy    . Hernia repair    . Left kidney and ureter removal  08/18/2012   Family History: family history includes  Alcohol abuse in her brother and father; Anxiety disorder in her brother; Bipolar disorder in her maternal aunt, maternal grandmother, and mother; Dementia in her father; Depression in her brother, father, and sister; Drug abuse in her brother; Seizures in her sister. There is no history of ADD / ADHD, OCD, Paranoid behavior, Schizophrenia, Sexual abuse, or Physical abuse. Patient has multiple family member who has psychiatric illness. Her brother has been a patient in this office however now he [facility].  She also has a family history of diabetes.  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 She denies any illegal drug use.  Psychosocial history Patient lives with her husband. She is currently not working. She has no children. She has good support system through her family. She is working as Psychologist, occupational at [employer].  Vitals: BP 104/80 mmHg  Pulse 81  Ht 5\' 6"  (1.676 m)  Wt 205 lb 9.6 oz (93.26 kg)  BMI 33.20 kg/m2  SpO2 96% Lost 2 pounds since last visit.  Mental status examination Patient is well-groomed and well-dressed.She is cooperative and pleasant and maintained good eye contact. Her speech is soft and clear.  Her thought process is logical and goal-directed.  She described her mood is good and her affect is mood congruent  She denies any active or passive suicidal thinking and homicidal thinking. She denies any auditory or visual hallucination. She denies any paranoid delusions or thinking. Her attention and concentration is fair. Her thought process is logical linear and goal-directed. She's alert and oriented x3. Her insight judgment and impulse control is okay.  Lab Results:  No results found for this or any previous visit (from the past 8736 hour(s)).   Assessment Axis I Bipolar disorder NOS Axis II deferred Axis III see medical history Axis IV mild to moderate Axis V  80  Plan: I took her vitals.  I reviewed CC, tobacco/med/surg Hx, meds effects/ side effects, problem list, therapies and responses as well as current situation/symptoms discussed options. Continue Prozac for depression, Zyprexa for mood stabilization and Cogentin to prevent side effects from Zyprexa Return to clinic in 3 months See orders and pt instructions for more details. MEDICATIONS this encounter: Meds ordered this encounter  Medications  . OLANZapine (ZYPREXA) 5 MG tablet    Sig: Take 1 tablet (5 mg total) by mouth at bedtime.    Dispense:  30 tablet    Refill:  3  . FLUoxetine (PROZAC) 20 MG capsule    Sig: Take 1 capsule (20 mg total) by mouth daily.    Dispense:  30 capsule    Refill:  3  . benztropine (COGENTIN) 0.5 MG tablet    Sig: Take 1 tablet (0.5 mg total) by mouth daily.    Dispense:  30  tablet    Refill:  3   Medical Decision Making Problem Points:  Established problem, stable/improving (1), Review of last therapy session (1) and Review of psycho-social stressors (1) Data Points:  Review or order clinical lab tests (1) Review of medication regiment & side effects (2) and Review of other somatic treatments recommended (1)  I certify that outpatient services furnished can reasonably be expected to improve the patient's condition.   Levonne Spiller, MD

## 2016-06-11 ENCOUNTER — Ambulatory Visit (INDEPENDENT_AMBULATORY_CARE_PROVIDER_SITE_OTHER): Payer: BLUE CROSS/BLUE SHIELD | Admitting: Psychiatry

## 2016-06-11 ENCOUNTER — Encounter (HOSPITAL_COMMUNITY): Payer: Self-pay | Admitting: Psychiatry

## 2016-06-11 VITALS — BP 116/61 | HR 62 | Ht 66.0 in | Wt 207.0 lb

## 2016-06-11 DIAGNOSIS — F319 Bipolar disorder, unspecified: Secondary | ICD-10-CM

## 2016-06-11 MED ORDER — BENZTROPINE MESYLATE 0.5 MG PO TABS: 0.5000 mg | ORAL_TABLET | Freq: Every day | ORAL | 3 refills | Status: DC

## 2016-06-11 MED ORDER — FLUOXETINE HCL 20 MG PO CAPS: 20.0000 mg | ORAL_CAPSULE | Freq: Every day | ORAL | 3 refills | Status: DC

## 2016-06-11 MED ORDER — OLANZAPINE 10 MG PO TABS: 10.0000 mg | ORAL_TABLET | Freq: Every day | ORAL | 2 refills | Status: DC

## 2016-06-11 NOTE — Progress Notes (Signed)
Patient ID: MARIETTA BUSHNELL, female   DOB: [birth year], 60 y.o.   MRN: [id number] Patient ID: JESSIECA ZEMAN, female   DOB: 1956-07-10, 60 y.o.   MRN: [id number] Patient ID: MAVOURNEEN RESENDE, female   DOB: 1956-08-11, 60 y.o.   MRN: [id number] Patient ID: TALISE SANDERS, female   DOB: 02/24/1956, 60 y.o.   MRN: [id number] Patient ID: RIYAH TEIG, female   DOB: 07/27/1956, 60 y.o.   MRN: [id number] Patient ID: ARRIAH KHACHATRYAN, female   DOB: 08/27/1956, 60 y.o.   MRN: [id number] Patient ID: ANDRAYA KIZER, female   DOB: January 28, 1956, 60 y.o.   MRN: [id number] Patient ID: JOMARIE EDMONDS, female   DOB: Apr 10, 1956, 60 y.o.   MRN: [id number] Patient ID: MANE WEITMAN, female   DOB: 1955/12/06, 60 y.o.   MRN: [id number] Patient ID: ERYKA BUSTIN, female   DOB: 03/24/1956, 60 y.o.   MRN: [id number] Patient ID: YAREXY DELSORDO, female   DOB: 1956-05-10, 60 y.o.   MRN: [id number] [facility] [id number] Progress Note CEONA CONTORNO MRN: [id number] DOB: 1956-05-25 Age: 60 y.o.  Date: 06/11/2016 Start Time: 10:20 AM End Time: 10:45 AM  Chief Complaint: Chief Complaint  Patient presents with  . Depression  . Anxiety  . Follow-up   Subjective: I'm Not sleeping well  This patient is a 60 year old married white female who lives in [place] with her husband they have no children. 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  The patient states she's had a history of mental illness dating back to age 34. At that time she was hospitalized for severe depression with suicidal ideation. She's had approximately 5 more hospitalizations since then. At times she's been extremely depressed and had been cutting herself 4 years ago prior to her last hospitalization. She's had bad reactions to Haldol and Depakote but is currently doing well on her current regimen.  She's sleeping well, her energy is good and her mood is good. She is enjoying all for  activities. She's had no thoughts whatsoever of self-harm. She's not had any side effects from Zyprexa. Her primary doctors following her labs and her blood sugar has been normal  The patient returns after 4 months. She's trying to stay busy. However her thoughts race at night and she keeps worrying about her brother who died a few months ago. He was found to have access morphine in his system and she knew he was abusing drugs. A corner ruled in a "suicide" but she really thinks he was just trying to get high on morphine. I explained that we don't know if something is a suicide without knowing the intent of the person. She states that the dosage of Zyprexa is not working for her and wonders if we can go higher to help with her sleep and racing thoughts and I think this is a reasonable idea. She denies suicidal ideation Zyprexa 5 mg at bedtime  Prozac 20 mg daily Cogentin 0.5 mg at bedtime    Past psychiatric history Patient has significant history of psychiatric illness. She has at least 6 psychiatric admission with 2 suicidal attempt. She had history of noncompliant with medication resulting in decompensation. She has history of mania and severe depression. In the past she had tried Seroquel, Cymbalta, lithium, Elavil, Depakote and Haldol. She has been stable on a small dose of olanzapine Prozac and Cogentin.  Allergies: Allergies  Allergen Reactions  .  Depakote [Divalproex Sodium] Other (See Comments)    Tremors so bad that she scalded herself twice with coffee.  . Lithium Nausea And Vomiting  . Reglan [Metoclopramide] Other (See Comments)    Mouth dystonia/distortion  . Codeine Nausea And Vomiting  . Latex    Medical History: Past Medical History:  Diagnosis Date  . Bipolar disorder ([place])   . Chronic kidney disease   . Depression   . GERD (gastroesophageal reflux disease)   . History of UTI   . Obesity   Patient see Dr. Manuella Ghazi in [place]. Patient is obese with a history of GERD, UTI, and  arthritis. She had shoulder surgery and recently hernia repair.  She is expected to have her left kidney removed next week.  Surgical History: Past Surgical History:  Procedure Laterality Date  . ABDOMINAL HYSTERECTOMY    . APPENDECTOMY    . HERNIA REPAIR    . Left Kidney and ureter removal  08/18/2012  . SHOULDER SURGERY     Family History: family history includes Alcohol abuse in her brother and father; Anxiety disorder in her brother; Bipolar disorder in her maternal aunt, maternal grandmother, and mother; Dementia in her father; Depression in her brother, father, and sister; Drug abuse in her brother; Seizures in her sister. Patient has multiple family member who has psychiatric illness. Her brother has been a patient in this office however now [facility]  She also has a family history of diabetes.  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 She denies any illegal drug use.  Psychosocial history Patient lives with her husband. She is currently not working. She has no children. She has good support system through her family. She is working as Psychologist, occupational at [employer].  Vitals: BP 116/61 (BP Location: Right Arm, Patient Position: Sitting)   Pulse 62   Ht 5\' 6"  (1.676 m)   Wt 207 lb (93.9 kg)   BMI 33.41 kg/m  Lost 2 pounds since last visit.  Mental status examination Patient is well-groomed and well-dressed.she appears tired She is cooperative and pleasant and maintained good eye contact. Her speech is soft and clear.  Her thought process is logical and goal-directed.  She described her mood as anxious and her affect is mood congruent  She denies any active or passive suicidal thinking and homicidal thinking. She denies any auditory or visual hallucination. She denies any paranoid delusions or thinking. Her attention and concentration is fair. Her thought process is logical  linear and goal-directed. She's alert and oriented x3. Her insight judgment and impulse control is okay.  Lab Results:  No results found for this or any previous visit (from the past 8736 hour(s)).   Assessment Axis I Bipolar disorder NOS Axis II deferred Axis III see medical history Axis IV mild to moderate Axis V 80  Plan: I took her vitals.  I reviewed CC, tobacco/med/surg Hx, meds effects/ side effects, problem list, therapies and responses as well as current situation/symptoms discussed options. Continue Prozac for depression, Zyprexa for mood stabilization and Cogentin to prevent side effects from Zyprexa. She'll increase Zyprexa to 10 mg at bedtime and return in 4 weeks See orders and pt instructions for more details. MEDICATIONS this encounter: Meds ordered this encounter  Medications  . FLUoxetine (PROZAC) 20 MG capsule    Sig: Take 1 capsule (20 mg total) by mouth daily.  Dispense:  30 capsule    Refill:  3  . OLANZapine (ZYPREXA) 10 MG tablet    Sig: Take 1 tablet (10 mg total) by mouth at bedtime.    Dispense:  30 tablet    Refill:  2   Medical Decision Making Problem Points:  Established problem, stable/improving (1), Review of last therapy session (1) and Review of psycho-social stressors (1) Data Points:  Review or order clinical lab tests (1) Review of medication regiment & side effects (2) and Review of other somatic treatments recommended (1)  I certify that outpatient services furnished can reasonably be expected to improve the patient's condition.   Levonne Spiller, MD

## 2016-07-10 ENCOUNTER — Ambulatory Visit (INDEPENDENT_AMBULATORY_CARE_PROVIDER_SITE_OTHER): Payer: BLUE CROSS/BLUE SHIELD | Admitting: Psychiatry

## 2016-07-10 ENCOUNTER — Encounter (HOSPITAL_COMMUNITY): Payer: Self-pay | Admitting: Psychiatry

## 2016-07-10 VITALS — BP 115/61 | HR 83 | Ht 66.0 in | Wt 211.2 lb

## 2016-07-10 DIAGNOSIS — Z888 Allergy status to other drugs, medicaments and biological substances status: Secondary | ICD-10-CM | POA: Diagnosis not present

## 2016-07-10 DIAGNOSIS — Z91040 Latex allergy status: Secondary | ICD-10-CM

## 2016-07-10 DIAGNOSIS — F319 Bipolar disorder, unspecified: Secondary | ICD-10-CM

## 2016-07-10 DIAGNOSIS — Z9889 Other specified postprocedural states: Secondary | ICD-10-CM

## 2016-07-10 MED ORDER — BENZTROPINE MESYLATE 0.5 MG PO TABS: 0.5000 mg | ORAL_TABLET | Freq: Every day | ORAL | 3 refills | Status: DC

## 2016-07-10 MED ORDER — FLUOXETINE HCL 20 MG PO CAPS: 20.0000 mg | ORAL_CAPSULE | Freq: Every day | ORAL | 3 refills | Status: DC

## 2016-07-10 MED ORDER — OLANZAPINE 10 MG PO TABS: 10.0000 mg | ORAL_TABLET | Freq: Every day | ORAL | 3 refills | Status: DC

## 2016-07-10 NOTE — Progress Notes (Signed)
Patient ID: ELYSABETH Lara, female   DOB: [birth year], 60 y.o.   MRN: [id number] Patient ID: Amanda Lara, female   DOB: [birth year], 60 y.o.   MRN: [id number] Patient ID: Amanda Lara, female   DOB: [birth year], 60 y.o.   MRN: [id number] Patient ID: Amanda Lara, female   DOB: December 09, 1955, 60 y.o.   MRN: [id number] Patient ID: Amanda Lara, female   DOB: 03/01/1956, 60 y.o.   MRN: [id number] Patient ID: Amanda Lara, female   DOB: [birth year], 60 y.o.   MRN: [id number] Patient ID: Amanda Lara, female   DOB: 01/19/1956, 60 y.o.   MRN: [id number] Patient ID: Amanda Lara, female   DOB: 1956-02-19, 60 y.o.   MRN: [id number] Patient ID: Amanda Lara, female   DOB: 01-Dec-1955, 60 y.o.   MRN: [id number] Patient ID: Amanda Lara, female   DOB: Nov 15, 1955, 60 y.o.   MRN: [id number] Patient ID: Amanda Lara, female   DOB: February 11, 1956, 60 y.o.   MRN: [id number] [facility] [id number] Progress Note Amanda Lara MRN: [id number] DOB: [birth year] Age: 60 y.o.  Date: 07/10/2016 Start Time: 10:20 AM End Time: 10:45 AM  Chief Complaint: Chief Complaint  Patient presents with  . Depression  . Anxiety  . Follow-up   Subjective: I'm Not sleeping well  This patient is a 60 year old married white female who lives in [place] with her husband they have no children. 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  The patient states she's had a history of mental illness dating back to age 60. At that time she was hospitalized for severe depression with suicidal ideation. She's had approximately 5 more hospitalizations since then. At times she's been extremely depressed and had been cutting herself 4 years ago prior to her last hospitalization. She's had bad reactions to Haldol and Depakote but is currently doing well on her current regimen.  She's sleeping well, her energy is good and her mood is good. She is enjoying all for  activities. She's had no thoughts whatsoever of self-harm. She's not had any side effects from Zyprexa. Her primary doctors following her labs and her blood sugar has been normal  The patient returns after 4 weeks. Last time she was having racing thoughts and poor sleep. She is thinking a lot about her brother who died suddenly probably due to overuse of morphine. We have increased her Zyprexa to 10 mg. She is now feeling better sleeping better and her thoughts are no longer racing. She is enjoying time with her 61-year-old nephew. 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 She has at least 6 psychiatric admission with 2 suicidal attempt. She had history of noncompliant with medication resulting in decompensation. She has history of mania and severe depression. In the past she had tried Seroquel, Cymbalta, lithium, Elavil, Depakote and Haldol. She has been stable on a small dose of olanzapine Prozac and Cogentin.  Allergies: Allergies  Allergen Reactions  . Depakote [Divalproex Sodium] Other (See Comments)    Tremors so bad that she scalded herself twice with coffee.  . Lithium Nausea And Vomiting  . Reglan [Metoclopramide] Other (See Comments)    Mouth dystonia/distortion  . Codeine Nausea And Vomiting  . Latex  Medical History: Past Medical History:  Diagnosis Date  . Bipolar disorder ([place])   . Chronic kidney disease   . Depression   . GERD (gastroesophageal reflux disease)   . History of UTI   . Obesity   Patient see Dr. Manuella Ghazi in [place]. Patient is obese with a history of GERD, UTI, and arthritis. She had shoulder surgery and recently hernia repair.  She is expected to have her left kidney removed next week.  Surgical History: Past Surgical History:  Procedure Laterality Date  . ABDOMINAL HYSTERECTOMY    .  APPENDECTOMY    . HERNIA REPAIR    . Left Kidney and ureter removal  08/18/2012  . SHOULDER SURGERY     Family History: family history includes Alcohol abuse in her brother and father; Anxiety disorder in her brother; Bipolar disorder in her maternal aunt, maternal grandmother, and mother; Dementia in her father; Depression in her brother, father, and sister; Drug abuse in her brother; Seizures in her sister. Patient has multiple family member who has psychiatric illness. Her brother has been a patient in this office however now he [facility]  She also has a family history of diabetes.  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 She denies any illegal drug use.  Psychosocial history Patient lives with her husband. She is currently not working. She has no children. She has good support system through her family. She is working as Psychologist, occupational at [employer].  Vitals: BP 115/61 (BP Location: Right Arm, Patient Position: Sitting, Cuff Size: Large)   Pulse 83   Ht 5\' 6"  (1.676 m)   Wt 211 lb 3.2 oz (95.8 kg)   BMI 34.09 kg/m  Lost 2 pounds since last visit.  Mental status examination Patient is well-groomed and well-dressed.she appears tired She is cooperative and pleasant and maintained good eye contact. Her speech is soft and clear.  Her thought process is logical and goal-directed.  She described her mood as good and her affect is bright  She denies any active or passive suicidal thinking and homicidal thinking. She denies any auditory or visual hallucination. She denies any paranoid delusions or thinking. Her attention and concentration is fair. Her thought process is logical linear and goal-directed. She's alert and oriented x3. Her insight judgment and impulse control is okay.  Lab Results:  No results found for this or any previous visit (from the past 8736  hour(s)).   Assessment Axis I Bipolar disorder NOS Axis II deferred Axis III see medical history Axis IV mild to moderate Axis V 80  Plan: I took her vitals.  I reviewed CC, tobacco/med/surg Hx, meds effects/ side effects, problem list, therapies and responses as well as current situation/symptoms discussed options. Continue Prozac for depression, Zyprexa for mood stabilization and Cogentin to prevent side effects from Zyprexa. She will return in 3 months See orders and pt instructions for more details. MEDICATIONS this encounter: Meds ordered this encounter  Medications  . FLUoxetine (PROZAC) 20 MG capsule    Sig: Take 1 capsule (20 mg total) by mouth daily.    Dispense:  30 capsule    Refill:  3  . benztropine (COGENTIN) 0.5 MG tablet    Sig: Take 1 tablet (0.5 mg total) by mouth daily.    Dispense:  30 tablet    Refill:  3  . OLANZapine (ZYPREXA) 10 MG tablet  Sig: Take 1 tablet (10 mg total) by mouth at bedtime.    Dispense:  30 tablet    Refill:  3   Medical Decision Making Problem Points:  Established problem, stable/improving (1), Review of last therapy session (1) and Review of psycho-social stressors (1) Data Points:  Review or order clinical lab tests (1) Review of medication regiment & side effects (2) and Review of other somatic treatments recommended (1)  I certify that outpatient services furnished can reasonably be expected to improve the patient's condition.   Levonne Spiller, MDPatient ID: Amanda Lara, female   DOB: July 07, 1956, 60 y.o.   MRN: [id number]

## 2016-07-25 ENCOUNTER — Other Ambulatory Visit (HOSPITAL_COMMUNITY): Payer: Self-pay | Admitting: Psychiatry

## 2016-08-06 ENCOUNTER — Telehealth: Payer: Self-pay

## 2016-08-07 NOTE — Telephone Encounter (Signed)
Gastroenterology Pre-Procedure Review  Request Date: 08/06/2016 Requesting Physician: Dr. Manuella Ghazi  PATIENT REVIEW QUESTIONS: The patient responded to the following health history questions as indicated:    Last colonoscopy 10 years ago by Dr. Oneida Alar  1. Diabetes Melitis: no 2. Joint replacements in the past 12 months: no 3. Major health problems in the past 3 months: no 4. Has an artificial valve or MVP: no 5. Has a defibrillator: no 6. Has been advised in past to take antibiotics in advance of a procedure like teeth cleaning: no 7. Family history of colon cancer: no  8. Alcohol Use: no 9. History of sleep apnea: no  10. History of coronary artery or other vascular stents placed within the last 12 months: no    MEDICATIONS & ALLERGIES:    Patient reports the following regarding taking any blood thinners:   Plavix? no Aspirin? no Coumadin? no Brilinta? no Xarelto? no Eliquis? no Pradaxa? no Savaysa? no Effient? no  Patient confirms/reports the following medications:  Current Outpatient Prescriptions  Medication Sig Dispense Refill  . benztropine (COGENTIN) 0.5 MG tablet Take 1 tablet (0.5 mg total) by mouth daily. 30 tablet 3  . Fish Oil-Cholecalciferol (FISH OIL + D3 PO) Take 1 capsule by mouth at bedtime.    Marland Kitchen FLUoxetine (PROZAC) 20 MG capsule Take 1 capsule (20 mg total) by mouth daily. 30 capsule 3  . naproxen sodium (ANAPROX) 220 MG tablet Take 220 mg by mouth daily as needed.    Marland Kitchen OLANZapine (ZYPREXA) 10 MG tablet Take 1 tablet (10 mg total) by mouth at bedtime. 30 tablet 3  . pantoprazole (PROTONIX) 40 MG tablet Take 40 mg by mouth daily.      . Multiple Vitamins-Minerals (HAIR/SKIN/NAILS PO) Take 1 capsule by mouth at bedtime.     No current facility-administered medications for this visit.     Patient confirms/reports the following allergies:  Allergies  Allergen Reactions  . Depakote [Divalproex Sodium] Other (See Comments)    Tremors so bad that she scalded  herself twice with coffee.  . Lithium Nausea And Vomiting  . Reglan [Metoclopramide] Other (See Comments)    Mouth dystonia/distortion  . Codeine Nausea And Vomiting  . Latex     No orders of the defined types were placed in this encounter.   AUTHORIZATION INFORMATION Primary Insurance:   ID #:  Group #:  Pre-Cert / Auth required: { Pre-Cert / Auth #:   Secondary Insurance:  ID #:  Group #:  Pre-Cert / Auth required:  Pre-Cert / Auth #:   SCHEDULE INFORMATION: Procedure has been scheduled as follows:  Date:               Time:   Location:   This Gastroenterology Pre-Precedure Review Form is being routed to the following provider(s): Barney Drain, MD

## 2016-08-08 NOTE — Telephone Encounter (Signed)
MOVI PREP SPLIT DOSING, REGULAR BREAKFAST. CLEAR LIQUIDS AFTER 9 AM.  WILL NEED PHEENRGAN 12.5 MG IV IN PREOP AFTER CONSENT OBTAINED.

## 2016-08-13 ENCOUNTER — Other Ambulatory Visit: Payer: Self-pay

## 2016-08-13 DIAGNOSIS — Z1211 Encounter for screening for malignant neoplasm of colon: Secondary | ICD-10-CM

## 2016-08-13 MED ORDER — PEG-KCL-NACL-NASULF-NA ASC-C 100 G PO SOLR: 1.0000 | ORAL | 0 refills | Status: DC

## 2016-08-13 NOTE — Addendum Note (Signed)
Addended by: Everardo All on: 08/13/2016 02:17 PM   Modules accepted: Orders

## 2016-08-13 NOTE — Telephone Encounter (Signed)
Pt has been scheduled for her colonoscopy on 08/24/2016 at 11:30 AM. Rx sent to the pharmacy and instructions mailed to pt.

## 2016-08-13 NOTE — Telephone Encounter (Signed)
LMOM to call for date and time.  

## 2016-08-14 MED ORDER — PEG 3350-KCL-NA BICARB-NACL 420 G PO SOLR: 4000.0000 mL | ORAL | 0 refills | Status: DC

## 2016-08-14 NOTE — Addendum Note (Signed)
Addended by: Everardo All on: 08/14/2016 09:26 AM   Modules accepted: Orders

## 2016-08-14 NOTE — Telephone Encounter (Signed)
Note on instructions for register time of 10:30 Am and procedure time of 11:30 Am.

## 2016-08-14 NOTE — Telephone Encounter (Signed)
Movie prep not covered by insurance and sending in trilyte and mailing new instructions. LMOM for a return call from pt.

## 2016-08-23 ENCOUNTER — Telehealth: Payer: Self-pay

## 2016-08-23 NOTE — Telephone Encounter (Signed)
I called [employer] @ [phone] and spoke to Marisa Sprinkles who said PA is not required for the screening colonoscopy. No reference number required.

## 2016-08-24 ENCOUNTER — Encounter (HOSPITAL_COMMUNITY)
Admission: RE | Disposition: A | Discharge status: Home / Self Care | Payer: Self-pay | Source: Ambulatory Visit | Attending: Gastroenterology

## 2016-08-24 ENCOUNTER — Ambulatory Visit (HOSPITAL_COMMUNITY)
Admission: RE | Admit: 2016-08-24 | Discharge: 2016-08-24 | Disposition: A | Discharge status: Home / Self Care | Payer: BLUE CROSS/BLUE SHIELD | Source: Ambulatory Visit | Attending: Gastroenterology | Admitting: Gastroenterology

## 2016-08-24 ENCOUNTER — Encounter (HOSPITAL_COMMUNITY): Payer: Self-pay | Admitting: *Deleted

## 2016-08-24 DIAGNOSIS — Z6833 Body mass index (BMI) 33.0-33.9, adult: Secondary | ICD-10-CM | POA: Insufficient documentation

## 2016-08-24 DIAGNOSIS — Z888 Allergy status to other drugs, medicaments and biological substances status: Secondary | ICD-10-CM | POA: Insufficient documentation

## 2016-08-24 DIAGNOSIS — Z1212 Encounter for screening for malignant neoplasm of rectum: Secondary | ICD-10-CM | POA: Diagnosis not present

## 2016-08-24 DIAGNOSIS — Z91040 Latex allergy status: Secondary | ICD-10-CM | POA: Diagnosis not present

## 2016-08-24 DIAGNOSIS — D122 Benign neoplasm of ascending colon: Secondary | ICD-10-CM | POA: Diagnosis not present

## 2016-08-24 DIAGNOSIS — M1990 Unspecified osteoarthritis, unspecified site: Secondary | ICD-10-CM | POA: Diagnosis not present

## 2016-08-24 DIAGNOSIS — E669 Obesity, unspecified: Secondary | ICD-10-CM | POA: Insufficient documentation

## 2016-08-24 DIAGNOSIS — K219 Gastro-esophageal reflux disease without esophagitis: Secondary | ICD-10-CM | POA: Diagnosis not present

## 2016-08-24 DIAGNOSIS — F319 Bipolar disorder, unspecified: Secondary | ICD-10-CM | POA: Insufficient documentation

## 2016-08-24 DIAGNOSIS — D125 Benign neoplasm of sigmoid colon: Secondary | ICD-10-CM | POA: Diagnosis not present

## 2016-08-24 DIAGNOSIS — D123 Benign neoplasm of transverse colon: Secondary | ICD-10-CM

## 2016-08-24 DIAGNOSIS — K648 Other hemorrhoids: Secondary | ICD-10-CM | POA: Diagnosis not present

## 2016-08-24 DIAGNOSIS — Z885 Allergy status to narcotic agent status: Secondary | ICD-10-CM | POA: Diagnosis not present

## 2016-08-24 DIAGNOSIS — Z1211 Encounter for screening for malignant neoplasm of colon: Secondary | ICD-10-CM | POA: Diagnosis present

## 2016-08-24 DIAGNOSIS — Q438 Other specified congenital malformations of intestine: Secondary | ICD-10-CM | POA: Insufficient documentation

## 2016-08-24 DIAGNOSIS — D124 Benign neoplasm of descending colon: Secondary | ICD-10-CM

## 2016-08-24 HISTORY — PX: POLYPECTOMY: SHX5525

## 2016-08-24 HISTORY — PX: COLONOSCOPY: SHX5424

## 2016-08-24 HISTORY — DX: Unspecified osteoarthritis, unspecified site: M19.90

## 2016-08-24 SURGERY — COLONOSCOPY
Anesthesia: Moderate Sedation

## 2016-08-24 MED ORDER — MEPERIDINE HCL 100 MG/ML IJ SOLN
INTRAMUSCULAR | Status: DC | PRN
  Administered 2016-08-24: 50 mg via INTRAVENOUS
  Administered 2016-08-24 (×2): 25 mg via INTRAVENOUS

## 2016-08-24 MED ORDER — PROMETHAZINE HCL 25 MG/ML IJ SOLN
12.5000 mg | Freq: Once | INTRAMUSCULAR | Status: AC
  Administered 2016-08-24: 12.5 mg via INTRAVENOUS

## 2016-08-24 MED ORDER — MIDAZOLAM HCL 5 MG/5ML IJ SOLN
INTRAMUSCULAR | Status: AC
  Filled 2016-08-24: qty 10

## 2016-08-24 MED ORDER — MIDAZOLAM HCL 5 MG/5ML IJ SOLN
INTRAMUSCULAR | Status: DC | PRN
  Administered 2016-08-24 (×2): 2 mg via INTRAVENOUS
  Administered 2016-08-24: 1 mg via INTRAVENOUS

## 2016-08-24 MED ORDER — MEPERIDINE HCL 100 MG/ML IJ SOLN
INTRAMUSCULAR | Status: AC
  Filled 2016-08-24: qty 2

## 2016-08-24 MED ORDER — SODIUM CHLORIDE 0.9 % IV SOLN: INTRAVENOUS | Status: DC

## 2016-08-24 MED ORDER — PROMETHAZINE HCL 25 MG/ML IJ SOLN
INTRAMUSCULAR | Status: AC
  Filled 2016-08-24: qty 1

## 2016-08-24 MED ORDER — SODIUM CHLORIDE 0.9% FLUSH
INTRAVENOUS | Status: AC
  Filled 2016-08-24: qty 10

## 2016-08-24 NOTE — Op Note (Signed)
[facility] Patient Name: Amanda Lara Procedure Date: 08/24/2016 11:38 AM MRN: [id number] Date of Birth: [birth year] Attending MD: Barney Drain , MD CSN: [id number] Age: 60 Admit Type: Outpatient Procedure:                Colonoscopy WITH COLD FORCEPS/SNARE POLYPECTOMY Indications:              Screening for colorectal malignant neoplasm Providers:                Barney Drain, MD, Janeece Riggers, RN, Randa Spike,                            Technician Referring MD:             Fuller Canada Manuella Ghazi, MD Medicines:                Promethazine 12.5 mg IV, Meperidine 100 mg IV,                            Midazolam 5 mg IV Complications:            No immediate complications. Estimated Blood Loss:     Estimated blood loss was minimal. Procedure:                Pre-Anesthesia Assessment:                           - Prior to the procedure, a History and Physical                            was performed, and patient medications and                            allergies were reviewed. The patient's tolerance of                            previous anesthesia was also reviewed. The risks                            and benefits of the procedure and the sedation                            options and risks were discussed with the patient.                            All questions were answered, and informed consent                            was obtained. Prior Anticoagulants: The patient has                            taken no previous anticoagulant or antiplatelet                            agents. ASA Grade Assessment: II - A patient with  mild systemic disease. After reviewing the risks                            and benefits, the patient was deemed in                            satisfactory condition to undergo the procedure.                            After obtaining informed consent, the colonoscope                            was passed under direct vision. Throughout  the                            procedure, the patient's blood pressure, pulse, and                            oxygen saturations were monitored continuously. The                            Colonoscope was introduced through the anus and                            advanced to the the cecum, identified by                            appendiceal orifice and ileocecal valve. The                            ileocecal valve, appendiceal orifice, and rectum                            were photographed. The colonoscopy was technically                            difficult and complex due to significant looping.                            Successful completion of the procedure was aided by                            increasing the dose of sedation medication,                            changing the patient to a supine position,                            straightening and shortening the scope to obtain                            bowel loop reduction, applying abdominal pressure  and COLOWRAP. The quality of the bowel preparation                            was excellent. The patient tolerated the procedure                            fairly well. Scope In: 12:12:00 PM Scope Out: 12:27:24 PM Scope Withdrawal Time: 0 hours 2 minutes 40 seconds  Total Procedure Duration: 0 hours 15 minutes 24 seconds  Findings:      The recto-sigmoid colon, sigmoid colon and descending colon were       significantly redundant.      A 4 mm polyp was found in the sigmoid colon. The polyp was sessile. The       polyp was removed with a cold biopsy forceps. Resection and retrieval       were complete.      Three sessile polyps were found in the descending colon and proximal       ascending colon(2). The polyps were 5 to 7 mm in size. These polyps were       removed with a hot snare. Resection and retrieval were complete.      A 6 mm polyp was found in the hepatic flexure. The polyp was sessile.        The polyp was removed with a hot snare. Resection was complete, but the       polyp tissue was not retrieved.      Internal hemorrhoids were found. The hemorrhoids were moderate. Impression:               - Redundant colon.                           - FOUR COLORECTAL POLYPS REMOVED                           - One 6 mm polyp at the hepatic flexure, removed                            with a hot snare. Complete resection. Polyp tissue                            not retrieved.                           - SMALL Internal hemorrhoids. Moderate Sedation:      Moderate (conscious) sedation was administered by the endoscopy nurse       and supervised by the endoscopist. The following parameters were       monitored: oxygen saturation, heart rate, blood pressure, and response       to care. Total physician intraservice time was 49 minutes. Recommendation:           - High fiber diet.                           - Continue present medications.                           - Await pathology results.                           -  Repeat colonoscopy in 3 years for surveillance.                           - Patient has a contact number available for                            emergencies. The signs and symptoms of potential                            delayed complications were discussed with the                            patient. Return to normal activities tomorrow.                            Written discharge instructions were provided to the                            patient. Procedure Code(s):        --- Professional ---                           [phone], Colonoscopy, flexible; with removal of                            tumor(s), polyp(s), or other lesion(s) by snare                            technique                           45380, 59, Colonoscopy, flexible; with biopsy,                            single or multiple                           99152, Moderate sedation services provided by the                             same physician or other qualified health care                            professional performing the diagnostic or                            therapeutic service that the sedation supports,                            requiring the presence of an independent trained                            observer to assist in the monitoring of the  patient's level of consciousness and physiological                            status; initial 15 minutes of intraservice time,                            patient age 28 years or older                           [phone], Moderate sedation services; each additional                            15 minutes intraservice time                           [phone], Moderate sedation services; each additional                            15 minutes intraservice time Diagnosis Code(s):        --- Professional ---                           Z12.11, Encounter for screening for malignant                            neoplasm of colon                           D12.5, Benign neoplasm of sigmoid colon                           D12.3, Benign neoplasm of transverse colon (hepatic                            flexure or splenic flexure)                           D12.4, Benign neoplasm of descending colon                           D12.2, Benign neoplasm of ascending colon                           K64.8, Other hemorrhoids                           Q43.8, Other specified congenital malformations of                            intestine CPT copyright 2016 [employer]. All rights reserved. The codes documented in this report are preliminary and upon coder review may  be revised to meet current compliance requirements. Barney Drain, MD Barney Drain, MD 08/24/2016 1:04:44 PM This report has been signed electronically. Number of Addenda: 0

## 2016-08-24 NOTE — Discharge Instructions (Signed)
You had 5 polyps removed but only four retrieved. You have small internal hemorrhoids. YOU HAVE An extremely redundant LEFT COLON.    CONTINUE YOUR WEIGHT LOSS EFFORTS. LOSE TEN POUNDS.  WHILE I DO NOT WANT TO ALARM YOU, YOUR BODY MASS INDEX IS OVER 30 WHICH MEANS YOU ARE OBESE. OBESITY IS ASSOCIATED WITH AN INCREASE RISK FOR ALL CANCERS, INCLUDING ESOPHAGEAL AND COLON CANCER.  DRINK WATER TO KEEP YOUR URINE LIGHT YELLOW.  FOLLOW A HIGH FIBER DIET. AVOID ITEMS THAT CAUSE BLOATING & GAS. SEE INFO BELOW.  YOUR BIOPSY RESULTS WILL BE AVAILABLE IN MY CHART AFTER JAN 4 AND MY OFFICE WILL CONTACT YOU IN 10-14 DAYS WITH YOUR RESULTS.   Next colonoscopy in 3-5 years.    Colonoscopy Care After Read the instructions outlined below and refer to this sheet in the next week. These discharge instructions provide you with general information on caring for yourself after you leave the hospital. While your treatment has been planned according to the most current medical practices available, unavoidable complications occasionally occur. If you have any problems or questions after discharge, call DR. Quang Thorpe, [phone].  ACTIVITY  You may resume your regular activity, but move at a slower pace for the next 24 hours.   Take frequent rest periods for the next 24 hours.   Walking will help get rid of the air and reduce the bloated feeling in your belly (abdomen).   No driving for 24 hours (because of the medicine (anesthesia) used during the test).   You may shower.   Do not sign any important legal documents or operate any machinery for 24 hours (because of the anesthesia used during the test).    NUTRITION  Drink plenty of fluids.   You may resume your normal diet as instructed by your doctor.   Begin with a light meal and progress to your normal diet. Heavy or fried foods are harder to digest and may make you feel sick to your stomach (nauseated).   Avoid alcoholic beverages for 24 hours or  as instructed.    MEDICATIONS  You may resume your normal medications.   WHAT YOU CAN EXPECT TODAY  Some feelings of bloating in the abdomen.   Passage of more gas than usual.   Spotting of blood in your stool or on the toilet paper  .  IF YOU HAD POLYPS REMOVED DURING THE COLONOSCOPY:  Eat a soft diet IF YOU HAVE NAUSEA, BLOATING, ABDOMINAL PAIN, OR VOMITING.    FINDING OUT THE RESULTS OF YOUR TEST Not all test results are available during your visit. DR. Oneida Alar WILL CALL YOU WITHIN 14 DAYS OF YOUR PROCEDUE WITH YOUR RESULTS. Do not assume everything is normal if you have not heard from DR. Lamone Ferrelli, CALL HER OFFICE AT [phone].  SEEK IMMEDIATE MEDICAL ATTENTION AND CALL THE OFFICE: [phone] IF:  You have more than a spotting of blood in your stool.   Your belly is swollen (abdominal distention).   You are nauseated or vomiting.   You have a temperature over 101F.   You have abdominal pain or discomfort that is severe or gets worse throughout the day.   High-Fiber Diet A high-fiber diet changes your normal diet to include more whole grains, legumes, fruits, and vegetables. Changes in the diet involve replacing refined carbohydrates with unrefined foods. The calorie level of the diet is essentially unchanged. The Dietary Reference Intake (recommended amount) for adult males is 38 grams per day. For adult females, it is 80  grams per day. Pregnant and lactating women should consume 28 grams of fiber per day. Fiber is the intact part of a plant that is not broken down during digestion. Functional fiber is fiber that has been isolated from the plant to provide a beneficial effect in the body. PURPOSE  Increase stool bulk.   Ease and regulate bowel movements.   Lower cholesterol.   REDUCE RISK OF COLON CANCER  INDICATIONS THAT YOU NEED MORE FIBER  Constipation and hemorrhoids.   Uncomplicated diverticulosis (intestine condition) and irritable bowel syndrome.     Weight management.   As a protective measure against hardening of the arteries (atherosclerosis), diabetes, and cancer.   GUIDELINES FOR INCREASING FIBER IN THE DIET  Start adding fiber to the diet slowly. A gradual increase of about 5 more grams (2 slices of whole-wheat bread, 2 servings of most fruits or vegetables, or 1 bowl of high-fiber cereal) per day is best. Too rapid an increase in fiber may result in constipation, flatulence, and bloating.   Drink enough water and fluids to keep your urine clear or pale yellow. Water, juice, or caffeine-free drinks are recommended. Not drinking enough fluid may cause constipation.   Eat a variety of high-fiber foods rather than one type of fiber.   Try to increase your intake of fiber through using high-fiber foods rather than fiber pills or supplements that contain small amounts of fiber.   The goal is to change the types of food eaten. Do not supplement your present diet with high-fiber foods, but replace foods in your present diet.   INCLUDE A VARIETY OF FIBER SOURCES  Replace refined and processed grains with whole grains, canned fruits with fresh fruits, and incorporate other fiber sources. White rice, white breads, and most bakery goods contain little or no fiber.   Brown whole-grain rice, buckwheat oats, and many fruits and vegetables are all good sources of fiber. These include: broccoli, Brussels sprouts, cabbage, cauliflower, beets, sweet potatoes, white potatoes (skin on), carrots, tomatoes, eggplant, squash, berries, fresh fruits, and dried fruits.   Cereals appear to be the richest source of fiber. Cereal fiber is found in whole grains and bran. Bran is the fiber-rich outer coat of cereal grain, which is largely removed in refining. In whole-grain cereals, the bran remains. In breakfast cereals, the largest amount of fiber is found in those with "bran" in their names. The fiber content is sometimes indicated on the label.   You may  need to include additional fruits and vegetables each day.   In baking, for 1 cup white flour, you may use the following substitutions:   1 cup whole-wheat flour minus 2 tablespoons.   1/2 cup white flour plus 1/2 cup whole-wheat flour.   Polyps, Colon  A polyp is extra tissue that grows inside your body. Colon polyps grow in the large intestine. The large intestine, also called the colon, is part of your digestive system. It is a long, hollow tube at the end of your digestive tract where your body makes and stores stool. Most polyps are not dangerous. They are benign. This means they are not cancerous. But over time, some types of polyps can turn into cancer. Polyps that are smaller than a pea are usually not harmful. But larger polyps could someday become or may already be cancerous. To be safe, doctors remove all polyps and test them.   WHO GETS POLYPS? Anyone can get polyps, but certain people are more likely than others. You may have  a greater chance of getting polyps if:  You are over 50.   You have had polyps before.   Someone in your family has had polyps.   Someone in your family has had cancer of the large intestine.   Find out if someone in your family has had polyps. You may also be more likely to get polyps if you:   Eat a lot of fatty foods   Smoke   Drink alcohol   Do not exercise  Eat too much   PREVENTION There is not one sure way to prevent polyps. You might be able to lower your risk of getting them if you:  Eat more fruits and vegetables and less fatty food.   Do not smoke.   Avoid alcohol.   Exercise every day.   Lose weight if you are overweight.   Eating more calcium and folate can also lower your risk of getting polyps. Some foods that are rich in calcium are milk, cheese, and broccoli. Some foods that are rich in folate are chickpeas, kidney beans, and spinach.   Hemorrhoids Hemorrhoids are dilated (enlarged) veins around the rectum. Sometimes  clots will form in the veins. This makes them swollen and painful. These are called thrombosed hemorrhoids. Causes of hemorrhoids include:  Constipation.   Straining to have a bowel movement.   HEAVY LIFTING  HOME CARE INSTRUCTIONS  Eat a well balanced diet and drink 6 to 8 glasses of water every day to avoid constipation. You may also use a bulk laxative.   Avoid straining to have bowel movements.   Keep anal area dry and clean.   Do not use a donut shaped pillow or sit on the toilet for long periods. This increases blood pooling and pain.   Move your bowels when your body has the urge; this will require less straining and will decrease pain and pressure.

## 2016-08-24 NOTE — H&P (Signed)
Primary Care Physician:  Monico Blitz, MD Primary Gastroenterologist:  Dr. Oneida Alar  Pre-Procedure History & Physical: HPI:  Amanda Lara is a 60 y.o. female here for [place].  Past Medical History:  Diagnosis Date  . Arthritis   . Bipolar disorder ([place])   . Chronic kidney disease   . Depression   . GERD (gastroesophageal reflux disease)   . History of UTI   . Obesity     Past Surgical History:  Procedure Laterality Date  . ABDOMINAL HYSTERECTOMY    . APPENDECTOMY    . HERNIA REPAIR    . Left Kidney and ureter removal  08/18/2012  . SHOULDER SURGERY      Prior to Admission medications   Medication Sig Start Date End Date Taking? Authorizing Provider  benztropine (COGENTIN) 0.5 MG tablet Take 1 tablet (0.5 mg total) by mouth daily. 07/10/16  Yes Cloria Spring, MD  FLUoxetine (PROZAC) 20 MG capsule Take 1 capsule (20 mg total) by mouth daily. 07/10/16  Yes Cloria Spring, MD  naproxen sodium (ANAPROX) 220 MG tablet Take 440 mg by mouth 2 (two) times daily as needed (for pain.).    Yes Historical Provider, MD  OLANZapine (ZYPREXA) 10 MG tablet Take 1 tablet (10 mg total) by mouth at bedtime. 07/10/16 07/10/17 Yes Cloria Spring, MD  Omega-3 Fatty Acids (FISH OIL) 1200 MG CAPS Take 1,200 mg by mouth daily.   Yes Historical Provider, MD  pantoprazole (PROTONIX) 40 MG tablet Take 40 mg by mouth daily.     Yes Historical Provider, MD  polyethylene glycol-electrolytes (TRILYTE) 420 g solution Take 4,000 mLs by mouth as directed. 08/14/16  Yes Danie Binder, MD  peg 3350 powder (MOVIPREP) 100 g SOLR Take 1 kit (200 g total) by mouth as directed. Patient not taking: Reported on 08/17/2016 08/13/16   Danie Binder, MD    Allergies as of 08/13/2016 - Review Complete 08/06/2016  Allergen Reaction Noted  . Depakote [divalproex sodium] Other (See Comments) 08/13/2012  . Lithium Nausea And Vomiting 08/13/2012  . Reglan [metoclopramide] Other (See Comments) 08/13/2012   . Codeine Nausea And Vomiting 08/07/2011  . Latex  08/07/2011    Family History  Problem Relation Age of Onset  . Dementia Father   . Depression Father   . Alcohol abuse Father   . Depression Brother   . Drug abuse Brother   . Alcohol abuse Brother   . Anxiety disorder Brother   . Depression Sister   . Seizures Sister   . Bipolar disorder Mother   . Bipolar disorder Maternal Aunt   . Bipolar disorder Maternal Grandmother   . ADD / ADHD Neg Hx   . OCD Neg Hx   . Paranoid behavior Neg Hx   . Schizophrenia Neg Hx   . Sexual abuse Neg Hx   . Physical abuse Neg Hx     Social History   Social History  . Marital status: Married    Spouse name: N/A  . Number of children: N/A  . Years of education: N/A   Occupational History  . Not on file.   Social History Main Topics  . Smoking status: Never Smoker  . Smokeless tobacco: Never Used  . Alcohol use No  . Drug use: No  . Sexual activity: Not on file   Other Topics Concern  . Not on file   Social History Narrative  . No narrative on file    Review of Systems: See HPI,  otherwise negative ROS   Physical Exam: BP 124/74   Pulse 88   Temp 98 F (36.7 C) (Oral)   Resp 13   Ht '5\' 7"'$  (1.702 m)   Wt 211 lb (95.7 kg)   SpO2 96%   BMI 33.05 kg/m  General:   Alert,  pleasant and cooperative in NAD Head:  Normocephalic and atraumatic. Neck:  Supple; Lungs:  Clear throughout to auscultation.    Heart:  Regular rate and rhythm. Abdomen:  Soft, nontender and nondistended. Normal bowel sounds, without guarding, and without rebound.   Neurologic:  Alert and  oriented x4;  grossly normal neurologically.  Impression/Plan:     SCREENING  Plan:  1. TCS TODAY. DISCUSSED PROCEDURE, BENEFITS, & RISKS: < 1% chance of medication reaction, bleeding, perforation, or rupture of spleen/liver.

## 2016-08-28 ENCOUNTER — Encounter (HOSPITAL_COMMUNITY): Payer: Self-pay | Admitting: Gastroenterology

## 2016-08-29 ENCOUNTER — Telehealth: Payer: Self-pay | Admitting: Gastroenterology

## 2016-08-29 NOTE — Telephone Encounter (Signed)
Please call pt. She had FOUR simple adenomas removed.   CONTINUE YOUR WEIGHT LOSS EFFORTS. LOSE TEN POUNDS.  DRINK WATER TO KEEP YOUR URINE LIGHT YELLOW.  FOLLOW A HIGH FIBER DIET. AVOID ITEMS THAT CAUSE BLOATING & GAS.  Next colonoscopy in 3 years.

## 2016-08-30 NOTE — Telephone Encounter (Signed)
Reminder in epic °

## 2016-08-30 NOTE — Telephone Encounter (Signed)
Pt is aware of results .  NIC for 3 yr TCS

## 2016-10-10 ENCOUNTER — Encounter (HOSPITAL_COMMUNITY): Payer: Self-pay | Admitting: Psychiatry

## 2016-10-10 ENCOUNTER — Ambulatory Visit (INDEPENDENT_AMBULATORY_CARE_PROVIDER_SITE_OTHER): Payer: BLUE CROSS/BLUE SHIELD | Admitting: Psychiatry

## 2016-10-10 VITALS — BP 127/67 | HR 80 | Ht 67.0 in | Wt 210.6 lb

## 2016-10-10 DIAGNOSIS — Z91040 Latex allergy status: Secondary | ICD-10-CM | POA: Diagnosis not present

## 2016-10-10 DIAGNOSIS — F319 Bipolar disorder, unspecified: Secondary | ICD-10-CM

## 2016-10-10 DIAGNOSIS — Z9889 Other specified postprocedural states: Secondary | ICD-10-CM

## 2016-10-10 DIAGNOSIS — Z888 Allergy status to other drugs, medicaments and biological substances status: Secondary | ICD-10-CM

## 2016-10-10 DIAGNOSIS — Z79899 Other long term (current) drug therapy: Secondary | ICD-10-CM

## 2016-10-10 DIAGNOSIS — Z90710 Acquired absence of both cervix and uterus: Secondary | ICD-10-CM

## 2016-10-10 MED ORDER — FLUOXETINE HCL 20 MG PO CAPS: 20.0000 mg | ORAL_CAPSULE | Freq: Every day | ORAL | 3 refills | Status: DC

## 2016-10-10 MED ORDER — OLANZAPINE 10 MG PO TABS: 10.0000 mg | ORAL_TABLET | Freq: Every day | ORAL | 3 refills | Status: DC

## 2016-10-10 MED ORDER — BENZTROPINE MESYLATE 0.5 MG PO TABS: 0.5000 mg | ORAL_TABLET | Freq: Every day | ORAL | 3 refills | Status: DC

## 2016-10-10 NOTE — Progress Notes (Signed)
Patient ID: MARGRETTE ZARZECKI, female   DOB: 1956-02-01, 61 y.o.   MRN: [id number] Patient ID: KHANH FELICIA, female   DOB: 08/02/1956, 61 y.o.   MRN: [id number] Patient ID: NIEMAH DEARMAN, female   DOB: [birth year], 61 y.o.   MRN: [id number] Patient ID: SARALYNN TORSIELLO, female   DOB: July 23, 1956, 61 y.o.   MRN: [id number] Patient ID: CHARLESE ASHBURN, female   DOB: 1955/12/25, 61 y.o.   MRN: [id number] Patient ID: KHALISE BIZZLE, female   DOB: 03/21/1956, 61 y.o.   MRN: [id number] Patient ID: BRITTNEY CASTILLEJO, female   DOB: [birth year], 61 y.o.   MRN: [id number] Patient ID: TAYLEA LATHON, female   DOB: 12/20/1955, 61 y.o.   MRN: [id number] Patient ID: ANNMARGARET CARUSONE, female   DOB: 1956/01/21, 61 y.o.   MRN: [id number] Patient ID: WISDOM MCNAMAR, female   DOB: Dec 08, 1955, 61 y.o.   MRN: [id number] Patient ID: JALEISHA BALOGA, female   DOB: July 08, 1956, 61 y.o.   MRN: [id number] [facility] [id number] Progress Note MALAINA LESCANO MRN: [id number] DOB: [birth year] Age: 61 y.o.  Date: 10/10/2016 Start Time: 10:20 AM End Time: 10:45 AM  Chief Complaint: Chief Complaint  Patient presents with  . Depression  . Anxiety  . Follow-up   Subjective: I'm Not sleeping well  This patient is a 61 year old married white female who lives in [place] with her husband they have no children. 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  The patient states she's had a history of mental illness dating back to age 24. At that time she was hospitalized for severe depression with suicidal ideation. She's had approximately 5 more hospitalizations since then. At times she's been extremely depressed and had been cutting herself 4 years ago prior to her last hospitalization. She's had bad reactions to Haldol and Depakote but is currently doing well on her current regimen.  She's sleeping well, her energy is good and her mood is good. She is enjoying all for activities.  She's had no thoughts whatsoever of self-harm. She's not had any side effects from Zyprexa. Her primary doctors following her labs and her blood sugar has been normal  The patient returns after 4 weeks. She is doing very well and her mood has been stable. She is not sad as she had been in the past or ruminating about all the deaths in the family. She sleeping well and is enjoying taking care of her 47-year-old nephew as well as her dog Zyprexa 10 mg at bedtime  Prozac 20 mg daily Cogentin 0.5 mg at bedtime    Past psychiatric history Patient has significant history of psychiatric illness. She has at least 6 psychiatric admission with 2 suicidal attempt. She had history of noncompliant with medication resulting in decompensation. She has history of mania and severe depression. In the past she had tried Seroquel, Cymbalta, lithium, Elavil, Depakote and Haldol. She has been stable on a small dose of olanzapine Prozac and Cogentin.  Allergies: Allergies  Allergen Reactions  . Depakote [Divalproex Sodium] Other (See Comments)    Tremors so bad that she scalded herself twice with coffee.  . Lithium Nausea And Vomiting  . Reglan [Metoclopramide] Other (See Comments)    Mouth dystonia/distortion  . Codeine Nausea And Vomiting  . Latex    Medical History: Past Medical History:  Diagnosis Date  . Arthritis   . Bipolar disorder ([place])   .  Chronic kidney disease   . Depression   . GERD (gastroesophageal reflux disease)   . History of UTI   . Obesity   Patient see Dr. Manuella Ghazi in [place]. Patient is obese with a history of GERD, UTI, and arthritis. She had shoulder surgery and recently hernia repair.  She is expected to have her left kidney removed next week.  Surgical History: Past Surgical History:  Procedure Laterality Date  . ABDOMINAL HYSTERECTOMY    . APPENDECTOMY    . COLONOSCOPY N/A 08/24/2016   Procedure: COLONOSCOPY;  Surgeon: Danie Binder, MD;  [facility];  Service:  Endoscopy;  Laterality: N/A;  11:30 AM  . HERNIA REPAIR    . Left Kidney and ureter removal  08/18/2012  . POLYPECTOMY  08/24/2016   Procedure: POLYPECTOMY;  Surgeon: Danie Binder, MD;  Location: [facility];  Service: Endoscopy;;  sigmoid  colon,  ascending colon polyp, transverse colon polyp  . SHOULDER SURGERY     Family History: family history includes Alcohol abuse in her brother and father; Anxiety disorder in her brother; Bipolar disorder in her maternal aunt, maternal grandmother, and mother; Dementia in her father; Depression in her brother, father, and sister; Drug abuse in her brother; Seizures in her sister. Patient has multiple family member who has psychiatric illness. Her brother has been a patient in this office however now he sees psychiatrist at [facility].  She also has a family history of diabetes.  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 She denies any illegal drug use.  Psychosocial history Patient lives with her husband. She is currently not working. She has no children. She has good support system through her family. She is working as Psychologist, occupational at [employer].  Vitals: BP 127/67 (BP Location: Right Arm, Patient Position: Sitting, Cuff Size: Large)   Pulse 80   Ht 5\' 7"  (1.702 m)   Wt 210 lb 9.6 oz (95.5 kg)   BMI 32.98 kg/m  Lost 2 pounds since last visit.  Mental status examination Patient is well-groomed and well-dressed. She is cooperative and pleasant and maintained good eye contact. Her speech is soft and clear.  Her thought process is logical and goal-directed.  She described her mood as good and her affect is bright  She denies any active or passive suicidal thinking and homicidal thinking. She denies any auditory or visual hallucination. She denies any paranoid delusions or thinking. Her attention and concentration is fair. Her thought process is logical linear and  goal-directed. She's alert and oriented x3. Her insight judgment and impulse control is okay.  Lab Results:  No results found for this or any previous visit (from the past 8736 hour(s)).   Assessment Axis I Bipolar disorder NOS Axis II deferred Axis III see medical history Axis IV mild to moderate Axis V 80  Plan: I took her vitals.  I reviewed CC, tobacco/med/surg Hx, meds effects/ side effects, problem list, therapies and responses as well as current situation/symptoms discussed options. Continue Prozac for depression, Zyprexa for mood stabilization and Cogentin to prevent side effects from Zyprexa. She will return in 3 months See orders and pt instructions for more details. MEDICATIONS this encounter: Meds ordered this encounter  Medications  . benztropine (COGENTIN) 0.5 MG tablet    Sig: Take 1 tablet (0.5 mg total) by mouth daily.    Dispense:  30 tablet  Refill:  3  . FLUoxetine (PROZAC) 20 MG capsule    Sig: Take 1 capsule (20 mg total) by mouth daily.    Dispense:  30 capsule    Refill:  3  . OLANZapine (ZYPREXA) 10 MG tablet    Sig: Take 1 tablet (10 mg total) by mouth at bedtime.    Dispense:  30 tablet    Refill:  3   Medical Decision Making Problem Points:  Established problem, stable/improving (1), Review of last therapy session (1) and Review of psycho-social stressors (1) Data Points:  Review or order clinical lab tests (1) Review of medication regiment & side effects (2) and Review of other somatic treatments recommended (1)  I certify that outpatient services furnished can reasonably be expected to improve the patient's condition.   Levonne Spiller, MDPatient ID: RAICHEL COOLER, female   DOB: 04/12/1956, 61 y.o.   MRN: [id number]

## 2017-02-06 ENCOUNTER — Encounter (HOSPITAL_COMMUNITY): Payer: Self-pay | Admitting: Psychiatry

## 2017-02-06 ENCOUNTER — Ambulatory Visit (INDEPENDENT_AMBULATORY_CARE_PROVIDER_SITE_OTHER): Payer: BLUE CROSS/BLUE SHIELD | Admitting: Psychiatry

## 2017-02-06 VITALS — BP 118/80 | HR 74 | Wt 208.2 lb

## 2017-02-06 DIAGNOSIS — Z811 Family history of alcohol abuse and dependence: Secondary | ICD-10-CM

## 2017-02-06 DIAGNOSIS — Z888 Allergy status to other drugs, medicaments and biological substances status: Secondary | ICD-10-CM | POA: Diagnosis not present

## 2017-02-06 DIAGNOSIS — Z79899 Other long term (current) drug therapy: Secondary | ICD-10-CM | POA: Diagnosis not present

## 2017-02-06 DIAGNOSIS — Z91040 Latex allergy status: Secondary | ICD-10-CM

## 2017-02-06 DIAGNOSIS — Z818 Family history of other mental and behavioral disorders: Secondary | ICD-10-CM

## 2017-02-06 DIAGNOSIS — F319 Bipolar disorder, unspecified: Secondary | ICD-10-CM | POA: Diagnosis not present

## 2017-02-06 DIAGNOSIS — Z885 Allergy status to narcotic agent status: Secondary | ICD-10-CM

## 2017-02-06 MED ORDER — OLANZAPINE 10 MG PO TABS: 10.0000 mg | ORAL_TABLET | Freq: Every day | ORAL | 3 refills | Status: DC

## 2017-02-06 MED ORDER — FLUOXETINE HCL 20 MG PO CAPS: 20.0000 mg | ORAL_CAPSULE | Freq: Every day | ORAL | 3 refills | Status: DC

## 2017-02-06 MED ORDER — BENZTROPINE MESYLATE 0.5 MG PO TABS: 0.5000 mg | ORAL_TABLET | Freq: Every day | ORAL | 3 refills | Status: DC

## 2017-02-06 NOTE — Progress Notes (Signed)
Patient ID: Amanda Lara, female   DOB: 1956/04/06, 61 y.o.   MRN: [id number] Patient ID: Amanda Lara, female   DOB: 02-May-1956, 61 y.o.   MRN: [id number] Patient ID: Amanda Lara, female   DOB: 12-Dec-1955, 61 y.o.   MRN: [id number] Patient ID: Amanda Lara, female   DOB: 11-19-1955, 61 y.o.   MRN: [id number] Patient ID: Amanda Lara, female   DOB: 02-06-1956, 61 y.o.   MRN: [id number] Patient ID: Amanda Lara, female   DOB: November 25, 1955, 61 y.o.   MRN: [id number] Patient ID: Amanda Lara, female   DOB: May 22, 1956, 61 y.o.   MRN: [id number] Patient ID: Amanda Lara, female   DOB: 10-Jan-1956, 61 y.o.   MRN: [id number] Patient ID: Amanda Lara, female   DOB: February 08, 1956, 61 y.o.   MRN: [id number] Patient ID: Amanda Lara, female   DOB: 05-31-1956, 61 y.o.   MRN: [id number] Patient ID: Amanda Lara, female   DOB: December 18, 1955, 61 y.o.   MRN: [id number] [facility] [address] Progress Note Amanda Lara MRN: [id number] DOB: [birth year] Age: 61 y.o.  Date: 02/06/2017 Start Time: 10:20 AM End Time: 10:45 AM  Chief Complaint: Chief Complaint  Patient presents with  . Follow-up  . Medication Refill  . Anxiety  . Depression   Subjective: I'm doing well  This patient is a 61 year old married white female who lives in [place] with her husband they have no children. 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  The patient states she's had a history of mental illness dating back to age 80. At that time she was hospitalized for severe depression with suicidal ideation. She's had approximately 5 more hospitalizations since then. At times she's been extremely depressed and had been cutting herself 4 years ago prior to her last hospitalization. She's had bad reactions to Haldol and Depakote but is currently doing well on her current regimen.  She's sleeping well, her energy is good and her mood is good. She is enjoying all  for activities. She's had no thoughts whatsoever of self-harm. She's not had any side effects from Zyprexa. Her primary doctors following her labs and her blood sugar has been normal  The patient returns after 4 months She is doing very well and her mood has been stable. Her health has been good She sleeping well and is enjoying taking care of her 62-year-old nephew  Zyprexa 10 mg at bedtime  Prozac 20 mg daily Cogentin 0.5 mg at bedtime    Past psychiatric history Patient has significant history of psychiatric illness. She has at least 6 psychiatric admission with 2 suicidal attempt. She had history of noncompliant with medication resulting in decompensation. She has history of mania and severe depression. In the past she had tried Seroquel, Cymbalta, lithium, Elavil, Depakote and Haldol. She has been stable on a small dose of olanzapine Prozac and Cogentin.  Allergies: Allergies  Allergen Reactions  . Depakote [Divalproex Sodium] Other (See Comments)    Tremors so bad that she scalded herself twice with coffee.  . Lithium Nausea And Vomiting  . Reglan [Metoclopramide] Other (See Comments)    Mouth dystonia/distortion  . Codeine Nausea And Vomiting  . Latex    Medical History: Past Medical History:  Diagnosis Date  . Arthritis   . Bipolar disorder ([place])   . Chronic kidney disease   . Depression   . GERD (gastroesophageal reflux disease)   .  History of UTI   . Obesity   Patient see Dr. Manuella Ghazi in [place]. Patient is obese with a history of GERD, UTI, and arthritis. She had shoulder surgery and recently hernia repair.  She is expected to have her left kidney removed next week.  Surgical History: Past Surgical History:  Procedure Laterality Date  . ABDOMINAL HYSTERECTOMY    . APPENDECTOMY    . COLONOSCOPY N/A 08/24/2016   Procedure: COLONOSCOPY;  Surgeon: Danie Binder, MD;  [facility];  Service: Endoscopy;  Laterality: N/A;  11:30 AM  . HERNIA REPAIR    . Left Kidney  and ureter removal  08/18/2012  . POLYPECTOMY  08/24/2016   Procedure: POLYPECTOMY;  Surgeon: Danie Binder, MD;  [facility];  Service: Endoscopy;;  sigmoid  colon,  ascending colon polyp, transverse colon polyp  . SHOULDER SURGERY     Family History: family history includes Alcohol abuse in her brother and father; Anxiety disorder in her brother; Bipolar disorder in her maternal aunt, maternal grandmother, and mother; Dementia in her father; Depression in her brother, father, and sister; Drug abuse in her brother; Seizures in her sister. Patient has multiple family member who has psychiatric illness.  She also has a family history of diabetes.  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 She denies any illegal drug use.  Psychosocial history Patient lives with her husband. She is currently not working. She has no children. She has good support system through her family. She is working as Psychologist, occupational at [employer].  Vitals: BP 118/80 (BP Location: Right Arm, Patient Position: Sitting, Cuff Size: Normal)   Pulse 74   Wt 208 lb 3.2 oz (94.4 kg)   SpO2 93%   BMI 32.61 kg/m  Lost 2 pounds since last visit.  Mental status examination Patient is well-groomed and well-dressed. She is cooperative and pleasant and maintained good eye contact. Her speech is soft and clear.  Her thought process is logical and goal-directed.  She described her mood as good and her affect is bright  She denies any active or passive suicidal thinking and homicidal thinking. She denies any auditory or visual hallucination. She denies any paranoid delusions or thinking. Her attention and concentration is fair. Her thought process is logical linear and goal-directed. She's alert and oriented x3. Her insight judgment and impulse control is okay.  Lab Results:  No results found for this or any previous visit (from the past  8736 hour(s)).   Assessment Axis I Bipolar disorder NOS Axis II deferred Axis III see medical history Axis IV mild to moderate Axis V 80  Plan: I took her vitals.  I reviewed CC, tobacco/med/surg Hx, meds effects/ side effects, problem list, therapies and responses as well as current situation/symptoms discussed options. Continue Prozac for depression, Zyprexa for mood stabilization and Cogentin to prevent side effects from Zyprexa. She will return in 4 months See orders and pt instructions for more details. MEDICATIONS this encounter: Meds ordered this encounter  Medications  . OLANZapine (ZYPREXA) 10 MG tablet    Sig: Take 1 tablet (10 mg total) by mouth at bedtime.    Dispense:  30 tablet    Refill:  3  . FLUoxetine (PROZAC) 20 MG capsule    Sig: Take 1 capsule (20 mg total) by mouth daily.    Dispense:  30 capsule    Refill:  3  .  benztropine (COGENTIN) 0.5 MG tablet    Sig: Take 1 tablet (0.5 mg total) by mouth daily.    Dispense:  30 tablet    Refill:  3   Medical Decision Making Problem Points:  Established problem, stable/improving (1), Review of last therapy session (1) and Review of psycho-social stressors (1) Data Points:  Review or order clinical lab tests (1) Review of medication regiment & side effects (2) and Review of other somatic treatments recommended (1)  I certify that outpatient services furnished can reasonably be expected to improve the patient's condition.   Levonne Spiller, MDPatient ID: Amanda Lara, female   DOB: [birth year], 61 y.o.   MRN: [id number]

## 2017-02-18 ENCOUNTER — Other Ambulatory Visit (HOSPITAL_COMMUNITY): Payer: Self-pay | Admitting: Psychiatry

## 2017-03-15 ENCOUNTER — Other Ambulatory Visit (HOSPITAL_COMMUNITY): Payer: Self-pay | Admitting: Psychiatry

## 2017-03-15 DIAGNOSIS — F319 Bipolar disorder, unspecified: Secondary | ICD-10-CM

## 2017-06-12 ENCOUNTER — Ambulatory Visit (HOSPITAL_COMMUNITY): Payer: Self-pay | Admitting: Psychiatry

## 2017-06-13 ENCOUNTER — Other Ambulatory Visit (HOSPITAL_COMMUNITY): Payer: Self-pay | Admitting: Psychiatry

## 2017-06-26 ENCOUNTER — Ambulatory Visit (INDEPENDENT_AMBULATORY_CARE_PROVIDER_SITE_OTHER): Payer: BLUE CROSS/BLUE SHIELD | Admitting: Psychiatry

## 2017-06-26 ENCOUNTER — Encounter (HOSPITAL_COMMUNITY): Payer: Self-pay | Admitting: Psychiatry

## 2017-06-26 VITALS — BP 130/80 | HR 75 | Ht 67.0 in | Wt 206.0 lb

## 2017-06-26 DIAGNOSIS — F319 Bipolar disorder, unspecified: Secondary | ICD-10-CM

## 2017-06-26 DIAGNOSIS — Z813 Family history of other psychoactive substance abuse and dependence: Secondary | ICD-10-CM

## 2017-06-26 DIAGNOSIS — Z818 Family history of other mental and behavioral disorders: Secondary | ICD-10-CM | POA: Diagnosis not present

## 2017-06-26 DIAGNOSIS — Z810 Family history of intellectual disabilities: Secondary | ICD-10-CM | POA: Diagnosis not present

## 2017-06-26 DIAGNOSIS — Z811 Family history of alcohol abuse and dependence: Secondary | ICD-10-CM | POA: Diagnosis not present

## 2017-06-26 MED ORDER — OLANZAPINE 10 MG PO TABS: 10.0000 mg | ORAL_TABLET | Freq: Every day | ORAL | 3 refills | Status: DC

## 2017-06-26 MED ORDER — FLUOXETINE HCL 20 MG PO CAPS: 20.0000 mg | ORAL_CAPSULE | Freq: Every day | ORAL | 3 refills | Status: DC

## 2017-06-26 MED ORDER — BENZTROPINE MESYLATE 0.5 MG PO TABS: 0.5000 mg | ORAL_TABLET | Freq: Every day | ORAL | 3 refills | Status: DC

## 2017-06-26 NOTE — Progress Notes (Signed)
BH MD/PA/NP OP Progress Note  06/26/2017 1:53 PM Amanda Lara  MRN:  [id number]  Chief Complaint:  Chief Complaint    Depression; Anxiety; Follow-up     HPI: This patient is a 61 year old married white female who lives in [place] with her husband they have no children. 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  The patient states she's had a history of mental illness dating back to age 64. At that time she was hospitalized for severe depression with suicidal ideation. She's had approximately 5 more hospitalizations since then. At times she's been extremely depressed and had been cutting herself 4 years ago prior to her last hospitalization. She's had bad reactions to Haldol and Depakote but is currently doing well on her current regimen.  She's sleeping well, her energy is good and her mood is good. She is enjoying all for activities. She's had no thoughts whatsoever of self-harm. She's not had any side effects from Zyprexa. Her primary doctors following her labs and her blood sugar has been normal  Returns after 4 months.  She is enjoying taking care of her 64-year-old great nephew.  She still cleans a few houses.  She is staying busy and active.  She misses her brother and sister who are deceased but she is keeping a positive attitude.  She is sleeping well and denies any suicidal ideation or depression.  Her affect is very bright and happy today. Visit Diagnosis:    ICD-10-CM   1. Bipolar 1 disorder (HCC) F31.9 FLUoxetine (PROZAC) 20 MG capsule    benztropine (COGENTIN) 0.5 MG tablet    Past Psychiatric History: Psychiatric hospitalizations for depression in her younger years however for the last several years she has been followed in outpatient therapy  Past Medical History:  Past Medical History:  Diagnosis Date  . Arthritis   . Bipolar disorder ([place])   . Chronic kidney disease   . Depression   . GERD  (gastroesophageal reflux disease)   . History of UTI   . Obesity     Past Surgical History:  Procedure Laterality Date  . ABDOMINAL HYSTERECTOMY    . APPENDECTOMY    . COLONOSCOPY N/A 08/24/2016   Procedure: COLONOSCOPY;  Surgeon: Danie Binder, MD;  [facility];  Service: Endoscopy;  Laterality: N/A;  11:30 AM  . HERNIA REPAIR    . Left Kidney and ureter removal  08/18/2012  . POLYPECTOMY  08/24/2016   Procedure: POLYPECTOMY;  Surgeon: Danie Binder, MD;  [facility];  Service: Endoscopy;;  sigmoid  colon,  ascending colon polyp, transverse colon polyp  . SHOULDER SURGERY      Family Psychiatric History: See below  Family History:  Family History  Problem Relation Age of Onset  . Dementia Father   . Depression Father   . Alcohol abuse Father   . Depression Brother   . Drug abuse Brother   . Alcohol abuse Brother   . Anxiety disorder Brother   . Depression Sister   . Seizures Sister   . Bipolar disorder Mother   . Bipolar disorder Maternal Aunt   . Bipolar disorder Maternal Grandmother   . ADD / ADHD Neg Hx   . OCD Neg Hx   . Paranoid behavior Neg Hx   . Schizophrenia Neg Hx   . Sexual abuse Neg Hx   . Physical abuse Neg Hx     Social History:  Social History   Social History  . Marital status: Married    Spouse name: N/A  . Number of children: N/A  . Years of education: N/A   Social History Main Topics  . Smoking status: Never Smoker  . Smokeless tobacco: Never Used  . Alcohol use No  . Drug use: No  . Sexual activity: Not Currently   Other Topics Concern  . None   Social History Narrative  . None    Allergies:  Allergies  Allergen Reactions  . Depakote [Divalproex Sodium] Other (See Comments)    Tremors so bad that she scalded herself twice with coffee.  . Lithium Nausea And Vomiting  . Reglan [Metoclopramide] Other (See Comments)    Mouth dystonia/distortion  . Codeine Nausea And Vomiting  . Latex     Metabolic  Disorder Labs: Lab Results  Component Value Date   HGBA1C 5.7 (H) 10/11/2011   MPG 117 (H) 10/11/2011   No results found for: PROLACTIN Lab Results  Component Value Date   CHOL 246 (H) 11/03/2012   TRIG 112 11/03/2012   HDL 53 11/03/2012   CHOLHDL 4.6 11/03/2012   VLDL 22 11/03/2012   LDLCALC 171 (H) 11/03/2012   No results found for: TSH  Therapeutic Level Labs: No results found for: LITHIUM Lab Results  Component Value Date   VALPROATE 78.4 05/25/2009   VALPROATE 63.3 05/17/2009   No components found for:  CBMZ  Current Medications: Current Outpatient Prescriptions  Medication Sig Dispense Refill  . benztropine (COGENTIN) 0.5 MG tablet Take 1 tablet (0.5 mg total) by mouth daily. 30 tablet 3  . FLUoxetine (PROZAC) 20 MG capsule Take 1 capsule (20 mg total) by mouth daily. 30 capsule 3  . naproxen sodium (ANAPROX) 220 MG tablet Take 440 mg by mouth 2 (two) times daily as needed (for pain.).     Marland Kitchen OLANZapine (ZYPREXA) 10 MG tablet Take 1 tablet (10 mg total) by mouth at bedtime. 30 tablet 3  . Omega-3 Fatty Acids (FISH OIL) 1200 MG CAPS Take 1,200 mg by mouth daily.    . pantoprazole (PROTONIX) 40 MG tablet Take 40 mg by mouth daily.       No current facility-administered medications for this visit.      Musculoskeletal: Strength & Muscle Tone: within normal limits Gait & Station: normal Patient leans: N/A  Psychiatric Specialty Exam: Review of Systems  All other systems reviewed and are negative.   Blood pressure 130/80, pulse 75, height 5\' 7"  (1.702 m), weight 206 lb (93.4 kg).Body mass index is 32.26 kg/m.  General Appearance: Neat and Well Groomed  Eye Contact:  Good  Speech:  Clear and Coherent  Volume:  Normal  Mood:  Euthymic  Affect:  Appropriate  Thought Process:  Goal Directed  Orientation:  Full (Time, Place, and Person)  Thought Content: WDL   Suicidal Thoughts:  No  Homicidal Thoughts:  No  Memory:  Immediate;   Good Recent;   Good Remote;    Good  Judgement:  Good  Insight:  Good  Psychomotor Activity:  Normal  Concentration:  Concentration: Good and Attention Span: Good  Recall:  Good  Fund of Knowledge: Good  Language: Good  Akathisia:  No  Handed:  Right  AIMS (if indicated): not done  Assets:  Communication Skills Desire for Improvement Physical Health Resilience Social Support Talents/Skills  ADL's:  Intact  Cognition: WNL  Sleep:  Good   Screenings:   Assessment and Plan: Patient is a  61 year old white female with a history of depression with psychotic features.  She is currently doing well on a combination of Prozac and Zyprexa for mood stabilization and Cogentin for side effects from Zyprexa.  She has not had any current symptoms of psychosis or depression.  She will continue her current regimen and return to see me in  4 months   Levonne Spiller, MD 06/26/2017, 1:53 PM

## 2017-10-23 ENCOUNTER — Encounter (HOSPITAL_COMMUNITY): Payer: Self-pay | Admitting: Psychiatry

## 2017-10-23 ENCOUNTER — Ambulatory Visit (HOSPITAL_COMMUNITY): Payer: BLUE CROSS/BLUE SHIELD | Admitting: Psychiatry

## 2017-10-23 DIAGNOSIS — F319 Bipolar disorder, unspecified: Secondary | ICD-10-CM | POA: Diagnosis not present

## 2017-10-23 DIAGNOSIS — Z810 Family history of intellectual disabilities: Secondary | ICD-10-CM | POA: Diagnosis not present

## 2017-10-23 DIAGNOSIS — Z811 Family history of alcohol abuse and dependence: Secondary | ICD-10-CM | POA: Diagnosis not present

## 2017-10-23 DIAGNOSIS — Z813 Family history of other psychoactive substance abuse and dependence: Secondary | ICD-10-CM

## 2017-10-23 DIAGNOSIS — Z818 Family history of other mental and behavioral disorders: Secondary | ICD-10-CM | POA: Diagnosis not present

## 2017-10-23 MED ORDER — OLANZAPINE 10 MG PO TABS: 10.0000 mg | ORAL_TABLET | Freq: Every day | ORAL | 3 refills | Status: DC

## 2017-10-23 MED ORDER — BENZTROPINE MESYLATE 0.5 MG PO TABS: 0.5000 mg | ORAL_TABLET | Freq: Every day | ORAL | 3 refills | Status: DC

## 2017-10-23 MED ORDER — FLUOXETINE HCL 20 MG PO CAPS: 20.0000 mg | ORAL_CAPSULE | Freq: Every day | ORAL | 3 refills | Status: DC

## 2017-10-23 NOTE — Progress Notes (Signed)
[place] MD/PA/NP OP Progress Note  10/23/2017 1:47 PM Amanda Lara  MRN:  [id number]  Chief Complaint:  Chief Complaint    Depression; Anxiety; Follow-up     HPI: This patient is a 62 year old married white female who lives in [place] with her husband they have no children. 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  The patient states she's had a history of mental illness dating back to age 65. At that time she was hospitalized for severe depression with suicidal ideation. She's had approximately 5 more hospitalizations since then. At times she's been extremely depressed and had been cutting herself 4 years ago prior to her last hospitalization. She's had bad reactions to Haldol and Depakote but is currently doing well on her current regimen.  She's sleeping well, her energy is good and her mood is good. She is enjoying all for activities. She's had no thoughts whatsoever of self-harm. She's not had any side effects from Zyprexa. Her primary doctors following her labs and her blood sugar has been normal  Patient returns after 4 months.  She is doing extremely well.  She is staying busy taking care of her great nephew and cleaning houses.  She is sleeping well her energy is good and she denies any depression symptoms or anxiety.  She has had no thoughts of self-harm.  Her affect is quite bright today Visit Diagnosis:    ICD-10-CM   1. Bipolar 1 disorder (HCC) F31.9 FLUoxetine (PROZAC) 20 MG capsule    benztropine (COGENTIN) 0.5 MG tablet    Past Psychiatric History: Psychiatric hospitalizations for depression in her younger years.  Recently she has been followed in outpatient treatment  Past Medical History:  Past Medical History:  Diagnosis Date  . Arthritis   . Bipolar disorder ([place])   . Chronic kidney disease   . Depression   . GERD (gastroesophageal reflux disease)   . History of UTI   . Obesity     Past Surgical  History:  Procedure Laterality Date  . ABDOMINAL HYSTERECTOMY    . APPENDECTOMY    . COLONOSCOPY N/A 08/24/2016   Procedure: COLONOSCOPY;  Surgeon: Danie Binder, MD;  [facility];  Service: Endoscopy;  Laterality: N/A;  11:30 AM  . HERNIA REPAIR    . Left Kidney and ureter removal  08/18/2012  . POLYPECTOMY  08/24/2016   Procedure: POLYPECTOMY;  Surgeon: Danie Binder, MD;  [facility];  Service: Endoscopy;;  sigmoid  colon,  ascending colon polyp, transverse colon polyp  . SHOULDER SURGERY      Family Psychiatric History: See below  Family History:  Family History  Problem Relation Age of Onset  . Dementia Father   . Depression Father   . Alcohol abuse Father   . Depression Brother   . Drug abuse Brother   . Alcohol abuse Brother   . Anxiety disorder Brother   . Depression Sister   . Seizures Sister   . Bipolar disorder Mother   . Bipolar disorder Maternal Aunt   . Bipolar disorder Maternal Grandmother   . ADD / ADHD Neg Hx   . OCD Neg Hx   . Paranoid behavior Neg Hx   . Schizophrenia Neg Hx   . Sexual abuse Neg Hx   . Physical abuse Neg Hx     Social History:  Social History   Socioeconomic History  . Marital status: Married  Spouse name: None  . Number of children: None  . Years of education: None  . Highest education level: None  Social Needs  . Financial resource strain: None  . Food insecurity - worry: None  . Food insecurity - inability: None  . Transportation needs - medical: None  . Transportation needs - non-medical: None  Occupational History  . None  Tobacco Use  . Smoking status: Never Smoker  . Smokeless tobacco: Never Used  Substance and Sexual Activity  . Alcohol use: No  . Drug use: No  . Sexual activity: Not Currently  Other Topics Concern  . None  Social History Narrative  . None    Allergies:  Allergies  Allergen Reactions  . Depakote [Divalproex Sodium] Other (See Comments)    Tremors so bad that  she scalded herself twice with coffee.  . Lithium Nausea And Vomiting  . Reglan [Metoclopramide] Other (See Comments)    Mouth dystonia/distortion  . Codeine Nausea And Vomiting  . Latex     Metabolic Disorder Labs: Lab Results  Component Value Date   HGBA1C 5.7 (H) 10/11/2011   MPG 117 (H) 10/11/2011   No results found for: PROLACTIN Lab Results  Component Value Date   CHOL 246 (H) 11/03/2012   TRIG 112 11/03/2012   HDL 53 11/03/2012   CHOLHDL 4.6 11/03/2012   VLDL 22 11/03/2012   LDLCALC 171 (H) 11/03/2012   No results found for: TSH  Therapeutic Level Labs: No results found for: LITHIUM Lab Results  Component Value Date   VALPROATE 78.4 05/25/2009   VALPROATE 63.3 05/17/2009   No components found for:  CBMZ  Current Medications: Current Outpatient Medications  Medication Sig Dispense Refill  . benztropine (COGENTIN) 0.5 MG tablet Take 1 tablet (0.5 mg total) by mouth daily. 30 tablet 3  . FLUoxetine (PROZAC) 20 MG capsule Take 1 capsule (20 mg total) by mouth daily. 30 capsule 3  . naproxen sodium (ANAPROX) 220 MG tablet Take 440 mg by mouth 2 (two) times daily as needed (for pain.).     Marland Kitchen OLANZapine (ZYPREXA) 10 MG tablet Take 1 tablet (10 mg total) by mouth at bedtime. 30 tablet 3  . Omega-3 Fatty Acids (FISH OIL) 1200 MG CAPS Take 1,200 mg by mouth daily.    . pantoprazole (PROTONIX) 40 MG tablet Take 40 mg by mouth daily.       No current facility-administered medications for this visit.      Musculoskeletal: Strength & Muscle Tone: within normal limits Gait & Station: normal Patient leans: N/A  Psychiatric Specialty Exam: Review of Systems  All other systems reviewed and are negative.   Blood pressure (!) 102/58, pulse 84, height 5\' 7"  (1.702 m), weight 204 lb (92.5 kg), SpO2 96 %.Body mass index is 31.95 kg/m.  General Appearance: Casual and Fairly Groomed  Eye Contact:  Good  Speech:  Clear and Coherent  Volume:  Normal  Mood:  Euthymic   Affect:  Congruent  Thought Process:  Goal Directed  Orientation:  Full (Time, Place, and Person)  Thought Content: WDL   Suicidal Thoughts:  No  Homicidal Thoughts:  No  Memory:  Immediate;   Good Recent;   Good Remote;   Good  Judgement:  Fair  Insight:  Fair  Psychomotor Activity:  Normal  Concentration:  Concentration: Good and Attention Span: Good  Recall:  Good  Fund of Knowledge: Good  Language: Good  Akathisia:  No  Handed:  Right  AIMS (if  indicated): not done  Assets:  Communication Skills Desire for Improvement Physical Health Resilience Social Support Talents/Skills  ADL's:  Intact  Cognition: WNL  Sleep:  Good   Screenings:   Assessment and Plan: This patient is a 62 year old female with a history of bipolar disorder primarily depressed.  She seems to be doing well on her current regimen.  She will continue Prozac 20 mg daily for depression, olanzapine 10 mg daily for mood stabilization and Cogentin 0.5 mg daily for side effects from olanzapine.  She will continue these medications and return to see me in 4 months   Levonne Spiller, MD 10/23/2017, 1:47 PM

## 2018-02-12 ENCOUNTER — Encounter (HOSPITAL_COMMUNITY): Payer: Self-pay | Admitting: Psychiatry

## 2018-02-12 ENCOUNTER — Ambulatory Visit (HOSPITAL_COMMUNITY): Payer: BLUE CROSS/BLUE SHIELD | Admitting: Psychiatry

## 2018-02-12 DIAGNOSIS — F319 Bipolar disorder, unspecified: Secondary | ICD-10-CM

## 2018-02-12 MED ORDER — OLANZAPINE 10 MG PO TABS: 10.0000 mg | ORAL_TABLET | Freq: Every day | ORAL | 3 refills | Status: DC

## 2018-02-12 MED ORDER — FLUOXETINE HCL 20 MG PO CAPS: 20.0000 mg | ORAL_CAPSULE | Freq: Every day | ORAL | 3 refills | Status: DC

## 2018-02-12 MED ORDER — BENZTROPINE MESYLATE 0.5 MG PO TABS: 0.5000 mg | ORAL_TABLET | Freq: Every day | ORAL | 3 refills | Status: DC

## 2018-02-12 NOTE — Progress Notes (Signed)
[facility] MD/PA/NP OP Progress Note  02/12/2018 2:33 PM Amanda Lara  MRN:  [id number]  Chief Complaint:  Chief Complaint    Depression; Anxiety; Follow-up     HPI: This patient is a 62 year old married white female who lives in [place] with her husband they have no children. 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  The patient states she's had a history of mental illness dating back to age 55. At that time she was hospitalized for severe depression with suicidal ideation. She's had approximately 5 more hospitalizations since then. At times she's been extremely depressed and had been cutting herself 4 years ago prior to her last hospitalization. She's had bad reactions to Haldol and Depakote but is currently doing well on her current regimen.  She's sleeping well, her energy is good and her mood is good. She is enjoying all for activities. She's had no thoughts whatsoever of self-harm. She's not had any side effects from Zyprexa. Her primary doctors following her labs and her blood sugar has been normal  The patient returns after 4 months.  She continues to do quite well.  She is staying busy taking care of her 56-year-old great nephew and cleaning houses.  She states that she thinks her nephew may have ADHD because he "never stops moving."  She is sleeping well her energy is good and her mood is quite good.  She has no thoughts of self-harm or suicide.  She is sleeping well   Visit Diagnosis:    ICD-10-CM   1. Bipolar 1 disorder (HCC) F31.9 FLUoxetine (PROZAC) 20 MG capsule    benztropine (COGENTIN) 0.5 MG tablet    Past Psychiatric History: Psychiatric hospitalizations for depression in her younger years.  Recently she has been followed in outpatient treatment  Past Medical History:  Past Medical History:  Diagnosis Date  . Arthritis   . Bipolar disorder ([place])   . Chronic kidney disease   . Depression   . GERD  (gastroesophageal reflux disease)   . History of UTI   . Obesity     Past Surgical History:  Procedure Laterality Date  . ABDOMINAL HYSTERECTOMY    . APPENDECTOMY    . COLONOSCOPY N/A 08/24/2016   Procedure: COLONOSCOPY;  Surgeon: Danie Binder, MD;  [facility];  Service: Endoscopy;  Laterality: N/A;  11:30 AM  . HERNIA REPAIR    . Left Kidney and ureter removal  08/18/2012  . POLYPECTOMY  08/24/2016   Procedure: POLYPECTOMY;  Surgeon: Danie Binder, MD;  [facility];  Service: Endoscopy;;  sigmoid  colon,  ascending colon polyp, transverse colon polyp  . SHOULDER SURGERY      Family Psychiatric History: See below  Family History:  Family History  Problem Relation Age of Onset  . Dementia Father   . Depression Father   . Alcohol abuse Father   . Depression Brother   . Drug abuse Brother   . Alcohol abuse Brother   . Anxiety disorder Brother   . Depression Sister   . Seizures Sister   . Bipolar disorder Mother   . Bipolar disorder Maternal Aunt   . Bipolar disorder Maternal Grandmother   . ADD / ADHD Neg Hx   . OCD Neg Hx   . Paranoid behavior Neg Hx   . Schizophrenia Neg Hx   . Sexual abuse Neg Hx   . Physical abuse Neg Hx  Social History:  Social History   Socioeconomic History  . Marital status: Married    Spouse name: Not on file  . Number of children: Not on file  . Years of education: Not on file  . Highest education level: Not on file  Occupational History  . Not on file  Social Needs  . Financial resource strain: Not on file  . Food insecurity:    Worry: Not on file    Inability: Not on file  . Transportation needs:    Medical: Not on file    Non-medical: Not on file  Tobacco Use  . Smoking status: Never Smoker  . Smokeless tobacco: Never Used  Substance and Sexual Activity  . Alcohol use: No  . Drug use: No  . Sexual activity: Not Currently  Lifestyle  . Physical activity:    Days per week: Not on file     Minutes per session: Not on file  . Stress: Not on file  Relationships  . Social connections:    Talks on phone: Not on file    Gets together: Not on file    Attends religious service: Not on file    Active member of club or organization: Not on file    Attends meetings of clubs or organizations: Not on file    Relationship status: Not on file  Other Topics Concern  . Not on file  Social History Narrative  . Not on file    Allergies:  Allergies  Allergen Reactions  . Depakote [Divalproex Sodium] Other (See Comments)    Tremors so bad that she scalded herself twice with coffee.  . Lithium Nausea And Vomiting  . Reglan [Metoclopramide] Other (See Comments)    Mouth dystonia/distortion  . Codeine Nausea And Vomiting  . Latex     Metabolic Disorder Labs: Lab Results  Component Value Date   HGBA1C 5.7 (H) 10/11/2011   MPG 117 (H) 10/11/2011   No results found for: PROLACTIN Lab Results  Component Value Date   CHOL 246 (H) 11/03/2012   TRIG 112 11/03/2012   HDL 53 11/03/2012   CHOLHDL 4.6 11/03/2012   VLDL 22 11/03/2012   LDLCALC 171 (H) 11/03/2012   No results found for: TSH  Therapeutic Level Labs: No results found for: LITHIUM Lab Results  Component Value Date   VALPROATE 78.4 05/25/2009   VALPROATE 63.3 05/17/2009   No components found for:  CBMZ  Current Medications: Current Outpatient Medications  Medication Sig Dispense Refill  . benztropine (COGENTIN) 0.5 MG tablet Take 1 tablet (0.5 mg total) by mouth daily. 30 tablet 3  . FLUoxetine (PROZAC) 20 MG capsule Take 1 capsule (20 mg total) by mouth daily. 30 capsule 3  . naproxen sodium (ANAPROX) 220 MG tablet Take 440 mg by mouth 2 (two) times daily as needed (for pain.).     Marland Kitchen OLANZapine (ZYPREXA) 10 MG tablet Take 1 tablet (10 mg total) by mouth at bedtime. 30 tablet 3  . Omega-3 Fatty Acids (FISH OIL) 1200 MG CAPS Take 1,200 mg by mouth daily.    . pantoprazole (PROTONIX) 40 MG tablet Take 40 mg by  mouth daily.       No current facility-administered medications for this visit.      Musculoskeletal: Strength & Muscle Tone: within normal limits Gait & Station: normal Patient leans: N/A  Psychiatric Specialty Exam: Review of Systems  All other systems reviewed and are negative.   Blood pressure 114/75, pulse 75, height 5\' 7"  (  1.702 m), weight 197 lb (89.4 kg), SpO2 95 %.Body mass index is 30.85 kg/m.  General Appearance: Casual, Neat and Well Groomed  Eye Contact:  Good  Speech:  Clear and Coherent  Volume:  Normal  Mood:  Euthymic  Affect:  Congruent  Thought Process:  Goal Directed  Orientation:  Full (Time, Place, and Person)  Thought Content: WDL   Suicidal Thoughts:  No  Homicidal Thoughts:  No  Memory:  Immediate;   Good Recent;   Good Remote;   Good  Judgement:  Good  Insight:  Good  Psychomotor Activity:  Normal  Concentration:  Concentration: Good and Attention Span: Good  Recall:  Good  Fund of Knowledge: Good  Language: Good  Akathisia:  No  Handed:  Right  AIMS (if indicated): not done  Assets:  Communication Skills Desire for Improvement Physical Health Resilience Social Support Talents/Skills  ADL's:  Intact  Cognition: WNL  Sleep:  Good   Screenings:   Assessment and Plan: This patient is a 62 year old female with a history of depression and psychosis.  She is doing very well with no current symptoms.  She will continue Prozac 20 mg daily for depression, olanzapine 10 mg daily at bedtime for mood stabilization and Cogentin 0.5 mg daily to prevent side effects from olanzapine.  She will return to see me in 4 months   Levonne Spiller, MD 02/12/2018, 2:33 PM

## 2018-02-19 ENCOUNTER — Ambulatory Visit (HOSPITAL_COMMUNITY): Payer: Self-pay | Admitting: Psychiatry

## 2018-06-16 ENCOUNTER — Ambulatory Visit (HOSPITAL_COMMUNITY): Payer: Self-pay | Admitting: Psychiatry

## 2018-06-17 ENCOUNTER — Ambulatory Visit (INDEPENDENT_AMBULATORY_CARE_PROVIDER_SITE_OTHER): Payer: BLUE CROSS/BLUE SHIELD | Admitting: Psychiatry

## 2018-06-17 ENCOUNTER — Encounter (HOSPITAL_COMMUNITY): Payer: Self-pay | Admitting: Psychiatry

## 2018-06-17 DIAGNOSIS — F319 Bipolar disorder, unspecified: Secondary | ICD-10-CM | POA: Diagnosis not present

## 2018-06-17 MED ORDER — OLANZAPINE 10 MG PO TABS: 10.0000 mg | ORAL_TABLET | Freq: Every day | ORAL | 3 refills | Status: DC

## 2018-06-17 MED ORDER — BENZTROPINE MESYLATE 0.5 MG PO TABS: 0.5000 mg | ORAL_TABLET | Freq: Every day | ORAL | 3 refills | Status: DC

## 2018-06-17 MED ORDER — FLUOXETINE HCL 20 MG PO CAPS: 20.0000 mg | ORAL_CAPSULE | Freq: Every day | ORAL | 3 refills | Status: DC

## 2018-06-17 NOTE — Progress Notes (Signed)
[facility] MD/PA/NP OP Progress Note  06/17/2018 3:13 PM Amanda Lara  MRN:  [id number]  Chief Complaint:  Chief Complaint    Depression; Anxiety; Follow-up     HPI: This patient is a 62 year old married white female who lives in [place] with her husband they have no children. 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  The patient states she's had a history of mental illness dating back to age 89. At that time she was hospitalized for severe depression with suicidal ideation. She's had approximately 5 more hospitalizations since then. At times she's been extremely depressed and had been cutting herself 4 years ago prior to her last hospitalization. She's had bad reactions to Haldol and Depakote but is currently doing well on her current regimen.  She's sleeping well, her energy is good and her mood is good. She is enjoying all for activities. She's had no thoughts whatsoever of self-harm. She's not had any side effects from Zyprexa. Her primary doctors following her labs and her blood sugar has been normal  The patient returns for follow-up after 4 months.  She states that she has had a good summer.  She still spends a lot of time taking care of her 16-year-old great nephew.  The child and his mother live next door to the patient and her husband.  She states that the child's mother, who is her cousin is often working or gone out of town with her boyfriend.  She feels like she and her husband has been designated as the primary custodians.  She loves a little boy but it is very tiring.  She states that her mood is generally good she is sleeping well and she denies any symptoms of depression or suicidal ideation. Visit Diagnosis:    ICD-10-CM   1. Bipolar 1 disorder (HCC) F31.9 FLUoxetine (PROZAC) 20 MG capsule    benztropine (COGENTIN) 0.5 MG tablet    Past Psychiatric History: Psychiatric hospitalizations for depression in her younger  years.  Recently she has been followed in outpatient treatment  Past Medical History:  Past Medical History:  Diagnosis Date  . Arthritis   . Bipolar disorder ([place])   . Chronic kidney disease   . Depression   . GERD (gastroesophageal reflux disease)   . History of UTI   . Obesity     Past Surgical History:  Procedure Laterality Date  . ABDOMINAL HYSTERECTOMY    . APPENDECTOMY    . COLONOSCOPY N/A 08/24/2016   Procedure: COLONOSCOPY;  Surgeon: Danie Binder, MD;  [facility];  Service: Endoscopy;  Laterality: N/A;  11:30 AM  . HERNIA REPAIR    . Left Kidney and ureter removal  08/18/2012  . POLYPECTOMY  08/24/2016   Procedure: POLYPECTOMY;  Surgeon: Danie Binder, MD;  [facility];  Service: Endoscopy;;  sigmoid  colon,  ascending colon polyp, transverse colon polyp  . SHOULDER SURGERY      Family Psychiatric History: See below  Family History:  Family History  Problem Relation Age of Onset  . Dementia Father   . Depression Father   . Alcohol abuse Father   . Depression Brother   . Drug abuse Brother   . Alcohol abuse Brother   . Anxiety disorder Brother   . Depression Sister   . Seizures Sister   . Bipolar disorder Mother   . Bipolar disorder Maternal Aunt   . Bipolar disorder Maternal  Grandmother   . ADD / ADHD Neg Hx   . OCD Neg Hx   . Paranoid behavior Neg Hx   . Schizophrenia Neg Hx   . Sexual abuse Neg Hx   . Physical abuse Neg Hx     Social History:  Social History   Socioeconomic History  . Marital status: Married    Spouse name: Not on file  . Number of children: Not on file  . Years of education: Not on file  . Highest education level: Not on file  Occupational History  . Not on file  Social Needs  . Financial resource strain: Not on file  . Food insecurity:    Worry: Not on file    Inability: Not on file  . Transportation needs:    Medical: Not on file    Non-medical: Not on file  Tobacco Use  . Smoking status:  Never Smoker  . Smokeless tobacco: Never Used  Substance and Sexual Activity  . Alcohol use: No  . Drug use: No  . Sexual activity: Not Currently  Lifestyle  . Physical activity:    Days per week: Not on file    Minutes per session: Not on file  . Stress: Not on file  Relationships  . Social connections:    Talks on phone: Not on file    Gets together: Not on file    Attends religious service: Not on file    Active member of club or organization: Not on file    Attends meetings of clubs or organizations: Not on file    Relationship status: Not on file  Other Topics Concern  . Not on file  Social History Narrative  . Not on file    Allergies:  Allergies  Allergen Reactions  . Depakote [Divalproex Sodium] Other (See Comments)    Tremors so bad that she scalded herself twice with coffee.  . Lithium Nausea And Vomiting  . Reglan [Metoclopramide] Other (See Comments)    Mouth dystonia/distortion  . Codeine Nausea And Vomiting  . Latex     Metabolic Disorder Labs: Lab Results  Component Value Date   HGBA1C 5.7 (H) 10/11/2011   MPG 117 (H) 10/11/2011   No results found for: PROLACTIN Lab Results  Component Value Date   CHOL 246 (H) 11/03/2012   TRIG 112 11/03/2012   HDL 53 11/03/2012   CHOLHDL 4.6 11/03/2012   VLDL 22 11/03/2012   LDLCALC 171 (H) 11/03/2012   No results found for: TSH  Therapeutic Level Labs: No results found for: LITHIUM Lab Results  Component Value Date   VALPROATE 78.4 05/25/2009   VALPROATE 63.3 05/17/2009   No components found for:  CBMZ  Current Medications: Current Outpatient Medications  Medication Sig Dispense Refill  . benztropine (COGENTIN) 0.5 MG tablet Take 1 tablet (0.5 mg total) by mouth daily. 30 tablet 3  . FLUoxetine (PROZAC) 20 MG capsule Take 1 capsule (20 mg total) by mouth daily. 30 capsule 3  . naproxen sodium (ANAPROX) 220 MG tablet Take 440 mg by mouth 2 (two) times daily as needed (for pain.).     Marland Kitchen OLANZapine  (ZYPREXA) 10 MG tablet Take 1 tablet (10 mg total) by mouth at bedtime. 30 tablet 3  . Omega-3 Fatty Acids (FISH OIL) 1200 MG CAPS Take 1,200 mg by mouth daily.    . pantoprazole (PROTONIX) 40 MG tablet Take 40 mg by mouth daily.       No current facility-administered medications for this  visit.      Musculoskeletal: Strength & Muscle Tone: within normal limits Gait & Station: normal Patient leans: N/A  Psychiatric Specialty Exam: Review of Systems  All other systems reviewed and are negative.   Blood pressure 110/74, pulse 79, height 5\' 7"  (1.702 m), weight 201 lb (91.2 kg), SpO2 96 %.Body mass index is 31.48 kg/m.  General Appearance: Casual and Fairly Groomed  Eye Contact:  Good  Speech:  Clear and Coherent  Volume:  Normal  Mood:  Euthymic  Affect:  Congruent  Thought Process:  Goal Directed  Orientation:  Full (Time, Place, and Person)  Thought Content: WDL   Suicidal Thoughts:  No  Homicidal Thoughts:  No  Memory:  Immediate;   Good Recent;   Good Remote;   Good  Judgement:  Good  Insight:  Good  Psychomotor Activity:  Normal  Concentration:  Concentration: Good and Attention Span: Good  Recall:  Good  Fund of Knowledge: Good  Language: Good  Akathisia:  No  Handed:  Right  AIMS (if indicated): not done  Assets:  Communication Skills Desire for Improvement Physical Health Resilience Social Support Talents/Skills  ADL's:  Intact  Cognition: WNL  Sleep:  Good   Screenings:   Assessment and Plan: This patient is a 62 year old female with a history of depression.  She is doing well on Prozac 20 mg daily for depression, olanzapine 10 mg at bedtime for augmentation and Cogentin 0.5 mg to prevent side effects from olanzapine.  She will return to see me in 4 months   Levonne Spiller, MD 06/17/2018, 3:13 PM

## 2018-10-13 ENCOUNTER — Ambulatory Visit (HOSPITAL_COMMUNITY): Payer: PRIVATE HEALTH INSURANCE | Admitting: Psychiatry

## 2018-10-13 ENCOUNTER — Encounter (HOSPITAL_COMMUNITY): Payer: Self-pay | Admitting: Psychiatry

## 2018-10-13 DIAGNOSIS — F319 Bipolar disorder, unspecified: Secondary | ICD-10-CM | POA: Diagnosis not present

## 2018-10-13 MED ORDER — BENZTROPINE MESYLATE 0.5 MG PO TABS: 0.5000 mg | ORAL_TABLET | Freq: Every day | ORAL | 3 refills | Status: DC

## 2018-10-13 MED ORDER — FLUOXETINE HCL 20 MG PO CAPS: 20.0000 mg | ORAL_CAPSULE | Freq: Every day | ORAL | 3 refills | Status: DC

## 2018-10-13 MED ORDER — OLANZAPINE 10 MG PO TABS: 10.0000 mg | ORAL_TABLET | Freq: Every day | ORAL | 3 refills | Status: DC

## 2018-10-13 NOTE — Progress Notes (Signed)
BH MD/PA/NP OP Progress Note  10/13/2018 4:24 PM Amanda Lara  MRN:  [id number]  Chief Complaint:  Chief Complaint    Anxiety; Depression; Follow-up     HPI: This patient is a 63 year old married white female who lives in [place] with her husband they have no children. 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  The patient states she's had a history of mental illness dating back to age 63. At that time she was hospitalized for severe depression with suicidal ideation. She's had approximately 5 more hospitalizations since then. At times she's been extremely depressed and had been cutting herself 4 years ago prior to her last hospitalization. She's had bad reactions to Haldol and Depakote but is currently doing well on her current regimen.  She's sleeping well, her energy is good and her mood is good. She is enjoying all for activities. She's had no thoughts whatsoever of self-harm. She's not had any side effects from Zyprexa. Her primary doctors following her labs and her blood sugar has been normal  The patient returns after 4 months.  She states overall she is doing well.  She is still taking care of the 30-year-old boy who is the son of her husband's cousin.  This girl is now pregnant again.  She does not seem to be taking responsibility for her children.  The patient got pneumonia the last couple of months because the boy is bringing home a lot of germs from school.  I explained to the patient that as she gets older she is not obligated to take care of these young children and needs to talk to their mother about taking responsibility.  Overall however her mood is good she is sleeping well she is not had any manic symptoms or depression. Visit Diagnosis:    ICD-10-CM   1. Bipolar 1 disorder (HCC) F31.9 benztropine (COGENTIN) 0.5 MG tablet    FLUoxetine (PROZAC) 20 MG capsule    Past Psychiatric History: Psychiatric  hospitalizations for depression in her younger years.  Recently she has been followed in outpatient treatment  Past Medical History:  Past Medical History:  Diagnosis Date  . Arthritis   . Bipolar disorder ([place])   . Chronic kidney disease   . Depression   . GERD (gastroesophageal reflux disease)   . History of UTI   . Obesity     Past Surgical History:  Procedure Laterality Date  . ABDOMINAL HYSTERECTOMY    . APPENDECTOMY    . COLONOSCOPY N/A 08/24/2016   Procedure: COLONOSCOPY;  Surgeon: Danie Binder, MD;  [facility];  Service: Endoscopy;  Laterality: N/A;  11:30 AM  . HERNIA REPAIR    . Left Kidney and ureter removal  08/18/2012  . POLYPECTOMY  08/24/2016   Procedure: POLYPECTOMY;  Surgeon: Danie Binder, MD;  Location: [facility];  Service: Endoscopy;;  sigmoid  colon,  ascending colon polyp, transverse colon polyp  . SHOULDER SURGERY      Family Psychiatric History: See below  Family History:  Family History  Problem Relation Age of Onset  . Dementia Father   . Depression Father   . Alcohol abuse Father   . Depression Brother   . Drug abuse Brother   . Alcohol abuse Brother   . Anxiety disorder Brother   . Depression Sister   . Seizures Sister   . Bipolar disorder Mother   . Bipolar disorder Maternal Aunt   .  Bipolar disorder Maternal Grandmother   . ADD / ADHD Neg Hx   . OCD Neg Hx   . Paranoid behavior Neg Hx   . Schizophrenia Neg Hx   . Sexual abuse Neg Hx   . Physical abuse Neg Hx     Social History:  Social History   Socioeconomic History  . Marital status: Married    Spouse name: Not on file  . Number of children: Not on file  . Years of education: Not on file  . Highest education level: Not on file  Occupational History  . Not on file  Social Needs  . Financial resource strain: Not on file  . Food insecurity:    Worry: Not on file    Inability: Not on file  . Transportation needs:    Medical: Not on file     Non-medical: Not on file  Tobacco Use  . Smoking status: Never Smoker  . Smokeless tobacco: Never Used  Substance and Sexual Activity  . Alcohol use: No  . Drug use: No  . Sexual activity: Not Currently  Lifestyle  . Physical activity:    Days per week: Not on file    Minutes per session: Not on file  . Stress: Not on file  Relationships  . Social connections:    Talks on phone: Not on file    Gets together: Not on file    Attends religious service: Not on file    Active member of club or organization: Not on file    Attends meetings of clubs or organizations: Not on file    Relationship status: Not on file  Other Topics Concern  . Not on file  Social History Narrative  . Not on file    Allergies:  Allergies  Allergen Reactions  . Depakote [Divalproex Sodium] Other (See Comments)    Tremors so bad that she scalded herself twice with coffee.  . Lithium Nausea And Vomiting  . Reglan [Metoclopramide] Other (See Comments)    Mouth dystonia/distortion  . Codeine Nausea And Vomiting  . Latex     Metabolic Disorder Labs: Lab Results  Component Value Date   HGBA1C 5.7 (H) 10/11/2011   MPG 117 (H) 10/11/2011   No results found for: PROLACTIN Lab Results  Component Value Date   CHOL 246 (H) 11/03/2012   TRIG 112 11/03/2012   HDL 53 11/03/2012   CHOLHDL 4.6 11/03/2012   VLDL 22 11/03/2012   LDLCALC 171 (H) 11/03/2012   No results found for: TSH  Therapeutic Level Labs: No results found for: LITHIUM Lab Results  Component Value Date   VALPROATE 78.4 05/25/2009   VALPROATE 63.3 05/17/2009   No components found for:  CBMZ  Current Medications: Current Outpatient Medications  Medication Sig Dispense Refill  . benztropine (COGENTIN) 0.5 MG tablet Take 1 tablet (0.5 mg total) by mouth daily. 30 tablet 3  . FLUoxetine (PROZAC) 20 MG capsule Take 1 capsule (20 mg total) by mouth daily. 30 capsule 3  . naproxen sodium (ANAPROX) 220 MG tablet Take 440 mg by mouth 2  (two) times daily as needed (for pain.).     Marland Kitchen OLANZapine (ZYPREXA) 10 MG tablet Take 1 tablet (10 mg total) by mouth at bedtime. 30 tablet 3  . Omega-3 Fatty Acids (FISH OIL) 1200 MG CAPS Take 1,200 mg by mouth daily.    . pantoprazole (PROTONIX) 40 MG tablet Take 40 mg by mouth daily.       No current facility-administered  medications for this visit.      Musculoskeletal: Strength & Muscle Tone: within normal limits Gait & Station: normal Patient leans: N/A  Psychiatric Specialty Exam: Review of Systems  All other systems reviewed and are negative.   Blood pressure (!) 147/83, pulse 97, height 5\' 7"  (1.702 m), weight 197 lb (89.4 kg), SpO2 96 %.Body mass index is 30.85 kg/m.  General Appearance: Casual, Neat and Well Groomed  Eye Contact:  Good  Speech:  Clear and Coherent  Volume:  Normal  Mood:  Euthymic  Affect:  Appropriate and Congruent  Thought Process:  Goal Directed  Orientation:  Full (Time, Place, and Person)  Thought Content: WDL   Suicidal Thoughts:  No  Homicidal Thoughts:  No  Memory:  Immediate;   Good Recent;   Good Remote;   Good  Judgement:  Good  Insight:  Fair  Psychomotor Activity:  Normal  Concentration:  Concentration: Good and Attention Span: Good  Recall:  Good  Fund of Knowledge: Good  Language: Good  Akathisia:  No  Handed:  Right  AIMS (if indicated): not done  Assets:  Communication Skills Desire for Improvement Physical Health Resilience Social Support Talents/Skills  ADL's:  Intact  Cognition: WNL  Sleep:  Good   Screenings:   Assessment and Plan:  This patient is a 63 year old female with a history of bipolar disorder.  She is doing very well on her current regimen.  She will continue Prozac 20 mg daily for depression, Zyprexa 10 mg daily for mood stabilization and Cogentin 0.5 mg daily to prevent side effects from Zyprexa.  She will return to see me in 4 months  Levonne Spiller, MD 10/13/2018, 4:24 PM

## 2018-11-05 ENCOUNTER — Other Ambulatory Visit (HOSPITAL_COMMUNITY): Payer: Self-pay | Admitting: Psychiatry

## 2018-11-05 DIAGNOSIS — F319 Bipolar disorder, unspecified: Secondary | ICD-10-CM

## 2019-02-11 ENCOUNTER — Encounter (HOSPITAL_COMMUNITY): Payer: Self-pay | Admitting: Psychiatry

## 2019-02-11 ENCOUNTER — Ambulatory Visit (INDEPENDENT_AMBULATORY_CARE_PROVIDER_SITE_OTHER): Payer: PRIVATE HEALTH INSURANCE | Admitting: Psychiatry

## 2019-02-11 ENCOUNTER — Other Ambulatory Visit: Payer: Self-pay

## 2019-02-11 DIAGNOSIS — F319 Bipolar disorder, unspecified: Secondary | ICD-10-CM

## 2019-02-11 MED ORDER — BENZTROPINE MESYLATE 0.5 MG PO TABS: 0.5000 mg | ORAL_TABLET | Freq: Every day | ORAL | 3 refills | Status: DC

## 2019-02-11 MED ORDER — OLANZAPINE 10 MG PO TABS: 10.0000 mg | ORAL_TABLET | Freq: Every day | ORAL | 3 refills | Status: DC

## 2019-02-11 MED ORDER — FLUOXETINE HCL 20 MG PO CAPS: 20.0000 mg | ORAL_CAPSULE | Freq: Every day | ORAL | 3 refills | Status: DC

## 2019-02-11 NOTE — Progress Notes (Signed)
Virtual Visit via Telephone Note  I connected with Amanda Lara on 02/11/19 at  1:40 PM EDT by telephone and verified that I am speaking with the correct person using two identifiers.   I discussed the limitations, risks, security and privacy concerns of performing an evaluation and management service by telephone and the availability of in person appointments. I also discussed with the patient that there may be a patient responsible charge related to this service. The patient expressed understanding and agreed to proceed.     I discussed the assessment and treatment plan with the patient. The patient was provided an opportunity to ask questions and all were answered. The patient agreed with the plan and demonstrated an understanding of the instructions.   The patient was advised to call back or seek an in-person evaluation if the symptoms worsen or if the condition fails to improve as anticipated.  I provided 15 minutes of non-face-to-face time during this encounter.   Levonne Spiller, MD  [facility] MD/PA/NP OP Progress Note  02/11/2019 1:58 PM Amanda Lara  MRN:  [id number]  Chief Complaint:  Chief Complaint    Depression; Anxiety; Follow-up     HPI: This patient is a 63 year old married white female who lives in [place] with her husband they have no children. 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  The patient states she's had a history of mental illness dating back to age 63. At that time she was hospitalized for severe depression with suicidal ideation. She's had approximately 5 more hospitalizations since then. At times she's been extremely depressed and had been cutting herself 4 years ago prior to her last hospitalization. She's had bad reactions to Haldol and Depakote but is currently doing well on her current regimen.  She's sleeping well, her energy is good and her mood is good. She is enjoying all for  activities. She's had no thoughts whatsoever of self-harm. She's not had any side effects from Zyprexa. Her primary doctors following her labs and her blood sugar has been normal  The patient returns after 4 months and is assessed via telephone due to the coronavirus pandemic.  She states that overall she is doing well and her health is good.  Unfortunately her brother-in-law died from COVID-64.  He was 20 years old and lived in a assisted care facility.  She continues to watch her 4-year-old great nephew.  She stays busy cleaning.  She denies any symptoms of depression mania or severe anxiety.  She is sleeping well.  She denies any thoughts of self-harm or suicidal ideation Visit Diagnosis:    ICD-10-CM   1. Bipolar 1 disorder (HCC)  F31.9 FLUoxetine (PROZAC) 20 MG capsule    benztropine (COGENTIN) 0.5 MG tablet    Past Psychiatric History: Several hospitalizations in her younger years  Past Medical History:  Past Medical History:  Diagnosis Date  . Arthritis   . Bipolar disorder ([place])   . Chronic kidney disease   . Depression   . GERD (gastroesophageal reflux disease)   . History of UTI   . Obesity     Past Surgical History:  Procedure Laterality Date  . ABDOMINAL HYSTERECTOMY    . APPENDECTOMY    . COLONOSCOPY N/A 08/24/2016   Procedure: COLONOSCOPY;  Surgeon: Danie Binder, MD;  [facility];  Service: Endoscopy;  Laterality: N/A;  11:30 AM  . HERNIA REPAIR    . Left Kidney  and ureter removal  08/18/2012  . POLYPECTOMY  08/24/2016   Procedure: POLYPECTOMY;  Surgeon: Danie Binder, MD;  [facility];  Service: Endoscopy;;  sigmoid  colon,  ascending colon polyp, transverse colon polyp  . SHOULDER SURGERY      Family Psychiatric History: see below  Family History:  Family History  Problem Relation Age of Onset  . Dementia Father   . Depression Father   . Alcohol abuse Father   . Depression Brother   . Drug abuse Brother   . Alcohol abuse Brother    . Anxiety disorder Brother   . Depression Sister   . Seizures Sister   . Bipolar disorder Mother   . Bipolar disorder Maternal Aunt   . Bipolar disorder Maternal Grandmother   . ADD / ADHD Neg Hx   . OCD Neg Hx   . Paranoid behavior Neg Hx   . Schizophrenia Neg Hx   . Sexual abuse Neg Hx   . Physical abuse Neg Hx     Social History:  Social History   Socioeconomic History  . Marital status: Married    Spouse name: Not on file  . Number of children: Not on file  . Years of education: Not on file  . Highest education level: Not on file  Occupational History  . Not on file  Social Needs  . Financial resource strain: Not on file  . Food insecurity    Worry: Not on file    Inability: Not on file  . Transportation needs    Medical: Not on file    Non-medical: Not on file  Tobacco Use  . Smoking status: Never Smoker  . Smokeless tobacco: Never Used  Substance and Sexual Activity  . Alcohol use: No  . Drug use: No  . Sexual activity: Not Currently  Lifestyle  . Physical activity    Days per week: Not on file    Minutes per session: Not on file  . Stress: Not on file  Relationships  . Social Herbalist on phone: Not on file    Gets together: Not on file    Attends religious service: Not on file    Active member of club or organization: Not on file    Attends meetings of clubs or organizations: Not on file    Relationship status: Not on file  Other Topics Concern  . Not on file  Social History Narrative  . Not on file    Allergies:  Allergies  Allergen Reactions  . Depakote [Divalproex Sodium] Other (See Comments)    Tremors so bad that she scalded herself twice with coffee.  . Lithium Nausea And Vomiting  . Reglan [Metoclopramide] Other (See Comments)    Mouth dystonia/distortion  . Codeine Nausea And Vomiting  . Latex     Metabolic Disorder Labs: Lab Results  Component Value Date   HGBA1C 5.7 (H) 10/11/2011   MPG 117 (H) 10/11/2011   No  results found for: PROLACTIN Lab Results  Component Value Date   CHOL 246 (H) 11/03/2012   TRIG 112 11/03/2012   HDL 53 11/03/2012   CHOLHDL 4.6 11/03/2012   VLDL 22 11/03/2012   LDLCALC 171 (H) 11/03/2012   No results found for: TSH  Therapeutic Level Labs: No results found for: LITHIUM Lab Results  Component Value Date   VALPROATE 78.4 05/25/2009   VALPROATE 63.3 05/17/2009   No components found for:  CBMZ  Current Medications: Current  Outpatient Medications  Medication Sig Dispense Refill  . benztropine (COGENTIN) 0.5 MG tablet Take 1 tablet (0.5 mg total) by mouth daily. 30 tablet 3  . FLUoxetine (PROZAC) 20 MG capsule Take 1 capsule (20 mg total) by mouth daily. 30 capsule 3  . naproxen sodium (ANAPROX) 220 MG tablet Take 440 mg by mouth 2 (two) times daily as needed (for pain.).     Marland Kitchen OLANZapine (ZYPREXA) 10 MG tablet Take 1 tablet (10 mg total) by mouth at bedtime. 30 tablet 3  . Omega-3 Fatty Acids (FISH OIL) 1200 MG CAPS Take 1,200 mg by mouth daily.    . pantoprazole (PROTONIX) 40 MG tablet Take 40 mg by mouth daily.       No current facility-administered medications for this visit.      Musculoskeletal: Strength & Muscle Tone: within normal limits Gait & Station: normal Patient leans: N/A  Psychiatric Specialty Exam: Review of Systems  All other systems reviewed and are negative.   There were no vitals taken for this visit.There is no height or weight on file to calculate BMI.  General Appearance: NA  Eye Contact:  NA  Speech:  Clear and Coherent  Volume:  Normal  Mood:  Euthymic  Affect:  NA  Thought Process:  Goal Directed  Orientation:  Full (Time, Place, and Person)  Thought Content: WDL   Suicidal Thoughts:  No  Homicidal Thoughts:  No  Memory:  Immediate;   Good Recent;   Good Remote;   Good  Judgement:  Good  Insight:  Good  Psychomotor Activity:  Normal  Concentration:  Concentration: Good and Attention Span: Good  Recall:  Good  Fund  of Knowledge: Good  Language: Good  Akathisia:  No  Handed:  Right  AIMS (if indicated): not done  Assets:  Communication Skills Desire for Improvement Physical Health Resilience Social Support Talents/Skills  ADL's:  Intact  Cognition: WNL  Sleep:  Good   Screenings:   Assessment and Plan: This patient is a 63 year old female with a history of bipolar disorder.  She has been very stable on her current regimen.  She will continue Prozac 20 mg daily for depression, Zyprexa 10 mg daily for mood stabilization and Cogentin 0.5 mg daily to prevent side effects from olanzapine.  She will return to see me in 4 months   Levonne Spiller, MD 02/11/2019, 1:58 PM

## 2019-06-15 ENCOUNTER — Encounter (HOSPITAL_COMMUNITY): Payer: Self-pay | Admitting: Psychiatry

## 2019-06-15 ENCOUNTER — Ambulatory Visit (INDEPENDENT_AMBULATORY_CARE_PROVIDER_SITE_OTHER): Payer: PRIVATE HEALTH INSURANCE | Admitting: Psychiatry

## 2019-06-15 ENCOUNTER — Other Ambulatory Visit: Payer: Self-pay

## 2019-06-15 DIAGNOSIS — F319 Bipolar disorder, unspecified: Secondary | ICD-10-CM | POA: Diagnosis not present

## 2019-06-15 MED ORDER — FLUOXETINE HCL 20 MG PO CAPS: 20.0000 mg | ORAL_CAPSULE | Freq: Every day | ORAL | 3 refills | Status: DC

## 2019-06-15 MED ORDER — OLANZAPINE 10 MG PO TABS: 10.0000 mg | ORAL_TABLET | Freq: Every day | ORAL | 3 refills | Status: DC

## 2019-06-15 MED ORDER — BENZTROPINE MESYLATE 0.5 MG PO TABS: 0.5000 mg | ORAL_TABLET | Freq: Every day | ORAL | 3 refills | Status: DC

## 2019-06-15 NOTE — Progress Notes (Signed)
Virtual Visit via Telephone Note  I connected with Amanda Lara on 06/15/19 at  2:00 PM EDT by telephone and verified that I am speaking with the correct person using two identifiers.   I discussed the limitations, risks, security and privacy concerns of performing an evaluation and management service by telephone and the availability of in person appointments. I also discussed with the patient that there may be a patient responsible charge related to this service. The patient expressed understanding and agreed to proceed.     I discussed the assessment and treatment plan with the patient. The patient was provided an opportunity to ask questions and all were answered. The patient agreed with the plan and demonstrated an understanding of the instructions.   The patient was advised to call back or seek an in-person evaluation if the symptoms worsen or if the condition fails to improve as anticipated.  I provided 15 minutes of non-face-to-face time during this encounter.   Levonne Spiller, MD  [facility] MD/PA/NP OP Progress Note  06/15/2019 2:07 PM Amanda Lara  MRN:  [id number]  Chief Complaint:  Chief Complaint    Depression; Anxiety; Manic Behavior; Follow-up     HPI: This patient is a 63 year old married white female who lives in [place] with her husband they have no children. 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  The patient states she's had a history of mental illness dating back to age 89. At that time she was hospitalized for severe depression with suicidal ideation. She's had approximately 5 more hospitalizations since then. At times she's been extremely depressed and had been cutting herself 4 years ago prior to her last hospitalization. She's had bad reactions to Haldol and Depakote but is currently doing well on her current regimen.  She's sleeping well, her energy is good and her mood is good. She is enjoying  all for activities. She's had no thoughts whatsoever of self-harm. She's not had any side effects from Zyprexa. Her primary doctors following her labs and her blood sugar has been normal  The patient returns for follow-up after 4 months.  She states that she continues to do well.  Sometimes she has trouble getting to sleep but it does not take any longer than 30 minutes.  When she is asleep she feels that she sleeps well and is rested the next day.  Her mood has been good and she denies any depression or manic symptoms or any hallucinations.  She is enjoying all of her activities and is cleaning houses again. Visit Diagnosis:    ICD-10-CM   1. Bipolar 1 disorder (HCC)  F31.9 FLUoxetine (PROZAC) 20 MG capsule    benztropine (COGENTIN) 0.5 MG tablet    Past Psychiatric History: Several hospitalizations in her younger years  Past Medical History:  Past Medical History:  Diagnosis Date  . Arthritis   . Bipolar disorder ([place])   . Chronic kidney disease   . Depression   . GERD (gastroesophageal reflux disease)   . History of UTI   . Obesity     Past Surgical History:  Procedure Laterality Date  . ABDOMINAL HYSTERECTOMY    . APPENDECTOMY    . COLONOSCOPY N/A 08/24/2016   Procedure: COLONOSCOPY;  Surgeon: Danie Binder, MD;  [facility];  Service: Endoscopy;  Laterality: N/A;  11:30 AM  . HERNIA REPAIR    . Left Kidney and ureter removal  08/18/2012  .  POLYPECTOMY  08/24/2016   Procedure: POLYPECTOMY;  Surgeon: Danie Binder, MD;  Location: [facility];  Service: Endoscopy;;  sigmoid  colon,  ascending colon polyp, transverse colon polyp  . SHOULDER SURGERY      Family Psychiatric History: see  below  Family History:  Family History  Problem Relation Age of Onset  . Dementia Father   . Depression Father   . Alcohol abuse Father   . Depression Brother   . Drug abuse Brother   . Alcohol abuse Brother   . Anxiety disorder Brother   . Depression Sister   . Seizures  Sister   . Bipolar disorder Mother   . Bipolar disorder Maternal Aunt   . Bipolar disorder Maternal Grandmother   . ADD / ADHD Neg Hx   . OCD Neg Hx   . Paranoid behavior Neg Hx   . Schizophrenia Neg Hx   . Sexual abuse Neg Hx   . Physical abuse Neg Hx     Social History:  Social History   Socioeconomic History  . Marital status: Married    Spouse name: Not on file  . Number of children: Not on file  . Years of education: Not on file  . Highest education level: Not on file  Occupational History  . Not on file  Social Needs  . Financial resource strain: Not on file  . Food insecurity    Worry: Not on file    Inability: Not on file  . Transportation needs    Medical: Not on file    Non-medical: Not on file  Tobacco Use  . Smoking status: Never Smoker  . Smokeless tobacco: Never Used  Substance and Sexual Activity  . Alcohol use: No  . Drug use: No  . Sexual activity: Not Currently  Lifestyle  . Physical activity    Days per week: Not on file    Minutes per session: Not on file  . Stress: Not on file  Relationships  . Social Herbalist on phone: Not on file    Gets together: Not on file    Attends religious service: Not on file    Active member of club or organization: Not on file    Attends meetings of clubs or organizations: Not on file    Relationship status: Not on file  Other Topics Concern  . Not on file  Social History Narrative  . Not on file    Allergies:  Allergies  Allergen Reactions  . Depakote [Divalproex Sodium] Other (See Comments)    Tremors so bad that she scalded herself twice with coffee.  . Lithium Nausea And Vomiting  . Reglan [Metoclopramide] Other (See Comments)    Mouth dystonia/distortion  . Codeine Nausea And Vomiting  . Latex     Metabolic Disorder Labs: Lab Results  Component Value Date   HGBA1C 5.7 (H) 10/11/2011   MPG 117 (H) 10/11/2011   No results found for: PROLACTIN Lab Results  Component Value Date    CHOL 246 (H) 11/03/2012   TRIG 112 11/03/2012   HDL 53 11/03/2012   CHOLHDL 4.6 11/03/2012   VLDL 22 11/03/2012   LDLCALC 171 (H) 11/03/2012   No results found for: TSH  Therapeutic Level Labs: No results found for: LITHIUM Lab Results  Component Value Date   VALPROATE 78.4 05/25/2009   VALPROATE 63.3 05/17/2009   No components found for:  CBMZ  Current Medications: Current Outpatient Medications  Medication Sig Dispense  Refill  . benztropine (COGENTIN) 0.5 MG tablet Take 1 tablet (0.5 mg total) by mouth daily. 30 tablet 3  . FLUoxetine (PROZAC) 20 MG capsule Take 1 capsule (20 mg total) by mouth daily. 30 capsule 3  . naproxen sodium (ANAPROX) 220 MG tablet Take 440 mg by mouth 2 (two) times daily as needed (for pain.).     Marland Kitchen OLANZapine (ZYPREXA) 10 MG tablet Take 1 tablet (10 mg total) by mouth at bedtime. 30 tablet 3  . Omega-3 Fatty Acids (FISH OIL) 1200 MG CAPS Take 1,200 mg by mouth daily.    . pantoprazole (PROTONIX) 40 MG tablet Take 40 mg by mouth daily.       No current facility-administered medications for this visit.      Musculoskeletal: Strength & Muscle Tone: within normal limits Gait & Station: normal Patient leans: N/A  Psychiatric Specialty Exam: Review of Systems  All other systems reviewed and are negative.   There were no vitals taken for this visit.There is no height or weight on file to calculate BMI.  General Appearance: NA  Eye Contact:  NA  Speech:  Clear and Coherent  Volume:  Normal  Mood: Euthymic  Affect:  NA  Thought Process:  Goal Directed  Orientation:  Full (Time, Place, and Person)  Thought Content: WDL   Suicidal Thoughts:  No  Homicidal Thoughts:  No  Memory:  Immediate;   Good Recent;   Good Remote;   Good  Judgement:  Good  Insight:  Good  Psychomotor Activity:  Normal  Concentration:  Concentration: Good and Attention Span: Good  Recall:  Good  Fund of Knowledge: Good  Language: Good  Akathisia:  No  Handed:   Right  AIMS (if indicated): not done  Assets:  Communication Skills Desire for Improvement Physical Health Resilience Social Support Talents/Skills  ADL's:  Intact  Cognition: WNL  Sleep:  Good   Screenings:   Assessment and Plan:  This patient is a 63 year old female with a history of bipolar disorder.  She is stable on her current regimen.  She will continue Prozac 20 mg daily for depression, Zyprexa 10 mg daily for mood stabilization and Cogentin 0.5 mg daily to prevent side effects from olanzapine.  She will return to see me in 4 months  Levonne Spiller, MD 06/15/2019, 2:07 PM

## 2019-07-16 ENCOUNTER — Other Ambulatory Visit (HOSPITAL_COMMUNITY): Payer: Self-pay | Admitting: Psychiatry

## 2019-08-06 ENCOUNTER — Encounter: Payer: Self-pay | Admitting: Gastroenterology

## 2019-09-07 ENCOUNTER — Telehealth: Payer: Self-pay | Admitting: Gastroenterology

## 2019-09-07 NOTE — Telephone Encounter (Signed)
Patient is on the schedule tomorrow for a telephone visit to arrange a colonoscopy. We have not physically seen her in the office that I can see; she was seen at time of last colonoscopy Dec 2017.   Can we please see if this can be a doxy? This would be best (instead of telephone call) as she would like to do this virtually.

## 2019-09-08 ENCOUNTER — Other Ambulatory Visit: Payer: Self-pay

## 2019-09-08 ENCOUNTER — Ambulatory Visit: Payer: PRIVATE HEALTH INSURANCE | Admitting: Gastroenterology

## 2019-09-08 NOTE — Telephone Encounter (Signed)
Patient does not have access to cell phone, she rescheduled to next week and will be in person

## 2019-09-18 ENCOUNTER — Other Ambulatory Visit: Payer: Self-pay

## 2019-09-18 ENCOUNTER — Ambulatory Visit: Payer: PRIVATE HEALTH INSURANCE | Admitting: Gastroenterology

## 2019-10-19 ENCOUNTER — Other Ambulatory Visit: Payer: Self-pay

## 2019-10-19 ENCOUNTER — Encounter (HOSPITAL_COMMUNITY): Payer: Self-pay | Admitting: Psychiatry

## 2019-10-19 ENCOUNTER — Ambulatory Visit (INDEPENDENT_AMBULATORY_CARE_PROVIDER_SITE_OTHER): Payer: Self-pay | Admitting: Psychiatry

## 2019-10-19 DIAGNOSIS — F319 Bipolar disorder, unspecified: Secondary | ICD-10-CM

## 2019-10-19 MED ORDER — FLUOXETINE HCL 20 MG PO CAPS: 20.0000 mg | ORAL_CAPSULE | Freq: Every day | ORAL | 3 refills | Status: DC

## 2019-10-19 MED ORDER — BENZTROPINE MESYLATE 0.5 MG PO TABS: 0.5000 mg | ORAL_TABLET | Freq: Every day | ORAL | 3 refills | Status: DC

## 2019-10-19 MED ORDER — OLANZAPINE 10 MG PO TABS: 10.0000 mg | ORAL_TABLET | Freq: Every day | ORAL | 3 refills | Status: DC

## 2019-10-19 NOTE — Progress Notes (Signed)
Virtual Visit via Telephone Note  I connected with Amanda Lara on 10/19/19 at  3:00 PM EST by telephone and verified that I am speaking with the correct person using two identifiers.   I discussed the limitations, risks, security and privacy concerns of performing an evaluation and management service by telephone and the availability of in person appointments. I also discussed with the patient that there may be a patient responsible charge related to this service. The patient expressed understanding and agreed to proceed.       I discussed the assessment and treatment plan with the patient. The patient was provided an opportunity to ask questions and all were answered. The patient agreed with the plan and demonstrated an understanding of the instructions.   The patient was advised to call back or seek an in-person evaluation if the symptoms worsen or if the condition fails to improve as anticipated.  I provided 15 minutes of non-face-to-face time during this encounter.   Levonne Spiller, MD  [facility] MD/PA/NP OP Progress Note  10/19/2019 3:18 PM Amanda Lara  MRN:  [id number]  Chief Complaint:  Chief Complaint    Depression; Anxiety; Follow-up     HPI: This patient is a 64 year old married white female who lives in [place] with her husband they have no children. 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  The patient states she's had a history of mental illness dating back to age 42. At that time she was hospitalized for severe depression with suicidal ideation. She's had approximately 5 more hospitalizations since then. At times she's been extremely depressed and had been cutting herself 4 years ago prior to her last hospitalization. She's had bad reactions to Haldol and Depakote but is currently doing well on her current regimen.  She's sleeping well, her energy is good and her mood is good. She is enjoying all for  activities. She's had no thoughts whatsoever of self-harm. She's not had any side effects from Zyprexa. Her primary doctors following her labs and her blood sugar has been normal  The patient returns for follow-up after 4 months.  She states everything is going well.  Her health is good and she denies symptoms of depression mania anxiety thoughts of self-harm or suicide.  She is sleeping well although it takes her little bit of time to get to sleep.  She is staying active cleaning houses.  She is spending most of her time however at home due to the pandemic. Visit Diagnosis:    ICD-10-CM   1. Bipolar 1 disorder (HCC)  F31.9 FLUoxetine (PROZAC) 20 MG capsule    benztropine (COGENTIN) 0.5 MG tablet    Past Psychiatric History: Several hospitalizations in her younger years  Past Medical History:  Past Medical History:  Diagnosis Date  . Arthritis   . Bipolar disorder ([place])   . Chronic kidney disease   . Depression   . GERD (gastroesophageal reflux disease)   . History of UTI   . Obesity     Past Surgical History:  Procedure Laterality Date  . ABDOMINAL HYSTERECTOMY    . APPENDECTOMY    . COLONOSCOPY N/A 08/24/2016   Procedure: COLONOSCOPY;  Surgeon: Danie Binder, MD;  [facility];  Service: Endoscopy;  Laterality: N/A;  11:30 AM  . HERNIA REPAIR    . Left Kidney and ureter removal  08/18/2012  . POLYPECTOMY  08/24/2016   Procedure: POLYPECTOMY;  Surgeon:  Danie Binder, MD;  [facility];  Service: Endoscopy;;  sigmoid  colon,  ascending colon polyp, transverse colon polyp  . SHOULDER SURGERY      Family Psychiatric History: see below  Family History:  Family History  Problem Relation Age of Onset  . Dementia Father   . Depression Father   . Alcohol abuse Father   . Depression Brother   . Drug abuse Brother   . Alcohol abuse Brother   . Anxiety disorder Brother   . Depression Sister   . Seizures Sister   . Bipolar disorder Mother   . Bipolar  disorder Maternal Aunt   . Bipolar disorder Maternal Grandmother   . ADD / ADHD Neg Hx   . OCD Neg Hx   . Paranoid behavior Neg Hx   . Schizophrenia Neg Hx   . Sexual abuse Neg Hx   . Physical abuse Neg Hx     Social History:  Social History   Socioeconomic History  . Marital status: Married    Spouse name: Not on file  . Number of children: Not on file  . Years of education: Not on file  . Highest education level: Not on file  Occupational History  . Not on file  Tobacco Use  . Smoking status: Never Smoker  . Smokeless tobacco: Never Used  Substance and Sexual Activity  . Alcohol use: No  . Drug use: No  . Sexual activity: Not Currently  Other Topics Concern  . Not on file  Social History Narrative  . Not on file   Social Determinants of Health   Financial Resource Strain:   . Difficulty of Paying Living Expenses: Not on file  Food Insecurity:   . Worried About Charity fundraiser in the Last Year: Not on file  . Ran Out of Food in the Last Year: Not on file  Transportation Needs:   . Lack of Transportation (Medical): Not on file  . Lack of Transportation (Non-Medical): Not on file  Physical Activity:   . Days of Exercise per Week: Not on file  . Minutes of Exercise per Session: Not on file  Stress:   . Feeling of Stress : Not on file  Social Connections:   . Frequency of Communication with Friends and Family: Not on file  . Frequency of Social Gatherings with Friends and Family: Not on file  . Attends Religious Services: Not on file  . Active Member of Clubs or Organizations: Not on file  . Attends Archivist Meetings: Not on file  . Marital Status: Not on file    Allergies:  Allergies  Allergen Reactions  . Depakote [Divalproex Sodium] Other (See Comments)    Tremors so bad that she scalded herself twice with coffee.  . Lithium Nausea And Vomiting  . Reglan [Metoclopramide] Other (See Comments)    Mouth dystonia/distortion  . Codeine  Nausea And Vomiting  . Latex     Metabolic Disorder Labs: Lab Results  Component Value Date   HGBA1C 5.7 (H) 10/11/2011   MPG 117 (H) 10/11/2011   No results found for: PROLACTIN Lab Results  Component Value Date   CHOL 246 (H) 11/03/2012   TRIG 112 11/03/2012   HDL 53 11/03/2012   CHOLHDL 4.6 11/03/2012   VLDL 22 11/03/2012   LDLCALC 171 (H) 11/03/2012   No results found for: TSH  Therapeutic Level Labs: No results found for: LITHIUM Lab Results  Component Value Date  VALPROATE 78.4 05/25/2009   VALPROATE 63.3 05/17/2009   No components found for:  CBMZ  Current Medications: Current Outpatient Medications  Medication Sig Dispense Refill  . benztropine (COGENTIN) 0.5 MG tablet Take 1 tablet (0.5 mg total) by mouth daily. 90 tablet 3  . FLUoxetine (PROZAC) 20 MG capsule Take 1 capsule (20 mg total) by mouth daily. 90 capsule 3  . naproxen sodium (ANAPROX) 220 MG tablet Take 440 mg by mouth 2 (two) times daily as needed (for pain.).     Marland Kitchen OLANZapine (ZYPREXA) 10 MG tablet Take 1 tablet (10 mg total) by mouth at bedtime. 90 tablet 3  . Omega-3 Fatty Acids (FISH OIL) 1200 MG CAPS Take 1,200 mg by mouth daily.    . pantoprazole (PROTONIX) 40 MG tablet Take 40 mg by mouth daily.       No current facility-administered medications for this visit.     Musculoskeletal: Strength & Muscle Tone: within normal limits Gait & Station: normal Patient leans: N/A  Psychiatric Specialty Exam: Review of Systems  All other systems reviewed and are negative.   There were no vitals taken for this visit.There is no height or weight on file to calculate BMI.  General Appearance: NA  Eye Contact:  NA  Speech:  Clear and Coherent  Volume:  Normal  Mood:  Euthymic  Affect:  NA  Thought Process:  Goal Directed  Orientation:  Full (Time, Place, and Person)  Thought Content: WDL   Suicidal Thoughts:  No  Homicidal Thoughts:  No  Memory:  Immediate;   Good Recent;   Good Remote;    Good  Judgement:  Good  Insight:  Good  Psychomotor Activity:  Normal  Concentration:  Concentration: Good and Attention Span: Good  Recall:  Good  Fund of Knowledge: Good  Language: Good  Akathisia:  No  Handed:  Right  AIMS (if indicated): not done  Assets:  Communication Skills Desire for Improvement Physical Health Resilience Social Support Talents/Skills  ADL's:  Intact  Cognition: WNL  Sleep:  Good   Screenings:   Assessment and Plan: This patient is a 64 year old female with a history of bipolar disorder she is quite stable on her current regimen.  She will continue Prozac 20 mg daily for depression, Zyprexa 10 mg daily for mood stabilization and Cogentin 0.5 mg daily to prevent side effects from olanzapine.  She will return to see me in 6 months   Levonne Spiller, MD 10/19/2019, 3:18 PM

## 2020-04-19 ENCOUNTER — Telehealth (INDEPENDENT_AMBULATORY_CARE_PROVIDER_SITE_OTHER): Payer: Self-pay | Admitting: Psychiatry

## 2020-04-19 ENCOUNTER — Encounter (HOSPITAL_COMMUNITY): Payer: Self-pay | Admitting: Psychiatry

## 2020-04-19 ENCOUNTER — Other Ambulatory Visit: Payer: Self-pay

## 2020-04-19 DIAGNOSIS — F319 Bipolar disorder, unspecified: Secondary | ICD-10-CM

## 2020-04-19 MED ORDER — FLUOXETINE HCL 20 MG PO CAPS: 20.0000 mg | ORAL_CAPSULE | Freq: Every day | ORAL | 3 refills | Status: DC

## 2020-04-19 MED ORDER — BENZTROPINE MESYLATE 0.5 MG PO TABS: 0.5000 mg | ORAL_TABLET | Freq: Every day | ORAL | 3 refills | Status: DC

## 2020-04-19 MED ORDER — OLANZAPINE 10 MG PO TABS
10.0000 mg | ORAL_TABLET | Freq: Every day | ORAL | 3 refills | Status: DC
Start: 2020-04-19 — End: 2020-11-21

## 2020-04-19 NOTE — Progress Notes (Signed)
Virtual Visit via Telephone Note  I connected with Amanda Lara on 04/19/20 at  3:00 PM EDT by telephone and verified that I am speaking with the correct person using two identifiers.   I discussed the limitations, risks, security and privacy concerns of performing an evaluation and management service by telephone and the availability of in person appointments. I also discussed with the patient that there may be a patient responsible charge related to this service. The patient expressed understanding and agreed to proceed.      I discussed the assessment and treatment plan with the patient. The patient was provided an opportunity to ask questions and all were answered. The patient agreed with the plan and demonstrated an understanding of the instructions.   The patient was advised to call back or seek an in-person evaluation if the symptoms worsen or if the condition fails to improve as anticipated.  I provided 15 minutes of non-face-to-face time during this encounter. Location: Provider office, patient home  Levonne Spiller, MD  [facility] MD/PA/NP OP Progress Note  04/19/2020 3:09 PM Amanda Lara  MRN:  [id number]  Chief Complaint:  Chief Complaint    Depression; Anxiety; Manic Behavior; Follow-up     HPI:  This patient is a 64 year old married white female who lives in [place] with her husband they have no children. 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  The patient states she's had a history of mental illness dating back to age 76. At that time she was hospitalized for severe depression with suicidal ideation. She's had approximately 5 more hospitalizations since then. At times she's been extremely depressed and had been cutting herself 4 years ago prior to her last hospitalization. She's had bad reactions to Haldol and Depakote but is currently doing well on her current regimen.  She's sleeping well, her energy is  good and her mood is good. She is enjoying all for activities. She's had no thoughts whatsoever of self-harm. She's not had any side effects from Zyprexa. Her primary doctors following her labs and her blood sugar has been normal  The patient returns after 6 months.  She states nothing much is changed.  She is still staying around home quite a bit helping to babysit her husband's cousin's baby.  Her mood is generally good and she denies depression anxiety racing thoughts or other manic symptoms.  She is sleeping well and denies panic attacks. Visit Diagnosis:    ICD-10-CM   1. Bipolar 1 disorder (HCC)  F31.9 FLUoxetine (PROZAC) 20 MG capsule    benztropine (COGENTIN) 0.5 MG tablet    Past Psychiatric History: Several hospitalizations in her younger years  Past Medical History:  Past Medical History:  Diagnosis Date  . Arthritis   . Bipolar disorder ([place])   . Chronic kidney disease   . Depression   . GERD (gastroesophageal reflux disease)   . History of UTI   . Obesity     Past Surgical History:  Procedure Laterality Date  . ABDOMINAL HYSTERECTOMY    . APPENDECTOMY    . COLONOSCOPY N/A 08/24/2016   Procedure: COLONOSCOPY;  Surgeon: Danie Binder, MD;  [facility];  Service: Endoscopy;  Laterality: N/A;  11:30 AM  . HERNIA REPAIR    . Left Kidney and ureter removal  08/18/2012  . POLYPECTOMY  08/24/2016   Procedure: POLYPECTOMY;  Surgeon: Danie Binder, MD;  [facility];  Service: Endoscopy;;  sigmoid  colon,  ascending colon polyp, transverse colon polyp  . SHOULDER SURGERY      Family Psychiatric History: see below  Family History:  Family History  Problem Relation Age of Onset  . Dementia Father   . Depression Father   . Alcohol abuse Father   . Depression Brother   . Drug abuse Brother   . Alcohol abuse Brother   . Anxiety disorder Brother   . Depression Sister   . Seizures Sister   . Bipolar disorder Mother   . Bipolar disorder Maternal  Aunt   . Bipolar disorder Maternal Grandmother   . ADD / ADHD Neg Hx   . OCD Neg Hx   . Paranoid behavior Neg Hx   . Schizophrenia Neg Hx   . Sexual abuse Neg Hx   . Physical abuse Neg Hx     Social History:  Social History   Socioeconomic History  . Marital status: Married    Spouse name: Not on file  . Number of children: Not on file  . Years of education: Not on file  . Highest education level: Not on file  Occupational History  . Not on file  Tobacco Use  . Smoking status: Never Smoker  . Smokeless tobacco: Never Used  Vaping Use  . Vaping Use: Never used  Substance and Sexual Activity  . Alcohol use: No  . Drug use: No  . Sexual activity: Not Currently  Other Topics Concern  . Not on file  Social History Narrative  . Not on file   Social Determinants of Health   Financial Resource Strain:   . Difficulty of Paying Living Expenses: Not on file  Food Insecurity:   . Worried About Charity fundraiser in the Last Year: Not on file  . Ran Out of Food in the Last Year: Not on file  Transportation Needs:   . Lack of Transportation (Medical): Not on file  . Lack of Transportation (Non-Medical): Not on file  Physical Activity:   . Days of Exercise per Week: Not on file  . Minutes of Exercise per Session: Not on file  Stress:   . Feeling of Stress : Not on file  Social Connections:   . Frequency of Communication with Friends and Family: Not on file  . Frequency of Social Gatherings with Friends and Family: Not on file  . Attends Religious Services: Not on file  . Active Member of Clubs or Organizations: Not on file  . Attends Archivist Meetings: Not on file  . Marital Status: Not on file    Allergies:  Allergies  Allergen Reactions  . Depakote [Divalproex Sodium] Other (See Comments)    Tremors so bad that she scalded herself twice with coffee.  . Lithium Nausea And Vomiting  . Reglan [Metoclopramide] Other (See Comments)    Mouth  dystonia/distortion  . Codeine Nausea And Vomiting  . Latex     Metabolic Disorder Labs: Lab Results  Component Value Date   HGBA1C 5.7 (H) 10/11/2011   MPG 117 (H) 10/11/2011   No results found for: PROLACTIN Lab Results  Component Value Date   CHOL 246 (H) 11/03/2012   TRIG 112 11/03/2012   HDL 53 11/03/2012   CHOLHDL 4.6 11/03/2012   VLDL 22 11/03/2012   LDLCALC 171 (H) 11/03/2012   No results found for: TSH  Therapeutic Level Labs: No results found for: LITHIUM Lab Results  Component Value Date   VALPROATE  78.4 05/25/2009   VALPROATE 63.3 05/17/2009   No components found for:  CBMZ  Current Medications: Current Outpatient Medications  Medication Sig Dispense Refill  . benztropine (COGENTIN) 0.5 MG tablet Take 1 tablet (0.5 mg total) by mouth daily. 90 tablet 3  . FLUoxetine (PROZAC) 20 MG capsule Take 1 capsule (20 mg total) by mouth daily. 90 capsule 3  . naproxen sodium (ANAPROX) 220 MG tablet Take 440 mg by mouth 2 (two) times daily as needed (for pain.).     Marland Kitchen OLANZapine (ZYPREXA) 10 MG tablet Take 1 tablet (10 mg total) by mouth at bedtime. 90 tablet 3  . Omega-3 Fatty Acids (FISH OIL) 1200 MG CAPS Take 1,200 mg by mouth daily.    . pantoprazole (PROTONIX) 40 MG tablet Take 40 mg by mouth daily.       No current facility-administered medications for this visit.     Musculoskeletal: Strength & Muscle Tone: within normal limits Gait & Station: normal Patient leans: N/A  Psychiatric Specialty Exam: Review of Systems  All other systems reviewed and are negative.   There were no vitals taken for this visit.There is no height or weight on file to calculate BMI.  General Appearance: NA  Eye Contact:  NA  Speech:  Clear and Coherent  Volume:  Normal  Mood:  Euthymic  Affect:  NA  Thought Process:  Goal Directed  Orientation:  Full (Time, Place, and Person)  Thought Content: WDL   Suicidal Thoughts:  No  Homicidal Thoughts:  No  Memory:  Immediate;    Good Recent;   Good Remote;   Good  Judgement:  Good  Insight:  Good  Psychomotor Activity:  Normal  Concentration:  Concentration: Good and Attention Span: Good  Recall:  Good  Fund of Knowledge: Good  Language: Good  Akathisia:  No  Handed:  Right  AIMS (if indicated): not done  Assets:  Communication Skills Desire for Improvement Physical Health Resilience Social Support Talents/Skills  ADL's:  Intact  Cognition: WNL  Sleep:  Good   Screenings:   Assessment and Plan: This patient is a 64 year old female with a history of bipolar disorder.  She continues to be stable on her regimen.  She will continue Prozac 20 mg daily for depression, Zyprexa 10 mg daily for mood stabilization and Cogentin 0.5 mg daily to prevent side effects from Zyprexa.  She will return to see me in 6 months   Levonne Spiller, MD 04/19/2020, 3:09 PM

## 2020-11-10 DIAGNOSIS — Z1231 Encounter for screening mammogram for malignant neoplasm of breast: Secondary | ICD-10-CM | POA: Diagnosis not present

## 2020-11-21 ENCOUNTER — Encounter (HOSPITAL_COMMUNITY): Payer: Self-pay | Admitting: Psychiatry

## 2020-11-21 ENCOUNTER — Other Ambulatory Visit: Payer: Self-pay

## 2020-11-21 ENCOUNTER — Telehealth (INDEPENDENT_AMBULATORY_CARE_PROVIDER_SITE_OTHER): Payer: Medicare Other | Admitting: Psychiatry

## 2020-11-21 DIAGNOSIS — F319 Bipolar disorder, unspecified: Secondary | ICD-10-CM

## 2020-11-21 MED ORDER — BENZTROPINE MESYLATE 0.5 MG PO TABS: 0.5000 mg | ORAL_TABLET | Freq: Every day | ORAL | 3 refills | Status: DC

## 2020-11-21 MED ORDER — OLANZAPINE 10 MG PO TABS
10.0000 mg | ORAL_TABLET | Freq: Every day | ORAL | 3 refills | Status: DC
Start: 2020-11-21 — End: 2021-01-03

## 2020-11-21 MED ORDER — FLUOXETINE HCL 20 MG PO CAPS: 20.0000 mg | ORAL_CAPSULE | Freq: Every day | ORAL | 3 refills | Status: DC

## 2020-11-21 NOTE — Progress Notes (Signed)
Virtual Visit via Telephone Note  I connected with Amanda Lara on 11/21/20 at  2:00 PM EDT by telephone and verified that I am speaking with the correct person using two identifiers.  Location: Patient: home Provider: home   I discussed the limitations, risks, security and privacy concerns of performing an evaluation and management service by telephone and the availability of in person appointments. I also discussed with the patient that there may be a patient responsible charge related to this service. The patient expressed understanding and agreed to proceed.    I discussed the assessment and treatment plan with the patient. The patient was provided an opportunity to ask questions and all were answered. The patient agreed with the plan and demonstrated an understanding of the instructions.   The patient was advised to call back or seek an in-person evaluation if the symptoms worsen or if the condition fails to improve as anticipated.  I provided 15 minutes of non-face-to-face time during this encounter.   Levonne Spiller, MD  [facility] MD/PA/NP OP Progress Note  11/21/2020 2:07 PM Amanda Lara  MRN:  [id number]  Chief Complaint:  Chief Complaint    Depression; Anxiety; Follow-up     HPI: This patient is a 65 year old married white female who lives in [place] with her husband they have no children. 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  The patient states she's had a history of mental illness dating back to age 16. At that time she was hospitalized for severe depression with suicidal ideation. She's had approximately 5 more hospitalizations since then. At times she's been extremely depressed and had been cutting herself 6 years ago prior to her last hospitalization. She's had bad reactions to Haldol and Depakote but is currently doing well on her current regimen.  She's sleeping well, her energy is good and her mood  is good. She is enjoying all for activities. She's had no thoughts whatsoever of self-harm. She's not had any side effects from Zyprexa. Her primary doctors following her labs and her blood sugar has been normal  Patient returns for follow-up after 6 months.  She states that she is doing very well.  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  Her mood is good she denies any thoughts of depression self-harm suicidal ideation or auditory visual hallucinations.  She has been sleeping well. Visit Diagnosis:    ICD-10-CM   1. Bipolar 1 disorder (HCC)  F31.9 FLUoxetine (PROZAC) 20 MG capsule    benztropine (COGENTIN) 0.5 MG tablet    Past Psychiatric History: Several hospitalizations in her younger years  Past Medical History:  Past Medical History:  Diagnosis Date  . Arthritis   . Bipolar disorder ([place])   . Chronic kidney disease   . Depression   . GERD (gastroesophageal reflux disease)   . History of UTI   . Obesity     Past Surgical History:  Procedure Laterality Date  . ABDOMINAL HYSTERECTOMY    . APPENDECTOMY    . COLONOSCOPY N/A 08/24/2016   Procedure: COLONOSCOPY;  Surgeon: Danie Binder, MD;  [facility];  Service: Endoscopy;  Laterality: N/A;  11:30 AM  . HERNIA REPAIR    . Left Kidney and ureter removal  08/18/2012  . POLYPECTOMY  08/24/2016   Procedure: POLYPECTOMY;  Surgeon: Danie Binder, MD;  [facility];  Service: Endoscopy;;  sigmoid  colon,  ascending colon polyp, transverse colon polyp  . SHOULDER SURGERY      Family Psychiatric History: See below  Family History:  Family History  Problem Relation Age of Onset  . Dementia Father   . Depression Father   . Alcohol abuse Father   . Depression Brother   . Drug abuse Brother   . Alcohol abuse Brother   . Anxiety disorder Brother   . Depression Sister   . Seizures Sister   . Bipolar disorder Mother   . Bipolar disorder  Maternal Aunt   . Bipolar disorder Maternal Grandmother   . ADD / ADHD Neg Hx   . OCD Neg Hx   . Paranoid behavior Neg Hx   . Schizophrenia Neg Hx   . Sexual abuse Neg Hx   . Physical abuse Neg Hx     Social History:  Social History   Socioeconomic History  . Marital status: Married    Spouse name: Not on file  . Number of children: Not on file  . Years of education: Not on file  . Highest education level: Not on file  Occupational History  . Not on file  Tobacco Use  . Smoking status: Never Smoker  . Smokeless tobacco: Never Used  Vaping Use  . Vaping Use: Never used  Substance and Sexual Activity  . Alcohol use: No  . Drug use: No  . Sexual activity: Not Currently  Other Topics Concern  . Not on file  Social History Narrative  . Not on file   Social Determinants of Health   Financial Resource Strain: Not on file  Food Insecurity: Not on file  Transportation Needs: Not on file  Physical Activity: Not on file  Stress: Not on file  Social Connections: Not on file    Allergies:  Allergies  Allergen Reactions  . Depakote [Divalproex Sodium] Other (See Comments)    Tremors so bad that she scalded herself twice with coffee.  . Lithium Nausea And Vomiting  . Reglan [Metoclopramide] Other (See Comments)    Mouth dystonia/distortion  . Codeine Nausea And Vomiting  . Latex     Metabolic Disorder Labs: Lab Results  Component Value Date   HGBA1C 5.7 (H) 10/11/2011   MPG 117 (H) 10/11/2011   No results found for: PROLACTIN Lab Results  Component Value Date   CHOL 246 (H) 11/03/2012   TRIG 112 11/03/2012   HDL 53 11/03/2012   CHOLHDL 4.6 11/03/2012   VLDL 22 11/03/2012   LDLCALC 171 (H) 11/03/2012   No results found for: TSH  Therapeutic Level Labs: No results found for: LITHIUM Lab Results  Component Value Date   VALPROATE 78.4 05/25/2009   VALPROATE 63.3 05/17/2009   No components found for:  CBMZ  Current Medications: Current Outpatient  Medications  Medication Sig Dispense Refill  . benztropine (COGENTIN) 0.5 MG tablet Take 1 tablet (0.5 mg total) by mouth daily. 90 tablet 3  . FLUoxetine (PROZAC) 20 MG capsule Take 1 capsule (20 mg total) by mouth daily. 90 capsule 3  . naproxen sodium (ANAPROX) 220 MG tablet Take 440 mg by mouth 2 (two) times daily as needed (for pain.).     Marland Kitchen OLANZapine (ZYPREXA) 10 MG tablet Take 1 tablet (10 mg total) by mouth at bedtime. 90 tablet 3  . Omega-3 Fatty Acids (FISH OIL) 1200 MG CAPS Take 1,200 mg by mouth daily.    . pantoprazole (PROTONIX) 40 MG tablet Take  40 mg by mouth daily.       No current facility-administered medications for this visit.     Musculoskeletal: Strength & Muscle Tone: within normal limits Gait & Station: normal Patient leans: N/A  Psychiatric Specialty Exam: Review of Systems  All other systems reviewed and are negative.   There were no vitals taken for this visit.There is no height or weight on file to calculate BMI.  General Appearance: NA  Eye Contact:  NA  Speech:  Clear and Coherent  Volume:  Normal  Mood:  Euthymic  Affect:  NA  Thought Process:  Goal Directed  Orientation:  Full (Time, Place, and Person)  Thought Content: WDL   Suicidal Thoughts:  No  Homicidal Thoughts:  No  Memory:  Immediate;   Good Recent;   Good Remote;   Fair  Judgement:  Good  Insight:  Good  Psychomotor Activity:  Normal  Concentration:  Concentration: Good and Attention Span: Good  Recall:  Good  Fund of Knowledge: Good  Language: Good  Akathisia:  No  Handed:  Right  AIMS (if indicated): not done  Assets:  Communication Skills Desire for Improvement Physical Health Resilience Social Support Talents/Skills  ADL's:  Impaired  Cognition: WNL  Sleep:  Good   Screenings: PHQ2-9   Flowsheet Row Video Visit from 11/21/2020 in [facility]  PHQ-2 Total Score 0    Flowsheet Row Video Visit from 11/21/2020 in  [facility] No Risk       Assessment and Plan: This patient is a 65 year old female with a history of bipolar disorder.  She continues to be stable on her regimen.  She will continue Prozac 20 mg daily for depression, Zyprexa 10 mg daily for mood stabilization and Cogentin 0.5 mg daily to prevent side effects from Zyprexa.  She will return to see me in 6 months   Levonne Spiller, MD 11/21/2020, 2:07 PM

## 2021-01-03 ENCOUNTER — Other Ambulatory Visit (HOSPITAL_COMMUNITY): Payer: Self-pay | Admitting: Psychiatry

## 2021-05-08 ENCOUNTER — Encounter (HOSPITAL_COMMUNITY): Payer: Self-pay | Admitting: Psychiatry

## 2021-05-08 ENCOUNTER — Other Ambulatory Visit: Payer: Self-pay

## 2021-05-08 ENCOUNTER — Telehealth (INDEPENDENT_AMBULATORY_CARE_PROVIDER_SITE_OTHER): Payer: Medicare Other | Admitting: Psychiatry

## 2021-05-08 DIAGNOSIS — F319 Bipolar disorder, unspecified: Secondary | ICD-10-CM | POA: Diagnosis not present

## 2021-05-08 MED ORDER — OLANZAPINE 10 MG PO TABS
10.0000 mg | ORAL_TABLET | Freq: Every day | ORAL | 3 refills | Status: DC
Start: 2021-05-08 — End: 2021-10-04

## 2021-05-08 MED ORDER — FLUOXETINE HCL 20 MG PO CAPS: 20.0000 mg | ORAL_CAPSULE | Freq: Every day | ORAL | 3 refills | Status: DC

## 2021-05-08 MED ORDER — BENZTROPINE MESYLATE 0.5 MG PO TABS: 0.5000 mg | ORAL_TABLET | Freq: Every day | ORAL | 3 refills | Status: DC

## 2021-05-08 NOTE — Progress Notes (Signed)
Virtual Visit via Telephone Note  I connected with Amanda Lara on 05/08/21 at  3:00 PM EDT by telephone and verified that I am speaking with the correct person using two identifiers.  Location: Patient: home Provider: home office   I discussed the limitations, risks, security and privacy concerns of performing an evaluation and management service by telephone and the availability of in person appointments. I also discussed with the patient that there may be a patient responsible charge related to this service. The patient expressed understanding and agreed to proceed.       I discussed the assessment and treatment plan with the patient. The patient was provided an opportunity to ask questions and all were answered. The patient agreed with the plan and demonstrated an understanding of the instructions.   The patient was advised to call back or seek an in-person evaluation if the symptoms worsen or if the condition fails to improve as anticipated.  I provided 14 minutes of non-face-to-face time during this encounter.   Levonne Spiller, MD  [facility] MD/PA/NP OP Progress Note  05/08/2021 3:16 PM Amanda Lara  MRN:  [id number]  Chief Complaint:  Chief Complaint   Depression; Anxiety; Follow-up    HPI: This patient is a 65 year old married white female who lives in [place] with her husband they have no children. 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   The patient states she's had a history of mental illness dating back to age 65. At that time she was hospitalized for severe depression with suicidal ideation. She's had approximately 5 more hospitalizations since then. At times she's been extremely depressed and had been cutting herself 6 years ago prior to her last hospitalization. She's had bad reactions to Haldol and Depakote but is currently doing well on her current regimen.   She's sleeping well, her energy is good  and her mood is good. She is enjoying all for activities. She's had no thoughts whatsoever of self-harm. She's not had any side effects from Zyprexa. Her primary doctors following her labs and her blood sugar has been normal  The patient returns for follow-up after 6 months.  She states that for the most part she is doing well.  Her mood and energy are good.  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  Her energy is good.  However she relates that she is having trouble falling asleep at night.  It takes her a long time to get to sleep.  However once she falls asleep she stays asleep for 7 to 8 hours.  The patient does drink a little bit of caffeine which includes coffee and Pepsi's.  She is not on any new medications.  She does not go on a phone or tablet before bed.  Her his sleep hygiene appears to be good.  I suggested a starting point that she cut back the caffeine try to get exercise every day and add melatonin if needed.  She is agreeable to these suggestions. Visit Diagnosis:    ICD-10-CM   1. Bipolar 1 disorder (HCC)  F31.9 FLUoxetine (PROZAC) 20 MG capsule    benztropine (COGENTIN) 0.5 MG tablet      Past Psychiatric History: Several hospitalizations in her younger years  Past Medical History:  Past Medical History:  Diagnosis Date   Arthritis    Bipolar disorder ([place])    Chronic kidney disease  Depression    GERD (gastroesophageal reflux disease)    History of UTI    Obesity     Past Surgical History:  Procedure Laterality Date   ABDOMINAL HYSTERECTOMY     APPENDECTOMY     COLONOSCOPY N/A 08/24/2016   Procedure: COLONOSCOPY;  Surgeon: Danie Binder, MD;  [facility];  Service: Endoscopy;  Laterality: N/A;  11:30 AM   HERNIA REPAIR     Left Kidney and ureter removal  08/18/2012   POLYPECTOMY  08/24/2016   Procedure: POLYPECTOMY;  Surgeon: Danie Binder, MD;  [facility];  Service: Endoscopy;;  sigmoid  colon,   ascending colon polyp, transverse colon polyp   SHOULDER SURGERY      Family Psychiatric History: see below  Family History:  Family History  Problem Relation Age of Onset   Dementia Father    Depression Father    Alcohol abuse Father    Depression Brother    Drug abuse Brother    Alcohol abuse Brother    Anxiety disorder Brother    Depression Sister    Seizures Sister    Bipolar disorder Mother    Bipolar disorder Maternal Aunt    Bipolar disorder Maternal Grandmother    ADD / ADHD Neg Hx    OCD Neg Hx    Paranoid behavior Neg Hx    Schizophrenia Neg Hx    Sexual abuse Neg Hx    Physical abuse Neg Hx     Social History:  Social History   Socioeconomic History   Marital status: Married    Spouse name: Not on file   Number of children: Not on file   Years of education: Not on file   Highest education level: Not on file  Occupational History   Not on file  Tobacco Use   Smoking status: Never   Smokeless tobacco: Never  Vaping Use   Vaping Use: Never used  Substance and Sexual Activity   Alcohol use: No   Drug use: No   Sexual activity: Not Currently  Other Topics Concern   Not on file  Social History Narrative   Not on file   Social Determinants of Health   Financial Resource Strain: Not on file  Food Insecurity: Not on file  Transportation Needs: Not on file  Physical Activity: Not on file  Stress: Not on file  Social Connections: Not on file    Allergies:  Allergies  Allergen Reactions   Depakote [Divalproex Sodium] Other (See Comments)    Tremors so bad that she scalded herself twice with coffee.   Lithium Nausea And Vomiting   Reglan [Metoclopramide] Other (See Comments)    Mouth dystonia/distortion   Codeine Nausea And Vomiting   Latex     Metabolic Disorder Labs: Lab Results  Component Value Date   HGBA1C 5.7 (H) 10/11/2011   MPG 117 (H) 10/11/2011   No results found for: PROLACTIN Lab Results  Component Value Date   CHOL 246  (H) 11/03/2012   TRIG 112 11/03/2012   HDL 53 11/03/2012   CHOLHDL 4.6 11/03/2012   VLDL 22 11/03/2012   LDLCALC 171 (H) 11/03/2012   No results found for: TSH  Therapeutic Level Labs: No results found for: LITHIUM Lab Results  Component Value Date   VALPROATE 78.4 05/25/2009   VALPROATE 63.3 05/17/2009   No components found for:  CBMZ  Current Medications: Current Outpatient Medications  Medication Sig Dispense Refill   benztropine (COGENTIN) 0.5  MG tablet Take 1 tablet (0.5 mg total) by mouth daily. 90 tablet 3   FLUoxetine (PROZAC) 20 MG capsule Take 1 capsule (20 mg total) by mouth daily. 90 capsule 3   naproxen sodium (ANAPROX) 220 MG tablet Take 440 mg by mouth 2 (two) times daily as needed (for pain.).      OLANZapine (ZYPREXA) 10 MG tablet Take 1 tablet (10 mg total) by mouth at bedtime. 90 tablet 3   Omega-3 Fatty Acids (FISH OIL) 1200 MG CAPS Take 1,200 mg by mouth daily.     pantoprazole (PROTONIX) 40 MG tablet Take 40 mg by mouth daily.       No current facility-administered medications for this visit.     Musculoskeletal: Strength & Muscle Tone: na Gait & Station: na Patient leans: N/A  Psychiatric Specialty Exam: Review of Systems  Psychiatric/Behavioral:  Positive for sleep disturbance.   All other systems reviewed and are negative.  There were no vitals taken for this visit.There is no height or weight on file to calculate BMI.  General Appearance: NA  Eye Contact:  NA  Speech:  Clear and Coherent  Volume:  Normal  Mood:  Euthymic  Affect:  NA  Thought Process:  Goal Directed  Orientation:  Full (Time, Place, and Person)  Thought Content: WDL   Suicidal Thoughts:  No  Homicidal Thoughts:  No  Memory:  Immediate;   Good Recent;   Good Remote;   Good  Judgement:  Good  Insight:  Good  Psychomotor Activity:  Normal  Concentration:  Concentration: Good and Attention Span: Good  Recall:  Good  Fund of Knowledge: Good  Language: Good   Akathisia:  No  Handed:  Right  AIMS (if indicated): not done  Assets:  Communication Skills Desire for Improvement Physical Health Resilience Social Support Talents/Skills  ADL's:  Intact  Cognition: WNL  Sleep:  Fair   Screenings: PHQ2-9    Flowsheet Row Video Visit from 05/08/2021 in [place] [facility] Video Visit from 11/21/2020 in [place] [facility]  PHQ-2 Total Score 0 0      Flowsheet Row Video Visit from 05/08/2021 in [place] [facility] Video Visit from 11/21/2020 in [place] No Risk No Risk        Assessment and Plan: This patient is a 65 year old female with a history of bipolar disorder.  She is having some trouble with sleep initiation.  For now she will add melatonin 5  to 10 mg.  She will continue Prozac 20 mg daily for depression, on Zyprexa 10 mg daily for mood stabilization, Cogentin 0.5 mg daily to prevent side effects from Zyprexa.  She will return to see me in 6 months   Levonne Spiller, MD 05/08/2021, 3:16 PM

## 2021-06-19 DIAGNOSIS — R109 Unspecified abdominal pain: Secondary | ICD-10-CM | POA: Diagnosis not present

## 2021-06-19 DIAGNOSIS — J0100 Acute maxillary sinusitis, unspecified: Secondary | ICD-10-CM | POA: Diagnosis not present

## 2021-06-19 DIAGNOSIS — R5383 Other fatigue: Secondary | ICD-10-CM | POA: Diagnosis not present

## 2021-06-19 DIAGNOSIS — Z0000 Encounter for general adult medical examination without abnormal findings: Secondary | ICD-10-CM | POA: Diagnosis not present

## 2021-06-19 DIAGNOSIS — Z299 Encounter for prophylactic measures, unspecified: Secondary | ICD-10-CM | POA: Diagnosis not present

## 2021-06-19 DIAGNOSIS — R040 Epistaxis: Secondary | ICD-10-CM | POA: Diagnosis not present

## 2021-06-19 DIAGNOSIS — E559 Vitamin D deficiency, unspecified: Secondary | ICD-10-CM | POA: Diagnosis not present

## 2021-06-19 DIAGNOSIS — Z79899 Other long term (current) drug therapy: Secondary | ICD-10-CM | POA: Diagnosis not present

## 2021-06-19 DIAGNOSIS — Z7189 Other specified counseling: Secondary | ICD-10-CM | POA: Diagnosis not present

## 2021-10-04 ENCOUNTER — Encounter (HOSPITAL_COMMUNITY): Payer: Self-pay | Admitting: Psychiatry

## 2021-10-04 ENCOUNTER — Other Ambulatory Visit: Payer: Self-pay

## 2021-10-04 ENCOUNTER — Telehealth (INDEPENDENT_AMBULATORY_CARE_PROVIDER_SITE_OTHER): Payer: Medicare Other | Admitting: Psychiatry

## 2021-10-04 DIAGNOSIS — F319 Bipolar disorder, unspecified: Secondary | ICD-10-CM | POA: Diagnosis not present

## 2021-10-04 MED ORDER — BENZTROPINE MESYLATE 0.5 MG PO TABS: 0.5000 mg | ORAL_TABLET | Freq: Every day | ORAL | 3 refills | Status: DC

## 2021-10-04 MED ORDER — OLANZAPINE 10 MG PO TABS
10.0000 mg | ORAL_TABLET | Freq: Every day | ORAL | 3 refills | Status: DC
Start: 2021-10-04 — End: 2022-04-03

## 2021-10-04 MED ORDER — FLUOXETINE HCL 20 MG PO CAPS: 20.0000 mg | ORAL_CAPSULE | Freq: Every day | ORAL | 3 refills | Status: DC

## 2021-10-04 NOTE — Progress Notes (Signed)
Virtual Visit via Telephone Note  I connected with AGNESS SIBRIAN on 10/04/21 at 11:20 AM EST by telephone and verified that I am speaking with the correct person using two identifiers.  Location: Patient: home Provider: office   I discussed the limitations, risks, security and privacy concerns of performing an evaluation and management service by telephone and the availability of in person appointments. I also discussed with the patient that there may be a patient responsible charge related to this service. The patient expressed understanding and agreed to proceed.      I discussed the assessment and treatment plan with the patient. The patient was provided an opportunity to ask questions and all were answered. The patient agreed with the plan and demonstrated an understanding of the instructions.   The patient was advised to call back or seek an in-person evaluation if the symptoms worsen or if the condition fails to improve as anticipated.  I provided 10 minutes of non-face-to-face time during this encounter.   Levonne Spiller, MD  [facility] MD/PA/NP OP Progress Note  10/04/2021 11:30 AM GALILEA QUITO  MRN:  [id number]  Chief Complaint:  Chief Complaint   Depression; Manic Behavior; Follow-up    HPI: This patient is a 66 year old married white female who lives in [place] with her husband they have no children. 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   The patient states she's had a history of mental illness dating back to age 66. At that time she was hospitalized for severe depression with suicidal ideation. She's had approximately 5 more hospitalizations since then. At times she's been extremely depressed and had been cutting herself 6 years ago prior to her last hospitalization. She's had bad reactions to Haldol and Depakote but is currently doing well on her current regimen.  The patient returns for follow-up after 6  months.  She states that she is continuing to do well.  Her mood has been good.  Her energy is good and she is doing a lot of projects around her house.  She is sleeping well at night she has not had any depressive or manic symptoms. Visit Diagnosis:    ICD-10-CM   1. Bipolar 1 disorder (HCC)  F31.9 FLUoxetine (PROZAC) 20 MG capsule    benztropine (COGENTIN) 0.5 MG tablet      Past Psychiatric History: Several hospitalizations in her younger years  Past Medical History:  Past Medical History:  Diagnosis Date   Arthritis    Bipolar disorder ([place])    Chronic kidney disease    Depression    GERD (gastroesophageal reflux disease)    History of UTI    Obesity     Past Surgical History:  Procedure Laterality Date   ABDOMINAL HYSTERECTOMY     APPENDECTOMY     COLONOSCOPY N/A 08/24/2016   Procedure: COLONOSCOPY;  Surgeon: Danie Binder, MD;  [facility];  Service: Endoscopy;  Laterality: N/A;  11:30 AM   HERNIA REPAIR     Left Kidney and ureter removal  08/18/2012   POLYPECTOMY  08/24/2016   Procedure: POLYPECTOMY;  Surgeon: Danie Binder, MD;  [facility];  Service: Endoscopy;;  sigmoid  colon,  ascending colon polyp, transverse colon polyp   SHOULDER SURGERY      Family Psychiatric History: see below  Family History:  Family History  Problem Relation Age of Onset   Dementia Father    Depression  Father    Alcohol abuse Father    Depression Brother    Drug abuse Brother    Alcohol abuse Brother    Anxiety disorder Brother    Depression Sister    Seizures Sister    Bipolar disorder Mother    Bipolar disorder Maternal Aunt    Bipolar disorder Maternal Grandmother    ADD / ADHD Neg Hx    OCD Neg Hx    Paranoid behavior Neg Hx    Schizophrenia Neg Hx    Sexual abuse Neg Hx    Physical abuse Neg Hx     Social History:  Social History   Socioeconomic History   Marital status: Married    Spouse name: Not on file   Number of children: Not on  file   Years of education: Not on file   Highest education level: Not on file  Occupational History   Not on file  Tobacco Use   Smoking status: Never   Smokeless tobacco: Never  Vaping Use   Vaping Use: Never used  Substance and Sexual Activity   Alcohol use: No   Drug use: No   Sexual activity: Not Currently  Other Topics Concern   Not on file  Social History Narrative   Not on file   Social Determinants of Health   Financial Resource Strain: Not on file  Food Insecurity: Not on file  Transportation Needs: Not on file  Physical Activity: Not on file  Stress: Not on file  Social Connections: Not on file    Allergies:  Allergies  Allergen Reactions   Depakote [Divalproex Sodium] Other (See Comments)    Tremors so bad that she scalded herself twice with coffee.   Lithium Nausea And Vomiting   Reglan [Metoclopramide] Other (See Comments)    Mouth dystonia/distortion   Codeine Nausea And Vomiting   Latex     Metabolic Disorder Labs: Lab Results  Component Value Date   HGBA1C 5.7 (H) 10/11/2011   MPG 117 (H) 10/11/2011   No results found for: PROLACTIN Lab Results  Component Value Date   CHOL 246 (H) 11/03/2012   TRIG 112 11/03/2012   HDL 53 11/03/2012   CHOLHDL 4.6 11/03/2012   VLDL 22 11/03/2012   LDLCALC 171 (H) 11/03/2012   No results found for: TSH  Therapeutic Level Labs: No results found for: LITHIUM Lab Results  Component Value Date   VALPROATE 78.4 05/25/2009   VALPROATE 63.3 05/17/2009   No components found for:  CBMZ  Current Medications: Current Outpatient Medications  Medication Sig Dispense Refill   benztropine (COGENTIN) 0.5 MG tablet Take 1 tablet (0.5 mg total) by mouth daily. 90 tablet 3   FLUoxetine (PROZAC) 20 MG capsule Take 1 capsule (20 mg total) by mouth daily. 90 capsule 3   naproxen sodium (ANAPROX) 220 MG tablet Take 440 mg by mouth 2 (two) times daily as needed (for pain.).      OLANZapine (ZYPREXA) 10 MG tablet Take 1  tablet (10 mg total) by mouth at bedtime. 90 tablet 3   Omega-3 Fatty Acids (FISH OIL) 1200 MG CAPS Take 1,200 mg by mouth daily.     pantoprazole (PROTONIX) 40 MG tablet Take 40 mg by mouth daily.       No current facility-administered medications for this visit.     Musculoskeletal: Strength & Muscle Tone: na Gait & Station: na Patient leans: N/A  Psychiatric Specialty Exam: Review of Systems  All other systems reviewed and are  negative.  There were no vitals taken for this visit.There is no height or weight on file to calculate BMI.  General Appearance: NA  Eye Contact:  NA  Speech:  Clear and Coherent  Volume:  Normal  Mood:  Euthymic  Affect:  NA  Thought Process:  Goal Directed  Orientation:  Full (Time, Place, and Person)  Thought Content: WDL   Suicidal Thoughts:  No  Homicidal Thoughts:  No  Memory:  Immediate;   Good Recent;   Good Remote;   Good  Judgement:  Good  Insight:  Good  Psychomotor Activity:  Normal  Concentration:  Concentration: Good and Attention Span: Good  Recall:  Good  Fund of Knowledge: Good  Language: Good  Akathisia:  No  Handed:  Right  AIMS (if indicated): not done  Assets:  Communication Skills Desire for Improvement Physical Health Resilience Social Support Talents/Skills  ADL's:  Intact  Cognition: WNL  Sleep:  Good   Screenings: PHQ2-9    Flowsheet Row Video Visit from 10/04/2021 in [place] [facility] Video Visit from 05/08/2021 in [place] [facility] Video Visit from 11/21/2020 in [place] [facility]  PHQ-2 Total Score 0 0 0      Flowsheet Row Video Visit from 10/04/2021 in [place] [facility] Video Visit from 05/08/2021 in [place] [facility] Video Visit from 11/21/2020 in [place] No  Risk No Risk No Risk        Assessment and Plan: This patient is a 66 year old female with a history of bipolar disorder.  She is continuing to do well on her current regimen.  She will continue Prozac 20 mg daily for depression, Zyprexa 10 mg daily for mood stabilization and Cogentin 0.5 mg daily to prevent side effects from Zyprexa.  She will return to see me in 6 months   Levonne Spiller, MD 10/04/2021, 11:30 AM

## 2021-11-14 ENCOUNTER — Telehealth: Payer: Self-pay | Admitting: Internal Medicine

## 2021-11-14 NOTE — Telephone Encounter (Signed)
DOES PATIENT NEED OFFICE OR NURSE VISIT FOR REPEAT TCS  ?

## 2021-12-08 DIAGNOSIS — Z1231 Encounter for screening mammogram for malignant neoplasm of breast: Secondary | ICD-10-CM | POA: Diagnosis not present

## 2021-12-18 DIAGNOSIS — Z299 Encounter for prophylactic measures, unspecified: Secondary | ICD-10-CM | POA: Diagnosis not present

## 2021-12-18 DIAGNOSIS — Z789 Other specified health status: Secondary | ICD-10-CM | POA: Diagnosis not present

## 2021-12-18 DIAGNOSIS — Z713 Dietary counseling and surveillance: Secondary | ICD-10-CM | POA: Diagnosis not present

## 2021-12-18 DIAGNOSIS — K219 Gastro-esophageal reflux disease without esophagitis: Secondary | ICD-10-CM | POA: Diagnosis not present

## 2022-01-15 ENCOUNTER — Encounter: Payer: Self-pay | Admitting: *Deleted

## 2022-01-15 ENCOUNTER — Other Ambulatory Visit: Payer: Self-pay | Admitting: *Deleted

## 2022-01-15 ENCOUNTER — Ambulatory Visit (INDEPENDENT_AMBULATORY_CARE_PROVIDER_SITE_OTHER): Payer: Self-pay | Admitting: *Deleted

## 2022-01-15 VITALS — Ht 65.0 in | Wt 182.0 lb

## 2022-01-15 DIAGNOSIS — Z860100 Personal history of colon polyps, unspecified: Secondary | ICD-10-CM

## 2022-01-15 MED ORDER — PEG 3350-KCL-NA BICARB-NACL 420 G PO SOLR: 4000.0000 mL | Freq: Once | ORAL | 0 refills | Status: AC

## 2022-01-15 NOTE — Progress Notes (Signed)
Spoke to pt.  Scheduled procedure for 01/30/2022 at 1:00, arrival 11:30 at [facility].  Pt is ASA 2 per discussion with Venetia Night, NP.  Reviewed prep instructions with pt by phone.  Pt made aware to pick up prep kit at her pharmacy.  She is aware that she will need to pick up an enema and dulcolax (OTC items).  Informed pt that she needs to have labs drawn at [facility] on 01/26/2022.  Confirmed mailing address and mailed instructions.

## 2022-01-15 NOTE — Progress Notes (Signed)
Okay to schedule. Questionable kidney disease. Needs BMP prior to procedure and would use Nulytely or Trylite.

## 2022-01-15 NOTE — Progress Notes (Addendum)
Gastroenterology Pre-Procedure Review  Request Date: 01/15/2022 Requesting Physician: Last TCS 08/24/2016 done by Dr. Oneida Alar, tubular adenoma (x4), small internal hemorrhoids, family hx of colon cancer (2 uncles)  PATIENT REVIEW QUESTIONS: The patient responded to the following health history questions as indicated:    1. Diabetes Melitis: no 2. Joint replacements in the past 12 months: no 3. Major health problems in the past 3 months: no 4. Has an artificial valve or MVP: no 5. Has a defibrillator: no 6. Has been advised in past to take antibiotics in advance of a procedure like teeth cleaning: no 7. Family history of colon cancer: yes, 2 uncles: ages unknown  41. Alcohol Use: no 9. Illicit drug Use: no 10. History of sleep apnea: no  11. History of coronary artery or other vascular stents placed within the last 12 months: no 12. History of any prior anesthesia complications: no 13. Body mass index is 30.29 kg/m.    MEDICATIONS & ALLERGIES:    Patient reports the following regarding taking any blood thinners:   Plavix? no Aspirin? no Coumadin? no Brilinta? no Xarelto? no Eliquis? no Pradaxa? no Savaysa? no Effient? no  Patient confirms/reports the following medications:  Current Outpatient Medications  Medication Sig Dispense Refill   benztropine (COGENTIN) 0.5 MG tablet Take 1 tablet (0.5 mg total) by mouth daily. 90 tablet 3   FLUoxetine (PROZAC) 20 MG capsule Take 1 capsule (20 mg total) by mouth daily. 90 capsule 3   naproxen sodium (ANAPROX) 220 MG tablet Take 440 mg by mouth 2 (two) times daily as needed (for pain.).      OLANZapine (ZYPREXA) 10 MG tablet Take 1 tablet (10 mg total) by mouth at bedtime. 90 tablet 3   Omega-3 Fatty Acids (FISH OIL) 1200 MG CAPS Take 1,200 mg by mouth daily.     pantoprazole (PROTONIX) 40 MG tablet Take 40 mg by mouth daily.       No current facility-administered medications for this visit.    Patient confirms/reports the following  allergies:  Allergies  Allergen Reactions   Depakote [Divalproex Sodium] Other (See Comments)    Tremors so bad that she scalded herself twice with coffee.   Lithium Nausea And Vomiting   Reglan [Metoclopramide] Other (See Comments)    Mouth dystonia/distortion   Codeine Nausea And Vomiting   Latex     No orders of the defined types were placed in this encounter.   [place] INFORMATION Primary Insurance: [facility],  ID #: [id number],  Group #: [id number] Pre-Cert / Auth required: Yes, approved online 09/28/1939-02/27/813 Pre-Cert / Josem Kaufmann #: [id number]  SCHEDULE INFORMATION: Procedure has been scheduled as follows:  Date: 01/30/2022 , Time: 1:00 Location: [facility] with Dr. Abbey Chatters  This Gastroenterology Pre-Precedure Review Form is being routed to the following provider(s): Venetia Night, NP

## 2022-01-15 NOTE — Addendum Note (Signed)
Addended by: Metro Kung on: 01/15/2022 02:35 PM   Modules accepted: Orders

## 2022-01-16 ENCOUNTER — Other Ambulatory Visit: Payer: Self-pay | Admitting: *Deleted

## 2022-01-26 ENCOUNTER — Other Ambulatory Visit (HOSPITAL_COMMUNITY)
Admission: RE | Admit: 2022-01-26 | Discharge: 2022-01-26 | Disposition: A | Discharge status: Home / Self Care | Payer: Medicare Other | Source: Ambulatory Visit | Attending: Internal Medicine | Admitting: Internal Medicine

## 2022-01-26 DIAGNOSIS — Z86010 Personal history of colonic polyps: Secondary | ICD-10-CM | POA: Diagnosis not present

## 2022-01-26 LAB — BASIC METABOLIC PANEL
Anion gap: 8 (ref 5–15)
BUN: 20 mg/dL (ref 8–23)
CO2: 25 mmol/L (ref 22–32)
Calcium: 9.4 mg/dL (ref 8.9–10.3)
Chloride: 106 mmol/L (ref 98–111)
Creatinine, Ser: 1.21 mg/dL — ABNORMAL HIGH (ref 0.44–1.00)
GFR, Estimated: 49 mL/min — ABNORMAL LOW (ref >60)
Glucose, Bld: 107 mg/dL — ABNORMAL HIGH (ref 70–99)
Potassium: 4 mmol/L (ref 3.5–5.1)
Sodium: 139 mmol/L (ref 135–145)

## 2022-01-30 ENCOUNTER — Other Ambulatory Visit: Payer: Self-pay

## 2022-01-30 ENCOUNTER — Encounter (HOSPITAL_COMMUNITY)
Admission: RE | Disposition: A | Discharge status: Home / Self Care | Payer: Self-pay | Source: Ambulatory Visit | Attending: Internal Medicine

## 2022-01-30 ENCOUNTER — Encounter (HOSPITAL_COMMUNITY): Payer: Self-pay

## 2022-01-30 ENCOUNTER — Ambulatory Visit (HOSPITAL_COMMUNITY): Payer: Medicare Other | Admitting: Certified Registered"

## 2022-01-30 ENCOUNTER — Ambulatory Visit (HOSPITAL_BASED_OUTPATIENT_CLINIC_OR_DEPARTMENT_OTHER): Payer: Medicare Other | Admitting: Certified Registered"

## 2022-01-30 ENCOUNTER — Ambulatory Visit (HOSPITAL_COMMUNITY)
Admission: RE | Admit: 2022-01-30 | Discharge: 2022-01-30 | Disposition: A | Discharge status: Home / Self Care | Payer: Medicare Other | Source: Ambulatory Visit | Attending: Internal Medicine | Admitting: Internal Medicine

## 2022-01-30 DIAGNOSIS — Z09 Encounter for follow-up examination after completed treatment for conditions other than malignant neoplasm: Secondary | ICD-10-CM

## 2022-01-30 DIAGNOSIS — N189 Chronic kidney disease, unspecified: Secondary | ICD-10-CM | POA: Insufficient documentation

## 2022-01-30 DIAGNOSIS — Z1211 Encounter for screening for malignant neoplasm of colon: Secondary | ICD-10-CM

## 2022-01-30 DIAGNOSIS — K635 Polyp of colon: Secondary | ICD-10-CM | POA: Diagnosis not present

## 2022-01-30 DIAGNOSIS — F319 Bipolar disorder, unspecified: Secondary | ICD-10-CM

## 2022-01-30 DIAGNOSIS — D122 Benign neoplasm of ascending colon: Secondary | ICD-10-CM | POA: Insufficient documentation

## 2022-01-30 DIAGNOSIS — D120 Benign neoplasm of cecum: Secondary | ICD-10-CM | POA: Diagnosis not present

## 2022-01-30 DIAGNOSIS — K219 Gastro-esophageal reflux disease without esophagitis: Secondary | ICD-10-CM | POA: Diagnosis not present

## 2022-01-30 DIAGNOSIS — Z800 Family history of malignant neoplasm of digestive organs: Secondary | ICD-10-CM | POA: Diagnosis not present

## 2022-01-30 DIAGNOSIS — K648 Other hemorrhoids: Secondary | ICD-10-CM | POA: Diagnosis not present

## 2022-01-30 DIAGNOSIS — Z86010 Personal history of colonic polyps: Secondary | ICD-10-CM | POA: Insufficient documentation

## 2022-01-30 HISTORY — PX: POLYPECTOMY: SHX5525

## 2022-01-30 HISTORY — PX: COLONOSCOPY WITH PROPOFOL: SHX5780

## 2022-01-30 SURGERY — COLONOSCOPY WITH PROPOFOL
Anesthesia: General

## 2022-01-30 MED ORDER — LACTATED RINGERS IV SOLN
INTRAVENOUS | Status: DC
Start: 2022-01-30 — End: 2022-01-30

## 2022-01-30 MED ORDER — EPHEDRINE SULFATE (PRESSORS) 50 MG/ML IJ SOLN
INTRAMUSCULAR | Status: DC | PRN
  Administered 2022-01-30 (×2): 5 mg via INTRAVENOUS

## 2022-01-30 MED ORDER — LIDOCAINE HCL (CARDIAC) PF 100 MG/5ML IV SOSY
PREFILLED_SYRINGE | INTRAVENOUS | Status: DC | PRN
  Administered 2022-01-30: 50 mg via INTRAVENOUS

## 2022-01-30 MED ORDER — PROPOFOL 10 MG/ML IV BOLUS
INTRAVENOUS | Status: DC | PRN
  Administered 2022-01-30: 50 mg via INTRAVENOUS
  Administered 2022-01-30: 40 mg via INTRAVENOUS
  Administered 2022-01-30: 20 mg via INTRAVENOUS
  Administered 2022-01-30: 50 mg via INTRAVENOUS
  Administered 2022-01-30: 100 mg via INTRAVENOUS
  Administered 2022-01-30: 40 mg via INTRAVENOUS

## 2022-01-30 NOTE — Discharge Instructions (Addendum)
  Colonoscopy Discharge Instructions  Read the instructions outlined below and refer to this sheet in the next few weeks. These discharge instructions provide you with general information on caring for yourself after you leave the hospital. Your doctor may also give you specific instructions. While your treatment has been planned according to the most current medical practices available, unavoidable complications occasionally occur.   ACTIVITY You may resume your regular activity, but move at a slower pace for the next 24 hours.  Take frequent rest periods for the next 24 hours.  Walking will help get rid of the air and reduce the bloated feeling in your belly (abdomen).  No driving for 24 hours (because of the medicine (anesthesia) used during the test).   Do not sign any important legal documents or operate any machinery for 24 hours (because of the anesthesia used during the test).  NUTRITION Drink plenty of fluids.  You may resume your normal diet as instructed by your doctor.  Begin with a light meal and progress to your normal diet. Heavy or fried foods are harder to digest and may make you feel sick to your stomach (nauseated).  Avoid alcoholic beverages for 24 hours or as instructed.  MEDICATIONS You may resume your normal medications unless your doctor tells you otherwise.  WHAT YOU CAN EXPECT TODAY Some feelings of bloating in the abdomen.  Passage of more gas than usual.  Spotting of blood in your stool or on the toilet paper.  IF YOU HAD POLYPS REMOVED DURING THE COLONOSCOPY: No aspirin products for 7 days or as instructed.  No alcohol for 7 days or as instructed.  Eat a soft diet for the next 24 hours.  FINDING OUT THE RESULTS OF YOUR TEST Not all test results are available during your visit. If your test results are not back during the visit, make an appointment with your caregiver to find out the results. Do not assume everything is normal if you have not heard from your  caregiver or the medical facility. It is important for you to follow up on all of your test results.  SEEK IMMEDIATE MEDICAL ATTENTION IF: You have more than a spotting of blood in your stool.  Your belly is swollen (abdominal distention).  You are nauseated or vomiting.  You have a temperature over 101.  You have abdominal pain or discomfort that is severe or gets worse throughout the day.   Your colonoscopy revealed 2 polyp(s) which I removed successfully. Await pathology results, my office will contact you. I recommend repeating colonoscopy in 5 years for surveillance purposes. Otherwise follow up with GI as needed.    I hope you have a great rest of your week!  Charles K. Carver, D.O. Gastroenterology and Hepatology [facility]  

## 2022-01-30 NOTE — H&P (Signed)
Primary Care Physician:  Monico Blitz, MD Primary Gastroenterologist:  Dr. Abbey Chatters  Pre-Procedure History & Physical: HPI:  Amanda Lara is a 66 y.o. female is here for a colonoscopy for colon cancer screening purposes.  Patient denies any family history of colorectal cancer.  No melena or hematochezia.  No abdominal pain or unintentional weight loss.  No change in bowel habits.  Overall feels well from a GI standpoint.  Past Medical History:  Diagnosis Date   Arthritis    Bipolar disorder ([place])    Chronic kidney disease    Depression    GERD (gastroesophageal reflux disease)    History of UTI    Obesity     Past Surgical History:  Procedure Laterality Date   ABDOMINAL HYSTERECTOMY     APPENDECTOMY     COLONOSCOPY N/A 08/24/2016   Procedure: COLONOSCOPY;  Surgeon: Danie Binder, MD;  [facility];  Service: Endoscopy;  Laterality: N/A;  11:30 AM   HERNIA REPAIR     Left Kidney and ureter removal  08/18/2012   POLYPECTOMY  08/24/2016   Procedure: POLYPECTOMY;  Surgeon: Danie Binder, MD;  [facility]: [facility];  Service: Endoscopy;;  sigmoid  colon,  ascending colon polyp, transverse colon polyp   SHOULDER SURGERY      Prior to Admission medications   Medication Sig Start Date End Date Taking? Authorizing Provider  benztropine (COGENTIN) 0.5 MG tablet Take 1 tablet (0.5 mg total) by mouth daily. 10/04/21  Yes Cloria Spring, MD  CALCIUM CITRATE PO Take 600 mg by mouth daily.   Yes [provider]  FLUoxetine (PROZAC) 20 MG capsule Take 1 capsule (20 mg total) by mouth daily. 10/04/21  Yes Cloria Spring, MD  Garlic 1771 MG CAPS Take 1,000 mg by mouth daily.   Yes [provider]  naproxen sodium (ANAPROX) 220 MG tablet Take 440 mg by mouth 2 (two) times daily as needed (for pain.).    Yes [provider]  OLANZapine (ZYPREXA) 10 MG tablet Take 1 tablet (10 mg total) by mouth at bedtime. 10/04/21  Yes Cloria Spring, MD  Omega-3 Fatty Acids  (FISH OIL PO) Take 2,000 mg by mouth daily.   Yes [provider]  pantoprazole (PROTONIX) 40 MG tablet Take 40 mg by mouth daily.     Yes [provider]  Red Yeast Rice 600 MG TABS Take 600 mg by mouth daily.   Yes [provider]  Vitamin D3 (VITAMIN D) 25 MCG tablet Take 1,000 Units by mouth daily.   Yes [provider]    Allergies as of 01/15/2022 - Review Complete 01/15/2022  Allergen Reaction Noted   Depakote [divalproex sodium] Other (See Comments) 08/13/2012   Lithium Nausea And Vomiting 08/13/2012   Reglan [metoclopramide] Other (See Comments) 08/13/2012   Codeine Nausea And Vomiting 08/07/2011   Latex  08/07/2011    Family History  Problem Relation Age of Onset   Dementia Father    Depression Father    Alcohol abuse Father    Depression Brother    Drug abuse Brother    Alcohol abuse Brother    Anxiety disorder Brother    Depression Sister    Seizures Sister    Bipolar disorder Mother    Bipolar disorder Maternal Aunt    Bipolar disorder Maternal Grandmother    ADD / ADHD Neg Hx    OCD Neg Hx    Paranoid behavior Neg Hx  Schizophrenia Neg Hx    Sexual abuse Neg Hx    Physical abuse Neg Hx     Social History   Socioeconomic History   Marital status: Married    Spouse name: Not on file   Number of children: Not on file   Years of education: Not on file   Highest education level: Not on file  Occupational History   Not on file  Tobacco Use   Smoking status: Never   Smokeless tobacco: Never  Vaping Use   Vaping Use: Never used  Substance and Sexual Activity   Alcohol use: No   Drug use: No   Sexual activity: Not Currently  Other Topics Concern   Not on file  Social History Narrative   Not on file   Social Determinants of Health   Financial Resource Strain: Not on file  Food Insecurity: Not on file  Transportation Needs: Not on file  Physical Activity: Not on file  Stress: Not on file  Social Connections:  Not on file  Intimate Partner Violence: Not on file    Review of Systems: See HPI, otherwise negative ROS  Physical Exam: Vital signs in last 24 hours:     General:   Alert,  Well-developed, well-nourished, pleasant and cooperative in NAD Head:  Normocephalic and atraumatic. Eyes:  Sclera clear, no icterus.   Conjunctiva pink. Ears:  Normal auditory acuity. Nose:  No deformity, discharge,  or lesions. Mouth:  No deformity or lesions, dentition normal. Neck:  Supple; no masses or thyromegaly. Lungs:  Clear throughout to auscultation.   No wheezes, crackles, or rhonchi. No acute distress. Heart:  Regular rate and rhythm; no murmurs, clicks, rubs,  or gallops. Abdomen:  Soft, nontender and nondistended. No masses, hepatosplenomegaly or hernias noted. Normal bowel sounds, without guarding, and without rebound.   Msk:  Symmetrical without gross deformities. Normal posture. Extremities:  Without clubbing or edema. Neurologic:  Alert and  oriented x4;  grossly normal neurologically. Skin:  Intact without significant lesions or rashes. Cervical Nodes:  No significant cervical adenopathy. Psych:  Alert and cooperative. Normal mood and affect.  Impression/Plan: Amanda Lara is here for a colonoscopy to be performed for surveillance purposes. Last TCS 08/24/2016 done by Dr. Oneida Alar, tubular adenoma (x4), small internal hemorrhoids, family hx of colon cancer (2 uncles)  The risks of the procedure including infection, bleed, or perforation as well as benefits, limitations, alternatives and imponderables have been reviewed with the patient. Questions have been answered. All parties agreeable.

## 2022-01-30 NOTE — Op Note (Signed)
[facility] Patient Name: Amanda Lara Procedure Date: 01/30/2022 11:59 AM MRN: [id number] Date of Birth: 16-Apr-1956 Attending MD: Elon Alas. Abbey Chatters DO CSN: [id number] Age: 66 Admit Type: Outpatient Procedure:                Colonoscopy Indications:              Surveillance: Personal history of adenomatous                            polyps on last colonoscopy 5 years ago Providers:                Elon Alas. Abbey Chatters, DO, Lambert Mody, Lurline Del, RN Referring MD:              Medicines:                See the Anesthesia note for documentation of the                            administered medications Complications:            No immediate complications. Estimated Blood Loss:     Estimated blood loss was minimal. Procedure:                Pre-Anesthesia Assessment:                           - The anesthesia plan was to use monitored                            anesthesia care (MAC).                           After obtaining informed consent, the colonoscope                            was passed under direct vision. Throughout the                            procedure, the patient's blood pressure, pulse, and                            oxygen saturations were monitored continuously. The                            [id number]-HQ190L ([id number]) scope was introduced through                            the anus and advanced to the the cecum, identified                            by appendiceal orifice and ileocecal valve. The                            colonoscopy was performed without difficulty. The  patient tolerated the procedure well. The quality                            of the bowel preparation was evaluated using the                            BBPS [facility] Bowel Preparation Scale) with scores                            of: Right Colon = 3, Transverse Colon = 3 and Left                            Colon = 3 (entire mucosa seen well  with no residual                            staining, small fragments of stool or opaque                            liquid). The total BBPS score equals 9. Scope In: 12:16:15 PM Scope Out: 12:31:34 PM Scope Withdrawal Time: 0 hours 8 minutes 0 seconds  Total Procedure Duration: 0 hours 15 minutes 19 seconds  Findings:      The perianal and digital rectal examinations were normal.      Non-bleeding internal hemorrhoids were found during endoscopy.      A 4 mm polyp was found in the appendiceal orifice. The polyp was       sessile. The polyp was removed with a cold snare. Resection and       retrieval were complete.      A 5 mm polyp was found in the ascending colon. The polyp was sessile.       The polyp was removed with a cold snare. Resection and retrieval were       complete.      The exam was otherwise without abnormality. Impression:               - Non-bleeding internal hemorrhoids.                           - One 4 mm polyp at the appendiceal orifice,                            removed with a cold snare. Resected and retrieved.                           - One 5 mm polyp in the ascending colon, removed                            with a cold snare. Resected and retrieved.                           - The examination was otherwise normal. Moderate Sedation:      Per Anesthesia Care Recommendation:           - Patient has a contact number available for  emergencies. The signs and symptoms of potential                            delayed complications were discussed with the                            patient. Return to normal activities tomorrow.                            Written discharge instructions were provided to the                            patient.                           - Resume previous diet.                           - Continue present medications.                           - Await pathology results.                           - Repeat  colonoscopy in 5 years for surveillance.                           - Return to GI clinic PRN. Procedure Code(s):        --- Professional ---                           [phone], Colonoscopy, flexible; with removal of                            tumor(s), polyp(s), or other lesion(s) by snare                            technique Diagnosis Code(s):        --- Professional ---                           Z86.010, Personal history of colonic polyps                           K63.5, Polyp of colon                           K64.8, Other hemorrhoids CPT copyright 2019 American Medical Association. All rights reserved. The codes documented in this report are preliminary and upon coder review may  be revised to meet current compliance requirements. Elon Alas. Abbey Chatters, DO [place] Abbey Chatters, DO 01/30/2022 12:35:29 PM This report has been signed electronically. Number of Addenda: 0

## 2022-01-30 NOTE — Anesthesia Preprocedure Evaluation (Signed)
Anesthesia Evaluation  Patient identified by MRN, date of birth, ID band Patient awake    Reviewed: Allergy & Precautions, H&P , NPO status , Patient's Chart, lab work & pertinent test results, reviewed documented beta blocker date and time   Airway Mallampati: II  TM Distance: >3 FB Neck ROM: full    Dental no notable dental hx.    Pulmonary neg pulmonary ROS,    Pulmonary exam normal breath sounds clear to auscultation       Cardiovascular Exercise Tolerance: Good negative cardio ROS   Rhythm:regular Rate:Normal     Neuro/Psych PSYCHIATRIC DISORDERS Depression Bipolar Disorder negative neurological ROS     GI/Hepatic Neg liver ROS, GERD  Medicated,  Endo/Other  negative endocrine ROS  Renal/GU CRFRenal disease  negative genitourinary   Musculoskeletal   Abdominal   Peds  Hematology negative hematology ROS (+)   Anesthesia Other Findings   Reproductive/Obstetrics negative OB ROS                             Anesthesia Physical Anesthesia Plan  ASA: 3  Anesthesia Plan: General   Post-op Pain Management:    Induction:   PONV Risk Score and Plan: Propofol infusion  Airway Management Planned:   Additional Equipment:   Intra-op Plan:   Post-operative Plan:   Informed Consent: I have reviewed the patients History and Physical, chart, labs and discussed the procedure including the risks, benefits and alternatives for the proposed anesthesia with the patient or authorized representative who has indicated his/her understanding and acceptance.     Dental Advisory Given  Plan Discussed with: CRNA  Anesthesia Plan Comments:         Anesthesia Quick Evaluation

## 2022-01-30 NOTE — Transfer of Care (Signed)
Immediate Anesthesia Transfer of Care Note  Patient: Amanda Lara  Procedure(s) Performed: COLONOSCOPY WITH PROPOFOL POLYPECTOMY  Patient Location: Endoscopy Unit  Anesthesia Type:General  Level of Consciousness: awake  Airway & Oxygen Therapy: Patient Spontanous Breathing  Post-op Assessment: Report given to RN and Post -op Vital signs reviewed and stable  Post vital signs: Reviewed and stable  Last Vitals:  Vitals Value Taken Time  BP    Temp    Pulse 76   Resp 15   SpO2 94     Last Pain:  Vitals:   01/30/22 1214  TempSrc:   PainSc: 0-No pain      Patients Stated Pain Goal: 8 (71/85/50 1586)  Complications: No notable events documented.

## 2022-01-31 LAB — SURGICAL PATHOLOGY

## 2022-02-01 NOTE — Anesthesia Postprocedure Evaluation (Signed)
Anesthesia Post Note  Patient: Amanda Lara  Procedure(s) Performed: COLONOSCOPY WITH PROPOFOL POLYPECTOMY  Patient location during evaluation: Phase II Anesthesia Type: General Level of consciousness: awake Pain management: pain level controlled Vital Signs Assessment: post-procedure vital signs reviewed and stable Respiratory status: spontaneous breathing and respiratory function stable Cardiovascular status: blood pressure returned to baseline and stable Postop Assessment: no headache and no apparent nausea or vomiting Anesthetic complications: no Comments: Late entry   No notable events documented.   Last Vitals:  Vitals:   01/30/22 1234 01/30/22 1238  BP: (!) 91/41 (!) 116/50  Pulse: 76 74  Resp: 13 16  Temp: 36.5 C   SpO2: 94% 97%    Last Pain:  Vitals:   01/30/22 1238  TempSrc:   PainSc: 0-No pain                 Louann Sjogren

## 2022-02-07 ENCOUNTER — Encounter (HOSPITAL_COMMUNITY): Payer: Self-pay | Admitting: Internal Medicine

## 2022-04-03 ENCOUNTER — Telehealth (INDEPENDENT_AMBULATORY_CARE_PROVIDER_SITE_OTHER): Payer: Medicare Other | Admitting: Psychiatry

## 2022-04-03 ENCOUNTER — Encounter (HOSPITAL_COMMUNITY): Payer: Self-pay | Admitting: Psychiatry

## 2022-04-03 DIAGNOSIS — F319 Bipolar disorder, unspecified: Secondary | ICD-10-CM | POA: Diagnosis not present

## 2022-04-03 MED ORDER — BENZTROPINE MESYLATE 0.5 MG PO TABS: 0.5000 mg | ORAL_TABLET | Freq: Every day | ORAL | 3 refills | Status: DC

## 2022-04-03 MED ORDER — FLUOXETINE HCL 20 MG PO CAPS: 20.0000 mg | ORAL_CAPSULE | Freq: Every day | ORAL | 3 refills | Status: DC

## 2022-04-03 MED ORDER — OLANZAPINE 10 MG PO TABS
10.0000 mg | ORAL_TABLET | Freq: Every day | ORAL | 3 refills | Status: DC
Start: 2022-04-03 — End: 2022-10-01

## 2022-04-03 NOTE — Progress Notes (Signed)
BH MD/PA/NP OP Progress Note  04/03/2022 2:15 PM Amanda Lara  MRN:  [id number]  Chief Complaint:  Chief Complaint  Patient presents with   Depression   Anxiety   Follow-up   HCW:CBJS patient is a 66 year old married white female who lives in [place] with her husband they have no children. 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   The patient states she's had a history of mental illness dating back to age 29. At that time she was hospitalized for severe depression with suicidal ideation. She's had approximately 5 more hospitalizations since then. At times she's been extremely depressed and had been cutting herself 6 years ago prior to her last hospitalization. She's had bad reactions to Haldol and Depakote but is currently doing well on her current regimen.  The patient returns for follow-up after 6 months.  She states she is continuing to do very well.  She still babysits some and cleans houses occasionally.  She is staying busy and active.  Her husband has congestive heart failure and she helps to take care of him.  She is sleeping well her energy is good and she denies significant depression anxiety mood swings or thoughts of self-harm.  She denies any side effects from medicine such as twitching or jerking Visit Diagnosis:    ICD-10-CM   1. Bipolar 1 disorder (HCC)  F31.9 FLUoxetine (PROZAC) 20 MG capsule    benztropine (COGENTIN) 0.5 MG tablet      Past Psychiatric History: Several hospitalizations in her younger years  Past Medical History:  Past Medical History:  Diagnosis Date   Arthritis    Bipolar disorder ([place])    Chronic kidney disease    Depression    GERD (gastroesophageal reflux disease)    History of UTI    Obesity     Past Surgical History:  Procedure Laterality Date   ABDOMINAL HYSTERECTOMY     APPENDECTOMY     COLONOSCOPY N/A 08/24/2016   Procedure: COLONOSCOPY;  Surgeon: Danie Binder,  MD;  [facility];  Service: Endoscopy;  Laterality: N/A;  11:30 AM   COLONOSCOPY WITH PROPOFOL N/A 01/30/2022   Procedure: COLONOSCOPY WITH PROPOFOL;  Surgeon: Eloise Harman, DO;  [facility];  Service: Endoscopy;  Laterality: N/A;  1:00 / ASA 2   HERNIA REPAIR     Left Kidney and ureter removal  08/18/2012   POLYPECTOMY  08/24/2016   Procedure: POLYPECTOMY;  Surgeon: Danie Binder, MD;  [facility];  Service: Endoscopy;;  sigmoid  colon,  ascending colon polyp, transverse colon polyp   POLYPECTOMY  01/30/2022   Procedure: POLYPECTOMY;  Surgeon: Eloise Harman, DO;  [facility];  Service: Endoscopy;;   SHOULDER SURGERY      Family Psychiatric History: See below  Family History:  Family History  Problem Relation Age of Onset   Dementia Father    Depression Father    Alcohol abuse Father    Depression Brother    Drug abuse Brother    Alcohol abuse Brother    Anxiety disorder Brother    Depression Sister    Seizures Sister    Bipolar disorder Mother    Bipolar disorder Maternal Aunt    Bipolar disorder Maternal Grandmother    ADD / ADHD Neg Hx    OCD Neg Hx    Paranoid behavior Neg Hx    Schizophrenia Neg Hx  Sexual abuse Neg Hx    Physical abuse Neg Hx     Social History:  Social History   Socioeconomic History   Marital status: Married    Spouse name: Not on file   Number of children: Not on file   Years of education: Not on file   Highest education level: Not on file  Occupational History   Not on file  Tobacco Use   Smoking status: Never   Smokeless tobacco: Never  Vaping Use   Vaping Use: Never used  Substance and Sexual Activity   Alcohol use: No   Drug use: No   Sexual activity: Not Currently  Other Topics Concern   Not on file  Social History Narrative   Not on file   Social Determinants of Health   Financial Resource Strain: Not on file  Food Insecurity: Not on file  Transportation Needs: Not  on file  Physical Activity: Not on file  Stress: Not on file  Social Connections: Not on file    Allergies:  Allergies  Allergen Reactions   Depakote [Divalproex Sodium] Other (See Comments)    Tremors so bad that she scalded herself twice with coffee.   Lithium Nausea And Vomiting   Reglan [Metoclopramide] Other (See Comments)    Mouth dystonia/distortion   Codeine Nausea And Vomiting   Haloperidol Lactate Other (See Comments)    Make her stiff   Latex Hives    Metabolic Disorder Labs: Lab Results  Component Value Date   HGBA1C 5.7 (H) 10/11/2011   MPG 117 (H) 10/11/2011   No results found for: "PROLACTIN" Lab Results  Component Value Date   CHOL 246 (H) 11/03/2012   TRIG 112 11/03/2012   HDL 53 11/03/2012   CHOLHDL 4.6 11/03/2012   VLDL 22 11/03/2012   LDLCALC 171 (H) 11/03/2012   No results found for: "TSH"  Therapeutic Level Labs: No results found for: "LITHIUM" Lab Results  Component Value Date   VALPROATE 78.4 05/25/2009   VALPROATE 63.3 05/17/2009   No results found for: "CBMZ"  Current Medications: Current Outpatient Medications  Medication Sig Dispense Refill   benztropine (COGENTIN) 0.5 MG tablet Take 1 tablet (0.5 mg total) by mouth daily. 90 tablet 3   CALCIUM CITRATE PO Take 600 mg by mouth daily.     FLUoxetine (PROZAC) 20 MG capsule Take 1 capsule (20 mg total) by mouth daily. 90 capsule 3   Garlic 4034 MG CAPS Take 1,000 mg by mouth daily.     naproxen sodium (ANAPROX) 220 MG tablet Take 440 mg by mouth 2 (two) times daily as needed (for pain.).      OLANZapine (ZYPREXA) 10 MG tablet Take 1 tablet (10 mg total) by mouth at bedtime. 90 tablet 3   Omega-3 Fatty Acids (FISH OIL PO) Take 2,000 mg by mouth daily.     pantoprazole (PROTONIX) 40 MG tablet Take 40 mg by mouth daily.       Red Yeast Rice 600 MG TABS Take 600 mg by mouth daily.     Vitamin D3 (VITAMIN D) 25 MCG tablet Take 1,000 Units by mouth daily.     No current  facility-administered medications for this visit.     Musculoskeletal: Strength & Muscle Tone: n/a Gait & Station: na Patient leans: N/A  Psychiatric Specialty Exam: Review of Systems  All other systems reviewed and are negative.   There were no vitals taken for this visit.There is no height or weight on file to calculate  BMI.  General Appearance: NA  Eye Contact:  NA  Speech:  Clear and Coherent  Volume:  Normal  Mood:  Euthymic  Affect:  na  Thought Process:  Goal Directed  Orientation:  Full (Time, Place, and Person)  Thought Content: WDL   Suicidal Thoughts:  No  Homicidal Thoughts:  No  Memory:  Immediate;   Good Recent;   Good Remote;   Good  Judgement:  Good  Insight:  Good  Psychomotor Activity:  Normal  Concentration:  Concentration: Good and Attention Span: Good  Recall:  Good  Fund of Knowledge: Good  Language: Good  Akathisia:  No  Handed:  Right  AIMS (if indicated): not done  Assets:  Communication Skills Desire for Improvement Physical Health Resilience Social Support Talents/Skills  ADL's:  intact  Cognition: WNL  Sleep:  Good   Screenings: PHQ2-9    Flowsheet Row Video Visit from 04/03/2022 in [facility] [facility] Video Visit from 10/04/2021 in [place] [facility] Video Visit from 05/08/2021 in [facility] Video Visit from 11/21/2020 in [facility]  PHQ-2 Total Score 0 0 0 0      Flowsheet Row Video Visit from 04/03/2022 in [facility] Admission (Discharged) from 01/30/2022 in [place] Video Visit from 10/04/2021 in [place] No Risk No Risk No Risk        Assessment and Plan: This patient is a 66 year old female with a history of bipolar disorder.  She continues to do well on  her current regimen.  She will continue Prozac 20 mg daily for depression, Zyprexa 10 mg daily for mood stabilization and Cogentin 0.5 mg daily to prevent side effects from Zyprexa.  She will return to see me in 6 months  Collaboration of Care: Collaboration of Care: Primary Care Provider AEB notes will be shared with PCP at patient's request  Patient/Guardian was advised Release of Information must be obtained prior to any record release in order to collaborate their care with an outside provider. Patient/Guardian was advised if they have not already done so to contact the registration department to sign all necessary forms in order for [place] to release information regarding their care.   Consent: Patient/Guardian gives verbal consent for treatment and assignment of benefits for services provided during this visit. Patient/Guardian expressed understanding and agreed to proceed.    Levonne Spiller, MD 04/03/2022, 2:15 PM

## 2022-04-06 NOTE — Progress Notes (Signed)
Virtual Visit via Telephone Note  I connected with Amanda Lara on 04/06/22 at  2:00 PM EDT by telephone and verified that I am speaking with the correct person using two identifiers.  Location: Patient: home Provider: office   I discussed the limitations, risks, security and privacy concerns of performing an evaluation and management service by telephone and the availability of in person appointments. I also discussed with the patient that there may be a patient responsible charge related to this service. The patient expressed understanding and agreed to proceed.       I discussed the assessment and treatment plan with the patient. The patient was provided an opportunity to ask questions and all were answered. The patient agreed with the plan and demonstrated an understanding of the instructions.   The patient was advised to call back or seek an in-person evaluation if the symptoms worsen or if the condition fails to improve as anticipated.  I provided 12 minutes of non-face-to-face time during this encounter.   Levonne Spiller, MD  [facility] MD/PA/NP OP Progress Note  04/06/2022 12:23 PM Amanda Lara  MRN:  [id number]  Chief Complaint:  Chief Complaint  Patient presents with   Depression   Anxiety   Follow-up   YHC:WCBJ patient is a 66 year old married white female who lives in [place] with her husband they have no children. 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   The patient states she's had a history of mental illness dating back to age 31. At that time she was hospitalized for severe depression with suicidal ideation. She's had approximately 5 more hospitalizations since then. At times she's been extremely depressed and had been cutting herself 6 years ago prior to her last hospitalization. She's had bad reactions to Haldol and Depakote but is currently doing well on her current regimen.  The patient returns  for follow-up after 6 months.  She states she is continuing to do very well.  She still babysits some and cleans houses occasionally.  She is staying busy and active.  Her husband has congestive heart failure and she helps to take care of him.  She is sleeping well her energy is good and she denies significant depression anxiety mood swings or thoughts of self-harm.  She denies any side effects from medicine such as twitching or jerking Visit Diagnosis:    ICD-10-CM   1. Bipolar 1 disorder (HCC)  F31.9 FLUoxetine (PROZAC) 20 MG capsule    benztropine (COGENTIN) 0.5 MG tablet      Past Psychiatric History: Several hospitalizations in her younger years  Past Medical History:  Past Medical History:  Diagnosis Date   Arthritis    Bipolar disorder ([place])    Chronic kidney disease    Depression    GERD (gastroesophageal reflux disease)    History of UTI    Obesity     Past Surgical History:  Procedure Laterality Date   ABDOMINAL HYSTERECTOMY     APPENDECTOMY     COLONOSCOPY N/A 08/24/2016   Procedure: COLONOSCOPY;  Surgeon: Danie Binder, MD;  [facility];  Service: Endoscopy;  Laterality: N/A;  11:30 AM   COLONOSCOPY WITH PROPOFOL N/A 01/30/2022   Procedure: COLONOSCOPY WITH PROPOFOL;  Surgeon: Eloise Harman, DO;  [facility];  Service: Endoscopy;  Laterality: N/A;  1:00 / ASA 2   HERNIA REPAIR     Left Kidney and ureter removal  08/18/2012   POLYPECTOMY  08/24/2016   Procedure: POLYPECTOMY;  Surgeon: Danie Binder, MD;  [facility];  Service: Endoscopy;;  sigmoid  colon,  ascending colon polyp, transverse colon polyp   POLYPECTOMY  01/30/2022   Procedure: POLYPECTOMY;  Surgeon: Eloise Harman, DO;  [facility];  Service: Endoscopy;;   SHOULDER SURGERY      Family Psychiatric History: See below  Family History:  Family History  Problem Relation Age of Onset   Dementia Father    Depression Father    Alcohol abuse Father     Depression Brother    Drug abuse Brother    Alcohol abuse Brother    Anxiety disorder Brother    Depression Sister    Seizures Sister    Bipolar disorder Mother    Bipolar disorder Maternal Aunt    Bipolar disorder Maternal Grandmother    ADD / ADHD Neg Hx    OCD Neg Hx    Paranoid behavior Neg Hx    Schizophrenia Neg Hx    Sexual abuse Neg Hx    Physical abuse Neg Hx     Social History:  Social History   Socioeconomic History   Marital status: Married    Spouse name: Not on file   Number of children: Not on file   Years of education: Not on file   Highest education level: Not on file  Occupational History   Not on file  Tobacco Use   Smoking status: Never   Smokeless tobacco: Never  Vaping Use   Vaping Use: Never used  Substance and Sexual Activity   Alcohol use: No   Drug use: No   Sexual activity: Not Currently  Other Topics Concern   Not on file  Social History Narrative   Not on file   Social Determinants of Health   Financial Resource Strain: Not on file  Food Insecurity: Not on file  Transportation Needs: Not on file  Physical Activity: Not on file  Stress: Not on file  Social Connections: Not on file    Allergies:  Allergies  Allergen Reactions   Depakote [Divalproex Sodium] Other (See Comments)    Tremors so bad that she scalded herself twice with coffee.   Lithium Nausea And Vomiting   Reglan [Metoclopramide] Other (See Comments)    Mouth dystonia/distortion   Codeine Nausea And Vomiting   Haloperidol Lactate Other (See Comments)    Make her stiff   Latex Hives    Metabolic Disorder Labs: Lab Results  Component Value Date   HGBA1C 5.7 (H) 10/11/2011   MPG 117 (H) 10/11/2011   No results found for: "PROLACTIN" Lab Results  Component Value Date   CHOL 246 (H) 11/03/2012   TRIG 112 11/03/2012   HDL 53 11/03/2012   CHOLHDL 4.6 11/03/2012   VLDL 22 11/03/2012   LDLCALC 171 (H) 11/03/2012   No results found for:  "TSH"  Therapeutic Level Labs: No results found for: "LITHIUM" Lab Results  Component Value Date   VALPROATE 78.4 05/25/2009   VALPROATE 63.3 05/17/2009   No results found for: "CBMZ"  Current Medications: Current Outpatient Medications  Medication Sig Dispense Refill   benztropine (COGENTIN) 0.5 MG tablet Take 1 tablet (0.5 mg total) by mouth daily. 90 tablet 3   CALCIUM CITRATE PO Take 600 mg by mouth daily.     FLUoxetine (PROZAC) 20 MG capsule Take 1 capsule (20 mg total) by mouth daily. 90 capsule 3  Garlic 6644 MG CAPS Take 1,000 mg by mouth daily.     naproxen sodium (ANAPROX) 220 MG tablet Take 440 mg by mouth 2 (two) times daily as needed (for pain.).      OLANZapine (ZYPREXA) 10 MG tablet Take 1 tablet (10 mg total) by mouth at bedtime. 90 tablet 3   Omega-3 Fatty Acids (FISH OIL PO) Take 2,000 mg by mouth daily.     pantoprazole (PROTONIX) 40 MG tablet Take 40 mg by mouth daily.       Red Yeast Rice 600 MG TABS Take 600 mg by mouth daily.     Vitamin D3 (VITAMIN D) 25 MCG tablet Take 1,000 Units by mouth daily.     No current facility-administered medications for this visit.     Musculoskeletal: Strength & Muscle Tone: n/a Gait & Station: na Patient leans: N/A  Psychiatric Specialty Exam: Review of Systems  All other systems reviewed and are negative.   There were no vitals taken for this visit.There is no height or weight on file to calculate BMI.  General Appearance: NA  Eye Contact:  NA  Speech:  Clear and Coherent  Volume:  Normal  Mood:  Euthymic  Affect:  na  Thought Process:  Goal Directed  Orientation:  Full (Time, Place, and Person)  Thought Content: WDL   Suicidal Thoughts:  No  Homicidal Thoughts:  No  Memory:  Immediate;   Good Recent;   Good Remote;   Good  Judgement:  Good  Insight:  Good  Psychomotor Activity:  Normal  Concentration:  Concentration: Good and Attention Span: Good  Recall:  Good  Fund of Knowledge: Good  Language:  Good  Akathisia:  No  Handed:  Right  AIMS (if indicated): not done  Assets:  Communication Skills Desire for Improvement Physical Health Resilience Social Support Talents/Skills  ADL's:  intact  Cognition: WNL  Sleep:  Good   Screenings: PHQ2-9    Flowsheet Row Video Visit from 04/03/2022 in [facility] Video Visit from 10/04/2021 in [facility] Video Visit from 05/08/2021 in [place] [facility] Video Visit from 11/21/2020 in [facility]  PHQ-2 Total Score 0 0 0 0      Flowsheet Row Video Visit from 04/03/2022 in [facility] Admission (Discharged) from 01/30/2022 in [place] Video Visit from 10/04/2021 in [place] No Risk No Risk No Risk        Assessment and Plan: This patient is a 66 year old female with a history of bipolar disorder.  She continues to do well on her current regimen.  She will continue Prozac 20 mg daily for depression, Zyprexa 10 mg daily for mood stabilization and Cogentin 0.5 mg daily to prevent side effects from Zyprexa.  She will return to see me in 6 months  Collaboration of Care: Collaboration of Care: Primary Care Provider AEB notes will be shared with PCP at patient's request  Patient/Guardian was advised Release of Information must be obtained prior to any record release in order to collaborate their care with an outside provider. Patient/Guardian was advised if they have not already done so to contact the registration department to sign all necessary forms in order for [place] to release information regarding their care.   Consent: Patient/Guardian gives verbal consent for treatment and assignment of benefits for services provided during this visit. Patient/Guardian expressed  understanding and agreed  to proceed.    Levonne Spiller, MD 04/06/2022, 12:23 PM

## 2022-07-17 DIAGNOSIS — Z7189 Other specified counseling: Secondary | ICD-10-CM | POA: Diagnosis not present

## 2022-07-17 DIAGNOSIS — Z299 Encounter for prophylactic measures, unspecified: Secondary | ICD-10-CM | POA: Diagnosis not present

## 2022-07-17 DIAGNOSIS — E559 Vitamin D deficiency, unspecified: Secondary | ICD-10-CM | POA: Diagnosis not present

## 2022-07-17 DIAGNOSIS — Z79899 Other long term (current) drug therapy: Secondary | ICD-10-CM | POA: Diagnosis not present

## 2022-07-17 DIAGNOSIS — Z0000 Encounter for general adult medical examination without abnormal findings: Secondary | ICD-10-CM | POA: Diagnosis not present

## 2022-07-17 DIAGNOSIS — R5383 Other fatigue: Secondary | ICD-10-CM | POA: Diagnosis not present

## 2022-08-06 DIAGNOSIS — M25579 Pain in unspecified ankle and joints of unspecified foot: Secondary | ICD-10-CM | POA: Diagnosis not present

## 2022-08-06 DIAGNOSIS — Z0000 Encounter for general adult medical examination without abnormal findings: Secondary | ICD-10-CM | POA: Diagnosis not present

## 2022-08-06 DIAGNOSIS — R600 Localized edema: Secondary | ICD-10-CM | POA: Diagnosis not present

## 2022-08-06 DIAGNOSIS — S82832A Other fracture of upper and lower end of left fibula, initial encounter for closed fracture: Secondary | ICD-10-CM | POA: Diagnosis not present

## 2022-08-06 DIAGNOSIS — Z713 Dietary counseling and surveillance: Secondary | ICD-10-CM | POA: Diagnosis not present

## 2022-08-06 DIAGNOSIS — Z299 Encounter for prophylactic measures, unspecified: Secondary | ICD-10-CM | POA: Diagnosis not present

## 2022-08-09 ENCOUNTER — Ambulatory Visit: Payer: Medicare Other | Admitting: Orthopaedic Surgery

## 2022-08-09 ENCOUNTER — Encounter: Payer: Self-pay | Admitting: Orthopaedic Surgery

## 2022-08-09 VITALS — Ht 64.0 in | Wt 192.0 lb

## 2022-08-09 DIAGNOSIS — S82832A Other fracture of upper and lower end of left fibula, initial encounter for closed fracture: Secondary | ICD-10-CM | POA: Diagnosis not present

## 2022-08-09 NOTE — Progress Notes (Signed)
Office Visit Note   Patient: Amanda Lara           Date of Birth: 05/09/1956           MRN: [id number] Visit Date: 08/09/2022              Requested by: Monico Blitz, MD [address] [place],  [place] [address] PCP: Monico Blitz, MD   Assessment & Plan: Visit Diagnoses:  1. Closed fracture of distal end of left fibula, unspecified fracture morphology, initial encounter     Plan: Short cam boot.  Elevation Tylenol for pain.  She can remove her boot for bathing her foot.  Return 5 to 6 weeks.  Follow-Up Instructions: No follow-ups on file.   Orders:  No orders of the defined types were placed in this encounter.  No orders of the defined types were placed in this encounter.     Procedures: No procedures performed   Clinical Data: No additional findings.   Subjective: Chief Complaint  Patient presents with   Left Ankle - Injury    DOI 08/05/2022    Injury   66 year old female injured her ankle when she fell in the kitchen on Sunday lateral ankle pain and nondisplaced fracture of the distal aspect of the fibula below the level of the mortise.  This is a left Weber A closed fracture.  She is ambulating in her shoe.  She has pain swelling and mild ecchymosis.  Review of Systems nonfunctioning kidney all other systems noncontributory to HPI possible bipolar 1 disorder.   Objective: Vital Signs: Ht '5\' 4"'$  (1.626 m)   Wt 192 lb (87.1 kg)   BMI 32.96 kg/m   Physical Exam Constitutional:      Appearance: She is well-developed.  HENT:     Head: Normocephalic.     Right Ear: External ear normal.     Left Ear: External ear normal. There is no impacted cerumen.  Eyes:     Pupils: Pupils are equal, round, and reactive to light.  Neck:     Thyroid: No thyromegaly.     Trachea: No tracheal deviation.  Cardiovascular:     Rate and Rhythm: Normal rate.  Pulmonary:     Effort: Pulmonary effort is normal.  Abdominal:     Palpations: Abdomen is soft.  Musculoskeletal:      Cervical back: No rigidity.  Skin:    General: Skin is warm and dry.  Neurological:     Mental Status: She is alert and oriented to person, place, and time.  Psychiatric:        Behavior: Behavior normal.     Ortho Exam tenderness over the lateral distal fibula left.  Skin is intact.  Specialty Comments:  No specialty comments available.  Imaging: egion Laterality Modality  Ankle left Computed Radiography   Impression  Acute nondisplaced infra syndesmotic distal fibular fracture.   Electronically Signed   By: Iven Finn M.D.   On: 08/06/2022 15:50 Narrative  CLINICAL DATA:  FALL Fall x yesterday. Left ankle pain/ swelling and bruising.  EXAM: LEFT ANKLE COMPLETE - 3+ VIEW  COMPARISON:  None Available.  FINDINGS: Acute nondisplaced distal infrasyndesmotic fibular fracture. No dislocation. Plantar calcaneal spur. No focal bone abnormality. Subcutaneus soft tissue edema. Procedure Note  Bluford Kaufmann, MD - 08/06/2022 Formatting of this note might be different from the original. CLINICAL DATA:  FALL Fall x yesterday. Left ankle pain/ swelling and bruising.  EXAM: LEFT ANKLE COMPLETE - 3+ VIEW  COMPARISON:  None Available.    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   Bipolar 1 disorder ([place]) 10/11/2011   Past Medical History:  Diagnosis Date   Arthritis    Bipolar disorder ([place])    Chronic kidney disease    Depression    GERD (gastroesophageal reflux disease)    History of UTI    Obesity     Family History  Problem Relation Age of Onset   Dementia Father     Depression Father    Alcohol abuse Father    Depression Brother    Drug abuse Brother    Alcohol abuse Brother    Anxiety disorder Brother    Depression Sister    Seizures Sister    Bipolar disorder Mother    Bipolar disorder Maternal Aunt    Bipolar disorder Maternal Grandmother    ADD / ADHD Neg Hx    OCD Neg Hx    Paranoid behavior Neg Hx    Schizophrenia Neg Hx    Sexual abuse Neg Hx    Physical abuse Neg Hx     Past Surgical History:  Procedure Laterality Date   ABDOMINAL HYSTERECTOMY     APPENDECTOMY     COLONOSCOPY N/A 08/24/2016   Procedure: COLONOSCOPY;  Surgeon: Danie Binder, MD;  [facility];  Service: Endoscopy;  Laterality: N/A;  11:30 AM   COLONOSCOPY WITH PROPOFOL N/A 01/30/2022   Procedure: COLONOSCOPY WITH PROPOFOL;  Surgeon: Eloise Harman, DO;  [facility];  Service: Endoscopy;  Laterality: N/A;  1:00 / ASA 2   HERNIA REPAIR     Left Kidney and ureter removal  08/18/2012   POLYPECTOMY  08/24/2016   Procedure: POLYPECTOMY;  Surgeon: Danie Binder, MD;  [facility];  Service: Endoscopy;;  sigmoid  colon,  ascending colon polyp, transverse colon polyp   POLYPECTOMY  01/30/2022   Procedure: POLYPECTOMY;  Surgeon: Eloise Harman, DO;  [facility];  Service: Endoscopy;;   SHOULDER SURGERY     Social History   Occupational History   Not on file  Tobacco Use   Smoking status: Never   Smokeless tobacco: Never  Vaping Use   Vaping Use: Never used  Substance and Sexual Activity   Alcohol use: No   Drug use: No   Sexual activity: Not Currently

## 2022-09-10 ENCOUNTER — Telehealth: Payer: Self-pay | Admitting: Orthopaedic Surgery

## 2022-09-10 NOTE — Telephone Encounter (Signed)
Can you cancel this appt for the pt in [place]? I did not know if there is a separate screen for that office. Let me know if there is anything that I can do to help. Thanks!

## 2022-09-10 NOTE — Telephone Encounter (Signed)
Pt called requesting to cancel appt with Dr. Lorin Mercy in [place] office. Pt states she will call back another day for appt

## 2022-09-13 ENCOUNTER — Ambulatory Visit: Payer: Medicare Other | Admitting: Orthopaedic Surgery

## 2022-10-01 ENCOUNTER — Ambulatory Visit (INDEPENDENT_AMBULATORY_CARE_PROVIDER_SITE_OTHER): Payer: HMO | Admitting: Psychiatry

## 2022-10-01 ENCOUNTER — Encounter (HOSPITAL_COMMUNITY): Payer: Self-pay | Admitting: Psychiatry

## 2022-10-01 DIAGNOSIS — F319 Bipolar disorder, unspecified: Secondary | ICD-10-CM | POA: Diagnosis not present

## 2022-10-01 MED ORDER — BENZTROPINE MESYLATE 0.5 MG PO TABS: 0.5000 mg | ORAL_TABLET | Freq: Every day | ORAL | 3 refills | Status: DC

## 2022-10-01 MED ORDER — OLANZAPINE 10 MG PO TABS: 10.0000 mg | ORAL_TABLET | Freq: Every day | ORAL | 3 refills | Status: DC

## 2022-10-01 MED ORDER — FLUOXETINE HCL 20 MG PO CAPS: 20.0000 mg | ORAL_CAPSULE | Freq: Every day | ORAL | 3 refills | Status: DC

## 2022-10-01 NOTE — Progress Notes (Signed)
[facility] MD/PA/NP OP Progress Note  10/01/2022 1:28 PM Amanda Lara  MRN:  [id number]  Chief Complaint:  Chief Complaint  Patient presents with   Depression   Anxiety   Follow-up   HPI: This patient is a 67year-old married white female who lives in [place] with her husband they have no children. 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   The patient states she's had a history of mental illness dating back to age 67. At that time she was hospitalized for severe depression with suicidal ideation. She's had approximately 5 more hospitalizations since then. At times she's been extremely depressed and had been cutting herself 6 years ago prior to her last hospitalization. She's had bad reactions to Haldol and Depakote but is currently doing well on her current regimen  The patient returns for follow-up after 6 months.  She continues to do well.  She is staying busy and active particularly babysitting her nieces kids.  Her husband had to have a valve replacement in his heart last summer and this has kept her busy caring for him but he is doing better now.  She states that her mood has been good and she denies significant depression anxiety or manic symptoms such as racing thoughts or insomnia.  She denies side effects from medication such as twitching or jerking or thoughts of self-harm or suicide Visit Diagnosis:    ICD-10-CM   1. Bipolar 1 disorder (HCC)  F31.9 FLUoxetine (PROZAC) 20 MG capsule    benztropine (COGENTIN) 0.5 MG tablet      Past Psychiatric History: Several hospitalizations in her younger years  Past Medical History:  Past Medical History:  Diagnosis Date   Arthritis    Bipolar disorder ([place])    Chronic kidney disease    Depression    GERD (gastroesophageal reflux disease)    History of UTI    Obesity     Past Surgical History:  Procedure Laterality Date   ABDOMINAL HYSTERECTOMY     APPENDECTOMY      COLONOSCOPY N/A 08/24/2016   Procedure: COLONOSCOPY;  Surgeon: Danie Binder, MD;  [facility];  Service: Endoscopy;  Laterality: N/A;  11:30 AM   COLONOSCOPY WITH PROPOFOL N/A 01/30/2022   Procedure: COLONOSCOPY WITH PROPOFOL;  Surgeon: Eloise Harman, DO;  [facility];  Service: Endoscopy;  Laterality: N/A;  1:00 / ASA 2   HERNIA REPAIR     Left Kidney and ureter removal  08/18/2012   POLYPECTOMY  08/24/2016   Procedure: POLYPECTOMY;  Surgeon: Danie Binder, MD;  [facility];  Service: Endoscopy;;  sigmoid  colon,  ascending colon polyp, transverse colon polyp   POLYPECTOMY  01/30/2022   Procedure: POLYPECTOMY;  Surgeon: Eloise Harman, DO;  [facility];  Service: Endoscopy;;   SHOULDER SURGERY      Family Psychiatric History: See below  Family History:  Family History  Problem Relation Age of Onset   Dementia Father    Depression Father    Alcohol abuse Father    Depression Brother    Drug abuse Brother    Alcohol abuse Brother    Anxiety disorder Brother    Depression Sister    Seizures Sister    Bipolar disorder Mother    Bipolar disorder Maternal Aunt    Bipolar disorder Maternal Grandmother    ADD / ADHD Neg Hx    OCD  Neg Hx    Paranoid behavior Neg Hx    Schizophrenia Neg Hx    Sexual abuse Neg Hx    Physical abuse Neg Hx     Social History:  Social History   Socioeconomic History   Marital status: Married    Spouse name: Not on file   Number of children: Not on file   Years of education: Not on file   Highest education level: Not on file  Occupational History   Not on file  Tobacco Use   Smoking status: Never   Smokeless tobacco: Never  Vaping Use   Vaping Use: Never used  Substance and Sexual Activity   Alcohol use: No   Drug use: No   Sexual activity: Not Currently  Other Topics Concern   Not on file  Social History Narrative   Not on file   Social Determinants of Health   Financial  Resource Strain: Not on file  Food Insecurity: Not on file  Transportation Needs: Not on file  Physical Activity: Not on file  Stress: Not on file  Social Connections: Not on file    Allergies:  Allergies  Allergen Reactions   Depakote [Divalproex Sodium] Other (See Comments)    Tremors so bad that she scalded herself twice with coffee.   Lithium Nausea And Vomiting   Reglan [Metoclopramide] Other (See Comments)    Mouth dystonia/distortion   Codeine Nausea And Vomiting   Haloperidol Lactate Other (See Comments)    Make her stiff   Latex Hives    Metabolic Disorder Labs: Lab Results  Component Value Date   HGBA1C 5.7 (H) 10/11/2011   MPG 117 (H) 10/11/2011   No results found for: "PROLACTIN" Lab Results  Component Value Date   CHOL 246 (H) 11/03/2012   TRIG 112 11/03/2012   HDL 53 11/03/2012   CHOLHDL 4.6 11/03/2012   VLDL 22 11/03/2012   LDLCALC 171 (H) 11/03/2012   No results found for: "TSH"  Therapeutic Level Labs: No results found for: "LITHIUM" Lab Results  Component Value Date   VALPROATE 78.4 05/25/2009   VALPROATE 63.3 05/17/2009   No results found for: "CBMZ"  Current Medications: Current Outpatient Medications  Medication Sig Dispense Refill   CALCIUM CITRATE PO Take 600 mg by mouth daily.     Garlic 1308 MG CAPS Take 1,000 mg by mouth daily.     naproxen sodium (ANAPROX) 220 MG tablet Take 440 mg by mouth 2 (two) times daily as needed (for pain.).      Omega-3 Fatty Acids (FISH OIL PO) Take 2,000 mg by mouth daily.     pantoprazole (PROTONIX) 40 MG tablet Take 40 mg by mouth daily.       Vitamin D3 (VITAMIN D) 25 MCG tablet Take 1,000 Units by mouth daily.     benztropine (COGENTIN) 0.5 MG tablet Take 1 tablet (0.5 mg total) by mouth daily. 90 tablet 3   FLUoxetine (PROZAC) 20 MG capsule Take 1 capsule (20 mg total) by mouth daily. 90 capsule 3   OLANZapine (ZYPREXA) 10 MG tablet Take 1 tablet (10 mg total) by mouth at bedtime. 90 tablet 3    Red Yeast Rice 600 MG TABS Take 600 mg by mouth daily. (Patient not taking: Reported on 10/01/2022)     No current facility-administered medications for this visit.     Musculoskeletal: Strength & Muscle Tone: within normal limits Gait & Station: normal Patient leans: N/A  Psychiatric Specialty Exam: Review of Systems  All other systems reviewed and are negative.   Blood pressure 121/76, pulse 86, height '5\' 4"'$  (1.626 m), weight 194 lb (88 kg), SpO2 96 %.Body mass index is 33.3 kg/m.  General Appearance: Casual and Fairly Groomed  Eye Contact:  Good  Speech:  Clear and Coherent  Volume:  Normal  Mood:  Euthymic  Affect:  Appropriate and Congruent  Thought Process:  Goal Directed  Orientation:  Full (Time, Place, and Person)  Thought Content: WDL   Suicidal Thoughts:  No  Homicidal Thoughts:  No  Memory:  Immediate;   Good Recent;   Good Remote;   Good  Judgement:  Good  Insight:  Good  Psychomotor Activity:  Normal  Concentration:  Concentration: Good and Attention Span: Good  Recall:  Good  Fund of Knowledge: Good  Language: Good  Akathisia:  No  Handed:  Right  AIMS (if indicated): not done  Assets:  Communication Skills Desire for Improvement Physical Health Resilience Social Support Talents/Skills  ADL's:  Intact  Cognition: WNL  Sleep:  Good   Screenings: GAD-7    Flowsheet Row Office Visit from 10/01/2022 in [facility]  Total GAD-7 Score 2      PHQ2-9    [place] Office Visit from 10/01/2022 in [facility] Video Visit from 04/03/2022 in [facility] Video Visit from 10/04/2021 in [facility] Video Visit from 05/08/2021 in [facility] Video Visit from 11/21/2020 in [facility] Total Score 0 0 0 0 0   PHQ-9 Total Score 2 -- -- -- --      Flowsheet Row Video Visit from 04/03/2022 in [facility] Admission (Discharged) from 01/30/2022 in [facility] Video Visit from 10/04/2021 in [facility] No Risk No Risk No Risk        Assessment and Plan: This patient is a 67 year old female with a history of bipolar disorder.  She continues to do very well on her current regimen.  She will continue Prozac 20 mg daily for depression, Zyprexa 10 mg daily for mood stabilization and Cogentin 0.5 mg daily to prevent side effects from Zyprexa.  She will return to see me in 6 months  Collaboration of Care: Collaboration of Care: Primary Care Provider AEB notes will be shared with PCP at patient's request  Patient/Guardian was advised Release of Information must be obtained prior to any record release in order to collaborate their care with an outside provider. Patient/Guardian was advised if they have not already done so to contact the registration department to sign all necessary forms in order for [place] to release information regarding their care.   Consent: Patient/Guardian gives verbal consent for treatment and assignment of benefits for services provided during this visit. Patient/Guardian expressed understanding and agreed to proceed.    Levonne Spiller, MD 10/01/2022, 1:28 PM

## 2023-02-05 DIAGNOSIS — Z7189 Other specified counseling: Secondary | ICD-10-CM | POA: Diagnosis not present

## 2023-02-05 DIAGNOSIS — Z299 Encounter for prophylactic measures, unspecified: Secondary | ICD-10-CM | POA: Diagnosis not present

## 2023-02-05 DIAGNOSIS — Z0000 Encounter for general adult medical examination without abnormal findings: Secondary | ICD-10-CM | POA: Diagnosis not present

## 2023-04-01 ENCOUNTER — Ambulatory Visit (HOSPITAL_COMMUNITY): Payer: Medicare Other | Admitting: Psychiatry

## 2023-04-01 ENCOUNTER — Encounter (HOSPITAL_COMMUNITY): Payer: Self-pay | Admitting: Psychiatry

## 2023-04-01 DIAGNOSIS — F319 Bipolar disorder, unspecified: Secondary | ICD-10-CM

## 2023-04-01 MED ORDER — BENZTROPINE MESYLATE 0.5 MG PO TABS: 0.5000 mg | ORAL_TABLET | Freq: Every day | ORAL | 3 refills | Status: DC

## 2023-04-01 MED ORDER — FLUOXETINE HCL 20 MG PO CAPS: 20.0000 mg | ORAL_CAPSULE | Freq: Every day | ORAL | 3 refills | Status: DC

## 2023-04-01 MED ORDER — OLANZAPINE 10 MG PO TABS: 10.0000 mg | ORAL_TABLET | Freq: Every day | ORAL | 3 refills | Status: DC

## 2023-04-01 NOTE — Progress Notes (Signed)
BH MD/PA/NP OP Progress Note  04/01/2023 1:30 PM Amanda Lara  MRN:  [id number]  Chief Complaint:  Chief Complaint  Patient presents with   Depression   Anxiety   Follow-up   HPI: This patient is a 67year-old married white female who lives in [place] with her husband they have no children. 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   The patient states she's had a history of mental illness dating back to age 18. At that time she was hospitalized for severe depression with suicidal ideation. She's had approximately 5 more hospitalizations since then. At times she's been extremely depressed and had been cutting herself 14 years ago prior to her last hospitalization. She's had bad reactions to Haldol and Depakote but is currently doing well on her current regimen  The patient returns for follow-up after 6 months.  She continues to do well.  Her husband had a valve replacement and is tired and she is having to do most of the work around the house but this is kept her busy.  Her mood has been stable and she denies significant depression symptoms or manic symptoms.  She is sleeping well.  Her energy is good.  She has no new health problems.  She denies side effects from medication such as twitching jerking or thoughts of self-harm or suicide. Visit Diagnosis:    ICD-10-CM   1. Bipolar 1 disorder (HCC)  F31.9 FLUoxetine (PROZAC) 20 MG capsule    benztropine (COGENTIN) 0.5 MG tablet      Past Psychiatric History: Several hospitalizations in her younger years  Past Medical History:  Past Medical History:  Diagnosis Date   Arthritis    Bipolar disorder (HCC)    Chronic kidney disease    Depression    GERD (gastroesophageal reflux disease)    History of UTI    Obesity     Past Surgical History:  Procedure Laterality Date   ABDOMINAL HYSTERECTOMY     APPENDECTOMY     COLONOSCOPY N/A 08/24/2016   Procedure: COLONOSCOPY;   Surgeon: West Bali, MD;  [facility];  Service: Endoscopy;  Laterality: N/A;  11:30 AM   COLONOSCOPY WITH PROPOFOL N/A 01/30/2022   Procedure: COLONOSCOPY WITH PROPOFOL;  Surgeon: Lanelle Bal, DO;  [facility];  Service: Endoscopy;  Laterality: N/A;  1:00 / ASA 2   HERNIA REPAIR     Left Kidney and ureter removal  08/18/2012   POLYPECTOMY  08/24/2016   Procedure: POLYPECTOMY;  Surgeon: West Bali, MD;  [facility];  Service: Endoscopy;;  sigmoid  colon,  ascending colon polyp, transverse colon polyp   POLYPECTOMY  01/30/2022   Procedure: POLYPECTOMY;  Surgeon: Lanelle Bal, DO;  [facility];  Service: Endoscopy;;   SHOULDER SURGERY      Family Psychiatric History: see below  Family History:  Family History  Problem Relation Age of Onset   Dementia Father    Depression Father    Alcohol abuse Father    Depression Brother    Drug abuse Brother    Alcohol abuse Brother    Anxiety disorder Brother    Depression Sister    Seizures Sister    Bipolar disorder Mother    Bipolar disorder Maternal Aunt    Bipolar disorder Maternal Grandmother    ADD / ADHD Neg Hx    OCD Neg Hx  Paranoid behavior Neg Hx    Schizophrenia Neg Hx    Sexual abuse Neg Hx    Physical abuse Neg Hx     Social History:  Social History   Socioeconomic History   Marital status: Married    Spouse name: Not on file   Number of children: Not on file   Years of education: Not on file   Highest education level: Not on file  Occupational History   Not on file  Tobacco Use   Smoking status: Never   Smokeless tobacco: Never  Vaping Use   Vaping status: Never Used  Substance and Sexual Activity   Alcohol use: No   Drug use: No   Sexual activity: Not Currently  Other Topics Concern   Not on file  Social History Narrative   Not on file   Social Determinants of Health   Financial Resource Strain: Not on file  Food Insecurity: Not on file   Transportation Needs: Not on file  Physical Activity: Not on file  Stress: Not on file  Social Connections: Not on file    Allergies:  Allergies  Allergen Reactions   Depakote [Divalproex Sodium] Other (See Comments)    Tremors so bad that she scalded herself twice with coffee.   Lithium Nausea And Vomiting   Reglan [Metoclopramide] Other (See Comments)    Mouth dystonia/distortion   Codeine Nausea And Vomiting   Haloperidol Lactate Other (See Comments)    Make her stiff   Latex Hives    Metabolic Disorder Labs: Lab Results  Component Value Date   HGBA1C 5.7 (H) 10/11/2011   MPG 117 (H) 10/11/2011   No results found for: "PROLACTIN" Lab Results  Component Value Date   CHOL 246 (H) 11/03/2012   TRIG 112 11/03/2012   HDL 53 11/03/2012   CHOLHDL 4.6 11/03/2012   VLDL 22 11/03/2012   LDLCALC 171 (H) 11/03/2012   No results found for: "TSH"  Therapeutic Level Labs: No results found for: "LITHIUM" Lab Results  Component Value Date   VALPROATE 78.4 05/25/2009   VALPROATE 63.3 05/17/2009   No results found for: "CBMZ"  Current Medications: Current Outpatient Medications  Medication Sig Dispense Refill   CALCIUM CITRATE PO Take 600 mg by mouth daily.     Garlic 1000 MG CAPS Take 1,000 mg by mouth daily.     naproxen sodium (ANAPROX) 220 MG tablet Take 440 mg by mouth 2 (two) times daily as needed (for pain.).      Omega-3 Fatty Acids (FISH OIL PO) Take 2,000 mg by mouth daily.     pantoprazole (PROTONIX) 40 MG tablet Take 40 mg by mouth daily.       Vitamin D3 (VITAMIN D) 25 MCG tablet Take 1,000 Units by mouth daily.     benztropine (COGENTIN) 0.5 MG tablet Take 1 tablet (0.5 mg total) by mouth daily. 90 tablet 3   FLUoxetine (PROZAC) 20 MG capsule Take 1 capsule (20 mg total) by mouth daily. 90 capsule 3   OLANZapine (ZYPREXA) 10 MG tablet Take 1 tablet (10 mg total) by mouth at bedtime. 90 tablet 3   Red Yeast Rice 600 MG TABS Take 600 mg by mouth daily.  (Patient not taking: Reported on 10/01/2022)     No current facility-administered medications for this visit.     Musculoskeletal: Strength & Muscle Tone: within normal limits Gait & Station: normal Patient leans: N/A  Psychiatric Specialty Exam: Review of Systems  All other systems reviewed and  are negative.   Blood pressure 124/88, pulse 80, temperature 98.3 F (36.8 C), height 5' 3.75" (1.619 m), weight 192 lb (87.1 kg), SpO2 95%.Body mass index is 33.22 kg/m.  General Appearance: Casual and Fairly Groomed  Eye Contact:  Good  Speech:  Clear and Coherent  Volume:  Normal  Mood:  Euthymic  Affect:  Congruent  Thought Process:  Goal Directed  Orientation:  Full (Time, Place, and Person)  Thought Content: WDL   Suicidal Thoughts:  No  Homicidal Thoughts:  No  Memory:  Immediate;   Good Recent;   Good Remote;   Good  Judgement:  Good  Insight:  Good  Psychomotor Activity:  Normal  Concentration:  Concentration: Good and Attention Span: Good  Recall:  Good  Fund of Knowledge: Good  Language: Good  Akathisia:  No  Handed:  Right  AIMS (if indicated): not done  Assets:  Communication Skills Desire for Improvement Physical Health Resilience Social Support  ADL's:  Intact  Cognition: WNL  Sleep:  Good   Screenings: GAD-7    Flowsheet Row Office Visit from 04/01/2023 in [facility] [place] Office Visit from 10/01/2022 in [facility] [place]  Total GAD-7 Score 3 2      PHQ2-9    Flowsheet Row Office Visit from 04/01/2023 in [facility] [place] Office Visit from 10/01/2022 in [facility] [place] Video Visit from 04/03/2022 in [facility] at [place] Video Visit from 10/04/2021 in [facility] at [place] Video Visit from 05/08/2021 in [facility] at  [facility] Total Score 0 0 0 0 0  PHQ-9 Total Score -- 2 -- -- --      Flowsheet Row Video Visit from 04/03/2022 in [facility] at [place] Admission (Discharged) from 01/30/2022 in [facility] Video Visit from 10/04/2021 in [facility] at [place]  C-SSRS RISK CATEGORY No Risk No Risk No Risk        Assessment and Plan:  This patient is a 67 year old female with a history of bipolar disorder.  She continues to do well on her current regimen.  She will continue Prozac 20 mg daily for depression, Zyprexa 10 mg daily for mood stabilization and Cogentin 0.5 mg daily to prevent side effects from Zyprexa.  The patient states Dr. Sherryll Burger checks her labs and everything has been normal.  She will return to see me in 6 months Collaboration of Care: Collaboration of Care: Primary Care Provider AEB notes will be shared with PCP at patient's request  Patient/Guardian was advised Release of Information must be obtained prior to any record release in order to collaborate their care with an outside provider. Patient/Guardian was advised if they have not already done so to contact the registration department to sign all necessary forms in order for [place] to release information regarding their care.   Consent: Patient/Guardian gives verbal consent for treatment and assignment of benefits for services provided during this visit. Patient/Guardian expressed understanding and agreed to proceed.    Diannia Ruder, MD 04/01/2023, 1:30 PM

## 2023-05-07 DIAGNOSIS — Z79899 Other long term (current) drug therapy: Secondary | ICD-10-CM | POA: Diagnosis not present

## 2023-05-07 DIAGNOSIS — E559 Vitamin D deficiency, unspecified: Secondary | ICD-10-CM | POA: Diagnosis not present

## 2023-05-07 DIAGNOSIS — Z0000 Encounter for general adult medical examination without abnormal findings: Secondary | ICD-10-CM | POA: Diagnosis not present

## 2023-05-07 DIAGNOSIS — Z299 Encounter for prophylactic measures, unspecified: Secondary | ICD-10-CM | POA: Diagnosis not present

## 2023-05-07 DIAGNOSIS — E7800 Pure hypercholesterolemia, unspecified: Secondary | ICD-10-CM | POA: Diagnosis not present

## 2023-05-07 DIAGNOSIS — R5383 Other fatigue: Secondary | ICD-10-CM | POA: Diagnosis not present

## 2023-08-27 ENCOUNTER — Other Ambulatory Visit (HOSPITAL_COMMUNITY): Payer: Self-pay | Admitting: Psychiatry

## 2023-08-27 DIAGNOSIS — F319 Bipolar disorder, unspecified: Secondary | ICD-10-CM

## 2023-10-02 ENCOUNTER — Telehealth (HOSPITAL_COMMUNITY): Payer: Medicare Other | Admitting: Psychiatry

## 2023-10-09 ENCOUNTER — Encounter (HOSPITAL_COMMUNITY): Payer: Self-pay | Admitting: Psychiatry

## 2023-10-09 ENCOUNTER — Telehealth (HOSPITAL_COMMUNITY): Payer: HMO | Admitting: Psychiatry

## 2023-10-09 DIAGNOSIS — F319 Bipolar disorder, unspecified: Secondary | ICD-10-CM | POA: Diagnosis not present

## 2023-10-09 MED ORDER — FLUOXETINE HCL 20 MG PO CAPS: 20.0000 mg | ORAL_CAPSULE | Freq: Every day | ORAL | 3 refills | Status: DC

## 2023-10-09 MED ORDER — OLANZAPINE 10 MG PO TABS
10.0000 mg | ORAL_TABLET | Freq: Every day | ORAL | 3 refills | Status: DC
Start: 2023-10-09 — End: 2024-04-07

## 2023-10-09 MED ORDER — BENZTROPINE MESYLATE 0.5 MG PO TABS: 0.5000 mg | ORAL_TABLET | Freq: Every day | ORAL | 3 refills | Status: DC

## 2023-10-09 NOTE — Progress Notes (Signed)
Virtual Visit via Telephone Note  I connected with Amanda Lara on 10/09/23 at  1:40 PM EST by telephone and verified that I am speaking with the correct person using two identifiers.  Location: Patient: home Provider: office   I discussed the limitations, risks, security and privacy concerns of performing an evaluation and management service by telephone and the availability of in person appointments. I also discussed with the patient that there may be a patient responsible charge related to this service. The patient expressed understanding and agreed to proceed.       I discussed the assessment and treatment plan with the patient. The patient was provided an opportunity to ask questions and all were answered. The patient agreed with the plan and demonstrated an understanding of the instructions.   The patient was advised to call back or seek an in-person evaluation if the symptoms worsen or if the condition fails to improve as anticipated.  I provided 20 minutes of non-face-to-face time during this encounter.   Amanda Ruder, MD  [facility] MD/PA/NP OP Progress Note  10/09/2023 1:58 PM Amanda Lara  MRN:  [id number]  Chief Complaint:  Chief Complaint  Patient presents with   Anxiety   Depression   Follow-up   HPI: This patient is a 68year-old married white female who lives in [place] with her husband .they have no children. 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   The patient states she's had a history of mental illness dating back to age 68. At that time she was hospitalized for severe depression with suicidal ideation. She's had approximately 5 more hospitalizations since then. At times she's been extremely depressed and had been cutting herself 14 years ago prior to her last hospitalization. She's had bad reactions to Haldol and Depakote but is currently doing well on her current regimen  The patient returns  for follow-up after 6 months regarding her bipolar disorder.  She continues to do well.  She states that her energy is good and she is doing a lot of projects around her house.  She is sleeping well.  She denies significant depression thoughts of self-harm.  She denies manic symptoms such as racing thoughts or impulsivity.  She denies any new health concerns.  She denies side effects from medication such as twitching or jerking. Visit Diagnosis:    ICD-10-CM   1. Bipolar 1 disorder (HCC)  F31.9 FLUoxetine (PROZAC) 20 MG capsule    benztropine (COGENTIN) 0.5 MG tablet      Past Psychiatric History: Several psychiatric hospitalizations in her younger years  Past Medical History:  Past Medical History:  Diagnosis Date   Arthritis    Bipolar disorder (HCC)    Chronic kidney disease    Depression    GERD (gastroesophageal reflux disease)    History of UTI    Obesity     Past Surgical History:  Procedure Laterality Date   ABDOMINAL HYSTERECTOMY     APPENDECTOMY     COLONOSCOPY N/A 08/24/2016   Procedure: COLONOSCOPY;  Surgeon: West Bali, MD;  [facility];  Service: Endoscopy;  Laterality: N/A;  11:30 AM   COLONOSCOPY WITH PROPOFOL N/A 01/30/2022   Procedure: COLONOSCOPY WITH PROPOFOL;  Surgeon: Lanelle Bal, DO;  [facility];  Service: Endoscopy;  Laterality: N/A;  1:00 / ASA 2   HERNIA REPAIR     Left Kidney and ureter removal  08/18/2012  POLYPECTOMY  08/24/2016   Procedure: POLYPECTOMY;  Surgeon: West Bali, MD;  [facility];  Service: Endoscopy;;  sigmoid  colon,  ascending colon polyp, transverse colon polyp   POLYPECTOMY  01/30/2022   Procedure: POLYPECTOMY;  Surgeon: Lanelle Bal, DO;  [facility];  Service: Endoscopy;;   SHOULDER SURGERY      Family Psychiatric History: See below  Family History:  Family History  Problem Relation Age of Onset   Dementia Father    Depression Father    Alcohol abuse Father     Depression Brother    Drug abuse Brother    Alcohol abuse Brother    Anxiety disorder Brother    Depression Sister    Seizures Sister    Bipolar disorder Mother    Bipolar disorder Maternal Aunt    Bipolar disorder Maternal Grandmother    ADD / ADHD Neg Hx    OCD Neg Hx    Paranoid behavior Neg Hx    Schizophrenia Neg Hx    Sexual abuse Neg Hx    Physical abuse Neg Hx     Social History:  Social History   Socioeconomic History   Marital status: Married    Spouse name: Not on file   Number of children: Not on file   Years of education: Not on file   Highest education level: Not on file  Occupational History   Not on file  Tobacco Use   Smoking status: Never   Smokeless tobacco: Never  Vaping Use   Vaping status: Never Used  Substance and Sexual Activity   Alcohol use: No   Drug use: No   Sexual activity: Not Currently  Other Topics Concern   Not on file  Social History Narrative   Not on file   Social Drivers of Health   Financial Resource Strain: Not on file  Food Insecurity: Not on file  Transportation Needs: Not on file  Physical Activity: Not on file  Stress: Not on file  Social Connections: Not on file    Allergies:  Allergies  Allergen Reactions   Depakote [Divalproex Sodium] Other (See Comments)    Tremors so bad that she scalded herself twice with coffee.   Lithium Nausea And Vomiting   Reglan [Metoclopramide] Other (See Comments)    Mouth dystonia/distortion   Codeine Nausea And Vomiting   Haloperidol Lactate Other (See Comments)    Make her stiff   Latex Hives    Metabolic Disorder Labs: Lab Results  Component Value Date   HGBA1C 5.7 (H) 10/11/2011   MPG 117 (H) 10/11/2011   No results found for: "PROLACTIN" Lab Results  Component Value Date   CHOL 246 (H) 11/03/2012   TRIG 112 11/03/2012   HDL 53 11/03/2012   CHOLHDL 4.6 11/03/2012   VLDL 22 11/03/2012   LDLCALC 171 (H) 11/03/2012   No results found for:  "TSH"  Therapeutic Level Labs: No results found for: "LITHIUM" Lab Results  Component Value Date   VALPROATE 78.4 05/25/2009   VALPROATE 63.3 05/17/2009   No results found for: "CBMZ"  Current Medications: Current Outpatient Medications  Medication Sig Dispense Refill   benztropine (COGENTIN) 0.5 MG tablet Take 1 tablet (0.5 mg total) by mouth at bedtime. 90 tablet 3   CALCIUM CITRATE PO Take 600 mg by mouth daily.     FLUoxetine (PROZAC) 20 MG capsule Take 1 capsule (20 mg total) by mouth daily. 90 capsule 3   Garlic 1000  MG CAPS Take 1,000 mg by mouth daily.     naproxen sodium (ANAPROX) 220 MG tablet Take 440 mg by mouth 2 (two) times daily as needed (for pain.).      OLANZapine (ZYPREXA) 10 MG tablet Take 1 tablet (10 mg total) by mouth at bedtime. 90 tablet 3   Omega-3 Fatty Acids (FISH OIL PO) Take 2,000 mg by mouth daily.     pantoprazole (PROTONIX) 40 MG tablet Take 40 mg by mouth daily.       Red Yeast Rice 600 MG TABS Take 600 mg by mouth daily. (Patient not taking: Reported on 10/01/2022)     Vitamin D3 (VITAMIN D) 25 MCG tablet Take 1,000 Units by mouth daily.     No current facility-administered medications for this visit.     Musculoskeletal: Strength & Muscle Tone: na Gait & Station: na Patient leans: N/A  Psychiatric Specialty Exam: Review of Systems  All other systems reviewed and are negative.   There were no vitals taken for this visit.There is no height or weight on file to calculate BMI.  General Appearance: NA  Eye Contact:  NA  Speech:  Clear and Coherent  Volume:  Normal  Mood:  Euthymic  Affect:  Congruent  Thought Process:  Goal Directed  Orientation:  Full (Time, Place, and Person)  Thought Content: WDL   Suicidal Thoughts:  No  Homicidal Thoughts:  No  Memory:  Immediate;   Good Recent;   Good Remote;   Good  Judgement:  Good  Insight:  Good  Psychomotor Activity:  Normal  Concentration:  Concentration: Good and Attention Span: Good   Recall:  Good  Fund of Knowledge: Good  Language: Good  Akathisia:  No  Handed:  Right  AIMS (if indicated): not done  Assets:  Communication Skills Desire for Improvement Physical Health Resilience Social Support Talents/Skills  ADL's:  Intact  Cognition: WNL  Sleep:  Good   Screenings: GAD-7    Flowsheet Row Office Visit from 04/01/2023 in [facility] [place] Office Visit from 10/01/2022 in [facility] [place]  Total GAD-7 Score 3 2      PHQ2-9    Flowsheet Row Office Visit from 04/01/2023 in [facility] [place] Office Visit from 10/01/2022 in [facility] [place] Video Visit from 04/03/2022 in [facility] at [place] Video Visit from 10/04/2021 in [facility] at [place] Video Visit from 05/08/2021 in [facility] Total Score 0 0 0 0 0  PHQ-9 Total Score -- 2 -- -- --      Flowsheet Row Video Visit from 04/03/2022 in [facility] [place] Admission (Discharged) from 01/30/2022 in [facility] Video Visit from 10/04/2021 in [facility] at [place]  C-SSRS RISK CATEGORY No Risk No Risk No Risk        Assessment and Plan: This patient is a 68 year old female with a history of bipolar disorder.  She continues to do well on her current regimen.  She will continue Prozac 20 mg daily for depression, Zyprexa 10 mg daily for mood stabilization and Cogentin 0.5 mg daily to prevent side effects from Zyprexa.  She is followed closely by her PCP in terms of lab work.  She will return to see me in 6 months  Collaboration of Care: Collaboration of Care: Primary  Care Provider AEB notes to be shared with PCP at patient's request  Patient/Guardian was advised Release of Information must be  obtained prior to any record release in order to collaborate their care with an outside provider. Patient/Guardian was advised if they have not already done so to contact the registration department to sign all necessary forms in order for [place] to release information regarding their care.   Consent: Patient/Guardian gives verbal consent for treatment and assignment of benefits for services provided during this visit. Patient/Guardian expressed understanding and agreed to proceed.    Amanda Ruder, MD 10/09/2023, 1:58 PM

## 2023-11-05 DIAGNOSIS — Z0000 Encounter for general adult medical examination without abnormal findings: Secondary | ICD-10-CM | POA: Diagnosis not present

## 2023-11-05 DIAGNOSIS — Z299 Encounter for prophylactic measures, unspecified: Secondary | ICD-10-CM | POA: Diagnosis not present

## 2023-11-05 DIAGNOSIS — Z7189 Other specified counseling: Secondary | ICD-10-CM | POA: Diagnosis not present

## 2023-11-05 DIAGNOSIS — N1831 Chronic kidney disease, stage 3a: Secondary | ICD-10-CM | POA: Diagnosis not present

## 2023-11-20 ENCOUNTER — Inpatient Hospital Stay: Admission: RE | Admit: 2023-11-20 | Source: Ambulatory Visit

## 2023-11-20 ENCOUNTER — Other Ambulatory Visit: Payer: Self-pay | Admitting: Internal Medicine

## 2023-11-20 DIAGNOSIS — Z1231 Encounter for screening mammogram for malignant neoplasm of breast: Secondary | ICD-10-CM

## 2023-11-26 ENCOUNTER — Ambulatory Visit
Admission: RE | Admit: 2023-11-26 | Discharge: 2023-11-26 | Disposition: A | Discharge status: Home / Self Care | Source: Ambulatory Visit | Attending: Internal Medicine | Admitting: Internal Medicine

## 2023-11-26 DIAGNOSIS — Z1231 Encounter for screening mammogram for malignant neoplasm of breast: Secondary | ICD-10-CM

## 2023-11-29 ENCOUNTER — Other Ambulatory Visit: Payer: Self-pay | Admitting: Internal Medicine

## 2023-11-29 DIAGNOSIS — R928 Other abnormal and inconclusive findings on diagnostic imaging of breast: Secondary | ICD-10-CM

## 2023-12-20 ENCOUNTER — Ambulatory Visit
Admission: RE | Admit: 2023-12-20 | Discharge: 2023-12-20 | Disposition: A | Discharge status: Home / Self Care | Source: Ambulatory Visit | Attending: Internal Medicine | Admitting: Internal Medicine

## 2023-12-20 DIAGNOSIS — N6002 Solitary cyst of left breast: Secondary | ICD-10-CM | POA: Diagnosis not present

## 2023-12-20 DIAGNOSIS — R928 Other abnormal and inconclusive findings on diagnostic imaging of breast: Secondary | ICD-10-CM

## 2023-12-20 DIAGNOSIS — N6323 Unspecified lump in the left breast, lower outer quadrant: Secondary | ICD-10-CM | POA: Diagnosis not present

## 2024-04-07 ENCOUNTER — Encounter (HOSPITAL_COMMUNITY): Payer: Self-pay | Admitting: Psychiatry

## 2024-04-07 ENCOUNTER — Telehealth (HOSPITAL_COMMUNITY): Payer: Medicare Other | Admitting: Psychiatry

## 2024-04-07 DIAGNOSIS — F319 Bipolar disorder, unspecified: Secondary | ICD-10-CM

## 2024-04-07 MED ORDER — BENZTROPINE MESYLATE 0.5 MG PO TABS: 0.5000 mg | ORAL_TABLET | Freq: Every day | ORAL | 3 refills | Status: AC

## 2024-04-07 MED ORDER — FLUOXETINE HCL 20 MG PO CAPS: 20.0000 mg | ORAL_CAPSULE | Freq: Every day | ORAL | 3 refills | Status: AC

## 2024-04-07 MED ORDER — OLANZAPINE 10 MG PO TABS
10.0000 mg | ORAL_TABLET | Freq: Every day | ORAL | 3 refills | Status: AC
Start: 2024-04-07 — End: ?

## 2024-04-07 NOTE — Progress Notes (Signed)
 Virtual Visit via Telephone Note  I connected with Amanda Lara on 04/07/24 at  1:00 PM EDT by telephone and verified that I am speaking with the correct person using two identifiers.  Location: Patient: home Provider: office   I discussed the limitations, risks, security and privacy concerns of performing an evaluation and management service by telephone and the availability of in person appointments. I also discussed with the patient that there may be a patient responsible charge related to this service. The patient expressed understanding and agreed to proceed.      I discussed the assessment and treatment plan with the patient. The patient was provided an opportunity to ask questions and all were answered. The patient agreed with the plan and demonstrated an understanding of the instructions.   The patient was advised to call back or seek an in-person evaluation if the symptoms worsen or if the condition fails to improve as anticipated.  I provided 20 minutes of non-face-to-face time during this encounter.   Barnie Gull, MD  [facility] MD/PA/NP OP Progress Note  04/07/2024 1:06 PM Amanda Lara  MRN:  [id number]  Chief Complaint:  Chief Complaint  Patient presents with   Depression   Anxiety   Follow-up   HPI: : This patient is a 68year-old married white female who lives in [place] with her husband .they have no children. 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   The patient states she's had a history of mental illness dating back to age 37. At that time she was hospitalized for severe depression with suicidal ideation. She's had approximately 5 more hospitalizations since then. At times she's been extremely depressed and had been cutting herself 14 years ago prior to her last hospitalization. She's had bad reactions to Haldol and Depakote but is currently doing well on her current regimen  The patient returns  for follow-up after 6 months regarding her bipolar disorder.  She states that she has been doing well.  She had her husband's young cousins visiting a lot this summer.  She is staying busy and active.  She denies depression and anxiety thoughts of self-harm or suicide.  She denies difficulty sleeping.  She denies manic symptoms such as racing thoughts or impulsivity's.  She denies any new health concerns.  She denies side effects from medication such as twitching or jerking. Visit Diagnosis:    ICD-10-CM   1. Bipolar 1 disorder (HCC)  F31.9 FLUoxetine  (PROZAC ) 20 MG capsule    benztropine  (COGENTIN ) 0.5 MG tablet      Past Psychiatric History: Several psychiatric  hospitalizations in her younger years  Past Medical History:  Past Medical History:  Diagnosis Date   Arthritis    Bipolar disorder (HCC)    Chronic kidney disease    Depression    GERD (gastroesophageal reflux disease)    History of UTI    Obesity     Past Surgical History:  Procedure Laterality Date   ABDOMINAL HYSTERECTOMY     APPENDECTOMY     COLONOSCOPY N/A 08/24/2016   Procedure: COLONOSCOPY;  Surgeon: Margo LITTIE Haddock, MD;  [facility];  Service: Endoscopy;  Laterality: N/A;  11:30 AM   COLONOSCOPY WITH PROPOFOL  N/A 01/30/2022   Procedure: COLONOSCOPY WITH PROPOFOL ;  Surgeon: Cindie Carlin POUR, DO;  [facility]: [facility];  Service: Endoscopy;  Laterality: N/A;  1:00 / ASA 2   HERNIA REPAIR  Left Kidney and ureter removal  08/18/2012   POLYPECTOMY  08/24/2016   Procedure: POLYPECTOMY;  Surgeon: Margo LITTIE Haddock, MD;  [facility];  Service: Endoscopy;;  sigmoid  colon,  ascending colon polyp, transverse colon polyp   POLYPECTOMY  01/30/2022   Procedure: POLYPECTOMY;  Surgeon: Cindie Carlin POUR, DO;  [facility];  Service: Endoscopy;;   SHOULDER SURGERY      Family Psychiatric History: See below  Family History:  Family History  Problem Relation Age of Onset   Dementia Father     Depression Father    Alcohol abuse Father    Depression Brother    Drug abuse Brother    Alcohol abuse Brother    Anxiety disorder Brother    Depression Sister    Seizures Sister    Bipolar disorder Mother    Bipolar disorder Maternal Aunt    Bipolar disorder Maternal Grandmother    ADD / ADHD Neg Hx    OCD Neg Hx    Paranoid behavior Neg Hx    Schizophrenia Neg Hx    Sexual abuse Neg Hx    Physical abuse Neg Hx     Social History:  Social History   Socioeconomic History   Marital status: Married    Spouse name: Not on file   Number of children: Not on file   Years of education: Not on file   Highest education level: Not on file  Occupational History   Not on file  Tobacco Use   Smoking status: Never   Smokeless tobacco: Never  Vaping Use   Vaping status: Never Used  Substance and Sexual Activity   Alcohol use: No   Drug use: No   Sexual activity: Not Currently  Other Topics Concern   Not on file  Social History Narrative   Not on file   Social Drivers of Health   Financial Resource Strain: Not on file  Food Insecurity: Not on file  Transportation Needs: Not on file  Physical Activity: Not on file  Stress: Not on file  Social Connections: Not on file    Allergies:  Allergies  Allergen Reactions   Depakote [Divalproex Sodium] Other (See Comments)    Tremors so bad that she scalded herself twice with coffee.   Lithium Nausea And Vomiting   Reglan [Metoclopramide] Other (See Comments)    Mouth dystonia/distortion   Codeine Nausea And Vomiting   Haloperidol Lactate Other (See Comments)    Make her stiff   Latex Hives    Metabolic Disorder Labs: Lab Results  Component Value Date   HGBA1C 5.7 (H) 10/11/2011   MPG 117 (H) 10/11/2011   No results found for: PROLACTIN Lab Results  Component Value Date   CHOL 246 (H) 11/03/2012   TRIG 112 11/03/2012   HDL 53 11/03/2012   CHOLHDL 4.6 11/03/2012   VLDL 22 11/03/2012   LDLCALC 171 (H)  11/03/2012   No results found for: TSH  Therapeutic Level Labs: No results found for: LITHIUM Lab Results  Component Value Date   VALPROATE 78.4 05/25/2009   VALPROATE 63.3 05/17/2009   No results found for: CBMZ  Current Medications: Current Outpatient Medications  Medication Sig Dispense Refill   benztropine  (COGENTIN ) 0.5 MG tablet Take 1 tablet (0.5 mg total) by mouth at bedtime. 90 tablet 3   CALCIUM CITRATE PO Take 600 mg by mouth daily.     FLUoxetine  (PROZAC ) 20 MG capsule Take 1 capsule (20 mg total) by  mouth daily. 90 capsule 3   Garlic 1000 MG CAPS Take 1,000 mg by mouth daily.     naproxen sodium (ANAPROX) 220 MG tablet Take 440 mg by mouth 2 (two) times daily as needed (for pain.).      OLANZapine  (ZYPREXA ) 10 MG tablet Take 1 tablet (10 mg total) by mouth at bedtime. 90 tablet 3   Omega-3 Fatty Acids (FISH OIL PO) Take 2,000 mg by mouth daily.     pantoprazole (PROTONIX) 40 MG tablet Take 40 mg by mouth daily.       Red Yeast Rice 600 MG TABS Take 600 mg by mouth daily. (Patient not taking: Reported on 10/01/2022)     Vitamin D3 (VITAMIN D) 25 MCG tablet Take 1,000 Units by mouth daily.     No current facility-administered medications for this visit.     Musculoskeletal: Strength & Muscle Tone: na Gait & Station: na Patient leans: N/A  Psychiatric Specialty Exam: Review of Systems  All other systems reviewed and are negative.   There were no vitals taken for this visit.There is no height or weight on file to calculate BMI.  General Appearance: NA  Eye Contact:  NA  Speech:  Clear and Coherent  Volume:  Normal  Mood:  Euthymic  Affect:  NA  Thought Process:  Goal Directed  Orientation:  Full (Time, Place, and Person)  Thought Content: WDL   Suicidal Thoughts:  No  Homicidal Thoughts:  No  Memory:  Immediate;   Good Recent;   Good Remote;   NA  Judgement:  Good  Insight:  Good  Psychomotor Activity:  Normal  Concentration:  Concentration: Good  and Attention Span: Good  Recall:  Good  Fund of Knowledge: Good  Language: Good  Akathisia:  No  Handed:  Right  AIMS (if indicated): not done  Assets:  Communication Skills Desire for Improvement Physical Health Resilience Social Support Talents/Skills  ADL's:  Intact  Cognition: WNL  Sleep:  Good   Screenings: GAD-7    Flowsheet Row Office Visit from 04/01/2023 in [facility] [place] Office Visit from 10/01/2022 in [facility] [place]  Total GAD-7 Score 3 2   PHQ2-9    Flowsheet Row Office Visit from 04/01/2023 in [facility] [place] Office Visit from 10/01/2022 in [facility] [place] Video Visit from 04/03/2022 in [facility] [place] Video Visit from 10/04/2021 in [facility] [place] Video Visit from 05/08/2021 in [facility] Total Score 0 0 0 0 0  PHQ-9 Total Score -- 2 -- -- --   Flowsheet Row Video Visit from 04/03/2022 in [facility] [place] Admission (Discharged) from 01/30/2022 in [facility] Video Visit from 10/04/2021 in [facility] [place]  C-SSRS RISK CATEGORY No Risk No Risk No Risk     Assessment and Plan: This patient is a 68 year old female with a history of bipolar disorder.  She continues to do well on her current regimen.  She will continue Prozac  20 mg daily for depression, Zyprexa  10 mg daily for mood stabilization and Cogentin  0.5 mg daily to prevent side effects from Zyprexa .  She will return to see me in 6 months  Collaboration of Care: Collaboration of Care: Primary Care Provider AEB notes will be shared up with PCP at patient's request  Patient/Guardian was advised Release of Information must be obtained prior to any record release in  order to collaborate their care with an outside provider. Patient/Guardian was advised if they have not already done so to contact the registration department to sign all necessary forms in order for us  to release information regarding their care.   Consent: Patient/Guardian gives verbal consent for treatment and assignment of benefits for services provided during this visit. Patient/Guardian expressed understanding and agreed to proceed.    Barnie Gull, MD 04/07/2024, 1:06 PM

## 2024-04-16 DIAGNOSIS — N1831 Chronic kidney disease, stage 3a: Secondary | ICD-10-CM | POA: Diagnosis not present

## 2024-04-16 DIAGNOSIS — K219 Gastro-esophageal reflux disease without esophagitis: Secondary | ICD-10-CM | POA: Diagnosis not present

## 2024-04-16 DIAGNOSIS — Z299 Encounter for prophylactic measures, unspecified: Secondary | ICD-10-CM | POA: Diagnosis not present

## 2024-04-16 DIAGNOSIS — E7800 Pure hypercholesterolemia, unspecified: Secondary | ICD-10-CM | POA: Diagnosis not present

## 2024-04-16 DIAGNOSIS — Z713 Dietary counseling and surveillance: Secondary | ICD-10-CM | POA: Diagnosis not present

## 2024-04-16 DIAGNOSIS — Z0000 Encounter for general adult medical examination without abnormal findings: Secondary | ICD-10-CM | POA: Diagnosis not present

## 2024-04-16 DIAGNOSIS — R5383 Other fatigue: Secondary | ICD-10-CM | POA: Diagnosis not present

## 2024-04-16 DIAGNOSIS — Z79899 Other long term (current) drug therapy: Secondary | ICD-10-CM | POA: Diagnosis not present

## 2024-10-08 ENCOUNTER — Telehealth (HOSPITAL_COMMUNITY): Admitting: Psychiatry
